# Patient Record
Sex: Female | Born: 1970 | Race: Black or African American | Hispanic: No | Marital: Single | Smoking: Never smoker
Health system: Southern US, Community
[De-identification: ages and names within clinical notes are randomized; demographics above are authoritative.]

## PROBLEM LIST (undated history)

## (undated) DIAGNOSIS — I10 Essential (primary) hypertension: Secondary | ICD-10-CM

## (undated) DIAGNOSIS — E119 Type 2 diabetes mellitus without complications: Secondary | ICD-10-CM

## (undated) HISTORY — PX: CHOLECYSTECTOMY: SHX55

## (undated) HISTORY — PX: TUBAL LIGATION: SHX77

---

## 1998-01-16 ENCOUNTER — Ambulatory Visit (HOSPITAL_COMMUNITY): Admission: RE | Admit: 1998-01-16 | Discharge: 1998-01-16 | Payer: Self-pay | Admitting: *Deleted

## 1999-06-10 ENCOUNTER — Other Ambulatory Visit: Admission: RE | Admit: 1999-06-10 | Discharge: 1999-06-10 | Payer: Self-pay | Admitting: Obstetrics & Gynecology

## 2001-04-12 ENCOUNTER — Encounter: Admission: RE | Admit: 2001-04-12 | Discharge: 2001-04-12 | Payer: Self-pay | Admitting: Family Medicine

## 2001-04-27 ENCOUNTER — Encounter: Admission: RE | Admit: 2001-04-27 | Discharge: 2001-04-27 | Payer: Self-pay | Admitting: Family Medicine

## 2001-08-30 ENCOUNTER — Encounter: Admission: RE | Admit: 2001-08-30 | Discharge: 2001-08-30 | Payer: Self-pay | Admitting: Family Medicine

## 2001-09-08 ENCOUNTER — Encounter: Admission: RE | Admit: 2001-09-08 | Discharge: 2001-09-08 | Payer: Self-pay | Admitting: Family Medicine

## 2002-04-10 ENCOUNTER — Other Ambulatory Visit: Admission: RE | Admit: 2002-04-10 | Discharge: 2002-04-10 | Payer: Self-pay | Admitting: Family Medicine

## 2002-04-10 ENCOUNTER — Encounter: Admission: RE | Admit: 2002-04-10 | Discharge: 2002-04-10 | Payer: Self-pay | Admitting: Family Medicine

## 2002-07-27 ENCOUNTER — Encounter: Admission: RE | Admit: 2002-07-27 | Discharge: 2002-07-27 | Payer: Self-pay | Admitting: Family Medicine

## 2002-08-21 ENCOUNTER — Encounter: Admission: RE | Admit: 2002-08-21 | Discharge: 2002-08-21 | Payer: Self-pay | Admitting: Family Medicine

## 2004-01-30 ENCOUNTER — Encounter: Admission: RE | Admit: 2004-01-30 | Discharge: 2004-01-30 | Payer: Self-pay | Admitting: Family Medicine

## 2004-11-06 ENCOUNTER — Other Ambulatory Visit: Admission: RE | Admit: 2004-11-06 | Discharge: 2004-11-06 | Payer: Self-pay | Admitting: Family Medicine

## 2004-11-06 ENCOUNTER — Ambulatory Visit: Payer: Self-pay | Admitting: Family Medicine

## 2005-09-23 ENCOUNTER — Ambulatory Visit: Payer: Self-pay | Admitting: Sports Medicine

## 2005-10-21 ENCOUNTER — Ambulatory Visit: Payer: Self-pay | Admitting: Sports Medicine

## 2005-10-28 ENCOUNTER — Encounter (INDEPENDENT_AMBULATORY_CARE_PROVIDER_SITE_OTHER): Payer: Self-pay | Admitting: *Deleted

## 2005-11-24 ENCOUNTER — Other Ambulatory Visit: Admission: RE | Admit: 2005-11-24 | Discharge: 2005-11-24 | Payer: Self-pay | Admitting: Family Medicine

## 2005-11-24 ENCOUNTER — Ambulatory Visit: Payer: Self-pay | Admitting: Family Medicine

## 2005-12-24 ENCOUNTER — Ambulatory Visit: Payer: Self-pay | Admitting: Family Medicine

## 2005-12-29 ENCOUNTER — Ambulatory Visit (HOSPITAL_COMMUNITY): Admission: RE | Admit: 2005-12-29 | Discharge: 2005-12-29 | Payer: Self-pay | Admitting: Family Medicine

## 2006-11-23 ENCOUNTER — Ambulatory Visit: Payer: Self-pay | Admitting: Family Medicine

## 2006-11-24 DIAGNOSIS — E669 Obesity, unspecified: Secondary | ICD-10-CM

## 2006-11-25 ENCOUNTER — Encounter (INDEPENDENT_AMBULATORY_CARE_PROVIDER_SITE_OTHER): Payer: Self-pay | Admitting: *Deleted

## 2006-12-28 ENCOUNTER — Encounter (INDEPENDENT_AMBULATORY_CARE_PROVIDER_SITE_OTHER): Payer: Self-pay | Admitting: Specialist

## 2006-12-28 ENCOUNTER — Ambulatory Visit: Payer: Self-pay | Admitting: Family Medicine

## 2006-12-28 ENCOUNTER — Other Ambulatory Visit: Admission: RE | Admit: 2006-12-28 | Discharge: 2006-12-28 | Payer: Self-pay | Admitting: Family Medicine

## 2006-12-28 ENCOUNTER — Encounter (INDEPENDENT_AMBULATORY_CARE_PROVIDER_SITE_OTHER): Payer: Self-pay | Admitting: Family Medicine

## 2006-12-28 DIAGNOSIS — I10 Essential (primary) hypertension: Secondary | ICD-10-CM

## 2006-12-28 LAB — CONVERTED CEMR LAB: GC Probe Amp, Genital: NEGATIVE

## 2006-12-29 ENCOUNTER — Encounter (INDEPENDENT_AMBULATORY_CARE_PROVIDER_SITE_OTHER): Payer: Self-pay | Admitting: Family Medicine

## 2007-01-17 ENCOUNTER — Encounter (INDEPENDENT_AMBULATORY_CARE_PROVIDER_SITE_OTHER): Payer: Self-pay | Admitting: Family Medicine

## 2007-02-07 ENCOUNTER — Ambulatory Visit: Payer: Self-pay | Admitting: Family Medicine

## 2007-02-07 ENCOUNTER — Telehealth: Payer: Self-pay | Admitting: *Deleted

## 2007-02-07 DIAGNOSIS — D259 Leiomyoma of uterus, unspecified: Secondary | ICD-10-CM

## 2007-02-08 ENCOUNTER — Encounter (INDEPENDENT_AMBULATORY_CARE_PROVIDER_SITE_OTHER): Payer: Self-pay | Admitting: Family Medicine

## 2007-02-10 ENCOUNTER — Encounter: Admission: RE | Admit: 2007-02-10 | Discharge: 2007-02-10 | Payer: Self-pay | Admitting: Family Medicine

## 2007-02-17 ENCOUNTER — Telehealth (INDEPENDENT_AMBULATORY_CARE_PROVIDER_SITE_OTHER): Payer: Self-pay | Admitting: Family Medicine

## 2007-03-15 ENCOUNTER — Ambulatory Visit: Payer: Self-pay | Admitting: Family Medicine

## 2007-06-08 ENCOUNTER — Emergency Department (HOSPITAL_COMMUNITY): Admission: EM | Admit: 2007-06-08 | Discharge: 2007-06-08 | Payer: Self-pay | Admitting: Emergency Medicine

## 2008-01-08 ENCOUNTER — Other Ambulatory Visit: Admission: RE | Admit: 2008-01-08 | Discharge: 2008-01-08 | Payer: Self-pay | Admitting: Family Medicine

## 2008-01-08 ENCOUNTER — Encounter: Payer: Self-pay | Admitting: Family Medicine

## 2008-01-08 ENCOUNTER — Ambulatory Visit: Payer: Self-pay | Admitting: Sports Medicine

## 2008-01-09 DIAGNOSIS — E739 Lactose intolerance, unspecified: Secondary | ICD-10-CM

## 2008-01-09 DIAGNOSIS — D509 Iron deficiency anemia, unspecified: Secondary | ICD-10-CM | POA: Insufficient documentation

## 2008-01-09 LAB — CONVERTED CEMR LAB
CO2: 24 meq/L (ref 19–32)
Cholesterol: 171 mg/dL (ref 0–200)
Creatinine, Ser: 0.81 mg/dL (ref 0.40–1.20)
HDL: 51 mg/dL (ref >39)
Hemoglobin: 10.4 g/dL — ABNORMAL LOW (ref 12.0–15.0)
MCHC: 32.7 g/dL (ref 30.0–36.0)
MCV: 76.1 fL — ABNORMAL LOW (ref 78.0–100.0)
Platelets: 367 10*3/uL (ref 150–400)
Potassium: 4.4 meq/L (ref 3.5–5.3)
RBC: 4.18 M/uL (ref 3.87–5.11)
RDW: 15.9 % — ABNORMAL HIGH (ref 11.5–15.5)
Total CHOL/HDL Ratio: 3.4
VLDL: 14 mg/dL (ref 0–40)
WBC: 7.5 10*3/uL (ref 4.0–10.5)

## 2008-01-11 ENCOUNTER — Encounter: Payer: Self-pay | Admitting: Family Medicine

## 2008-01-11 LAB — CONVERTED CEMR LAB: Pap Smear: NORMAL

## 2008-01-12 ENCOUNTER — Encounter: Admission: RE | Admit: 2008-01-12 | Discharge: 2008-01-12 | Payer: Self-pay | Admitting: Family Medicine

## 2008-01-12 ENCOUNTER — Telehealth: Payer: Self-pay | Admitting: Family Medicine

## 2008-01-16 ENCOUNTER — Encounter: Payer: Self-pay | Admitting: Family Medicine

## 2008-02-13 ENCOUNTER — Ambulatory Visit: Payer: Self-pay | Admitting: Family Medicine

## 2008-02-13 ENCOUNTER — Encounter: Payer: Self-pay | Admitting: Family Medicine

## 2008-02-22 ENCOUNTER — Encounter: Payer: Self-pay | Admitting: Family Medicine

## 2008-09-03 ENCOUNTER — Telehealth: Payer: Self-pay | Admitting: *Deleted

## 2008-09-04 ENCOUNTER — Ambulatory Visit: Payer: Self-pay | Admitting: Family Medicine

## 2008-09-17 ENCOUNTER — Ambulatory Visit: Payer: Self-pay | Admitting: Family Medicine

## 2008-10-03 ENCOUNTER — Ambulatory Visit: Payer: Self-pay | Admitting: Family Medicine

## 2009-01-24 ENCOUNTER — Encounter: Payer: Self-pay | Admitting: Family Medicine

## 2009-01-24 ENCOUNTER — Ambulatory Visit: Payer: Self-pay | Admitting: Family Medicine

## 2009-01-24 LAB — CONVERTED CEMR LAB
Blood in Urine, dipstick: NEGATIVE
GC Probe Amp, Genital: NEGATIVE
Glucose, Urine, Semiquant: NEGATIVE
Hgb A1c MFr Bld: 6.3 %
Nitrite: NEGATIVE
Protein, U semiquant: NEGATIVE
Specific Gravity, Urine: >=1.03
Urobilinogen, UA: 0.2

## 2009-02-12 ENCOUNTER — Encounter: Payer: Self-pay | Admitting: Family Medicine

## 2009-02-12 ENCOUNTER — Other Ambulatory Visit: Admission: RE | Admit: 2009-02-12 | Discharge: 2009-02-12 | Payer: Self-pay | Admitting: Family Medicine

## 2009-02-12 ENCOUNTER — Ambulatory Visit: Payer: Self-pay | Admitting: Family Medicine

## 2009-02-13 LAB — CONVERTED CEMR LAB
AST: 10 units/L (ref 0–37)
Alkaline Phosphatase: 51 units/L (ref 39–117)
BUN: 11 mg/dL (ref 6–23)
Chloride: 105 meq/L (ref 96–112)
Creatinine, Ser: 0.89 mg/dL (ref 0.40–1.20)
Glucose, Bld: 96 mg/dL (ref 70–99)
Sodium: 140 meq/L (ref 135–145)
Total Protein: 7.5 g/dL (ref 6.0–8.3)

## 2009-07-21 ENCOUNTER — Telehealth: Payer: Self-pay | Admitting: Family Medicine

## 2009-08-08 ENCOUNTER — Telehealth: Payer: Self-pay | Admitting: *Deleted

## 2009-10-16 ENCOUNTER — Encounter: Payer: Self-pay | Admitting: Family Medicine

## 2009-10-16 ENCOUNTER — Ambulatory Visit: Payer: Self-pay | Admitting: Family Medicine

## 2009-10-16 DIAGNOSIS — N760 Acute vaginitis: Secondary | ICD-10-CM | POA: Insufficient documentation

## 2009-10-16 LAB — CONVERTED CEMR LAB
Nitrite: NEGATIVE
Protein, U semiquant: NEGATIVE
WBC Urine, dipstick: NEGATIVE
Whiff Test: NEGATIVE
pH: 5.5

## 2010-03-03 ENCOUNTER — Ambulatory Visit: Payer: Self-pay | Admitting: Family Medicine

## 2010-03-03 ENCOUNTER — Encounter: Payer: Self-pay | Admitting: Family Medicine

## 2010-03-03 DIAGNOSIS — R5381 Other malaise: Secondary | ICD-10-CM

## 2010-03-03 DIAGNOSIS — R5383 Other fatigue: Secondary | ICD-10-CM

## 2010-03-03 LAB — CONVERTED CEMR LAB
AST: 10 units/L (ref 0–37)
Alkaline Phosphatase: 57 units/L (ref 39–117)
BUN: 16 mg/dL (ref 6–23)
CO2: 26 meq/L (ref 19–32)
Chlamydia, DNA Probe: NEGATIVE
Chloride: 97 meq/L (ref 96–112)
Creatinine, Ser: 0.94 mg/dL (ref 0.40–1.20)
Glucose, Bld: 143 mg/dL — ABNORMAL HIGH (ref 70–99)
TSH: 1.364 microintl units/mL (ref 0.350–4.500)
Total Protein: 8 g/dL (ref 6.0–8.3)

## 2010-03-11 ENCOUNTER — Ambulatory Visit: Payer: Self-pay | Admitting: Family Medicine

## 2010-09-11 ENCOUNTER — Encounter: Payer: Self-pay | Admitting: Family Medicine

## 2010-10-29 NOTE — Assessment & Plan Note (Signed)
Summary: CPE/KH   Vital Signs:  Patient profile:   40 year old female Height:      64 inches Weight:      244.9 pounds BMI:     42.19 Temp:     98.5 degrees F oral Pulse rate:   85 / minute BP sitting:   124 / 85  (left arm) Cuff size:   large  Vitals Entered By: Garen Grams LPN (March 03, 1609 3:31 PM) CC: cpe Is Patient Diabetic? No Pain Assessment Patient in pain? no        Primary Care Provider:  Clementeen Graham MD  CC:  cpe.  History of Present Illness: Ms Geidel presents to clinic today for her yearly exam.    GYN: Ms Brewbaker has had more than 3 yearly paps in a row that were normal. She has neevr had an abnormal pap smear. She is OK with every 3 year paps.  Ms jenay morici like GC/CL and HIV testing at today's visit.   General: Ms Frommer notes fatigue and wonders if her iron level may be low. She has had a history of iron defiency anemia in the past.  She is agreeable to checking iron as well as TSH today.  Weight: Ms Drewry notes that she is obese.  She would like to try to lose weight.  She is interested in a visit dedicated to weight loss.   Habits & Providers  Alcohol-Tobacco-Diet     Tobacco Status: never  Exercise-Depression-Behavior     Does Patient Exercise: no  Current Problems (verified): 1)  Physical Examination  (ICD-V70.0) 2)  Fatigue  (ICD-780.79) 3)  Vaginitis  (ICD-616.10) 4)  Sexually Transmitted Disease, Exposure To  (ICD-V01.6) 5)  Screening For Malignant Neoplasm of The Cervix  (ICD-V76.2) 6)  Hypertension, Benign  (ICD-401.1) 7)  Obesity, Nos  (ICD-278.00) 8)  Anemia, Iron Deficiency  (ICD-280.9) 9)  Leiomyoma, Uterus  (ICD-218.9) 10)  Impaired Glucose Tolerance  (ICD-271.3)  Current Medications (verified): 1)  Zestoretic 20-12.5 Mg Tabs (Lisinopril-Hydrochlorothiazide) .... Take 2 Tablets By Mouth Once A Day 2)  Iron Supplement 325 (65 Fe) Mg  Tabs (Ferrous Sulfate) .Marland Kitchen.. 1 Tab By Mouth Three Times A Day 3)  Aspirin Ec Low Dose 81 Mg  Tbec (Aspirin) .Marland Kitchen.. 1 Tab By Mouth Daily  Allergies (verified): No Known Drug Allergies  Past History:  Past Medical History: HTN Fibroids 3 normal paps in a row --> q3 year paps.  Past Surgical History: Uterine Biopsy 2009 WNL  Family History: Reviewed history from 11/24/2006 and no changes required. 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  Review of Systems  The patient denies anorexia, fever, weight loss, weight gain, vision loss, hoarseness, chest pain, syncope, dyspnea on exertion, peripheral edema, headaches, abdominal pain, hematochezia, severe indigestion/heartburn, genital sores, muscle weakness, suspicious skin lesions, difficulty walking, depression, and breast masses.    Physical Exam  General:  VS noted. Obese woman in NAD Eyes:  EOMI, PEERL, Conjunctiva, sclera and lids normal Mouth:  Oral mucosa and oropharynx without lesions or exudates.  Teeth in good repair. Lungs:  Normal respiratory effort, chest expands symmetrically. Lungs are clear to auscultation, no crackles or wheezes. Heart:  Normal rate and regular rhythm. S1 and S2 normal without gallop, murmur, click, rub or other extra sounds. Abdomen:  Bowel sounds positive,abdomen, obese  soft and non-tender without masses, organomegaly or hernias noted. Genitalia:  Normal introitus for age, no external lesions, no vaginal discharge, mucosa pink and moist, no vaginal or cervical lesions, no vaginal atrophy, no friaility or hemorrhage, normal uterus size and position, no adnexal masses or tenderness Extremities:  No clubbing, cyanosis, edema, or deformity noted with normal full range of motion of all joints.   Skin:  Intact without suspicious lesions or rashes Cervical Nodes:  No lymphadenopathy noted Axillary Nodes:  No palpable  lymphadenopathy   Impression & Recommendations:  Problem # 1:  PHYSICAL EXAMINATION (ICD-V70.0) Assessment Unchanged Well obese woman.  Pap deferred today as pt is on q3 year schedule. GC/CL, HIV obtained per patient request.  Orders: [facility] - Est  18-39 yrs ([id number])  Problem # 2:  FATIGUE (ICD-780.79) Assessment: New Mild not impairing work and life greatly. Will assess with TSH and Hb Orders: TSH-[facility] ([id number]) [facility] - Est  18-39 yrs ([id number])  Problem # 3:  OBESITY, NOS (ICD-278.00) Assessment: Unchanged Pt desires to lose weight. Will f/u with a dedicated visit.  Asked pt to bring a 3 day food diary. May referr to Dr. Ardeen Garland.   Orders: [facility] - Est  18-39 yrs ([id number])  Complete Medication List: 1)  Zestoretic 20-12.5 Mg Tabs (Lisinopril-hydrochlorothiazide) .... Take 2 tablets by mouth once a day 2)  Iron Supplement 325 (65 Fe) Mg Tabs (Ferrous sulfate) .Marland Kitchen.. 1 tab by mouth three times a day 3)  Aspirin Ec Low Dose 81 Mg Tbec (Aspirin) .Marland Kitchen.. 1 tab by mouth daily  Other Orders: Comp Met-[facility] [phone]) Hemoglobin-[facility] (27253) HIV-[facility] [id number]) GC/Chlamydia-[facility] ([id number]) GC/Chlamydia-[facility] ([id number])  Patient Instructions: 1)  Thank you for seeing me today. 2)  I will call you about your test results.  I will leave a message. 3)  You are due for a pap in 2 more years. 4)  You are due to a mamogram in 1 year.  5)  Please make an appointment with me to discuss weight loss.  Please reccord a 3 day food diary before the visit.  6)  Overall your health is good but we can tune it up!   Appended Document: hgb results  Laboratory Results   Blood Tests   Date/Time Received: March 03, 2010 4:27 PM  Date/Time Reported: March 03, 2010 4:47 PM     CBC   HGB:  11.0 g/dL   (Normal Range: 59.5-63.8 in Males, 12.0-15.0 in Females) Comments: ...........test performed by...........Marland KitchenTerese Door, CMA

## 2010-10-29 NOTE — Miscellaneous (Signed)
  Clinical Lists Changes  Problems: Removed problem of PHYSICAL EXAMINATION (ICD-V70.0) Removed problem of SEXUALLY TRANSMITTED DISEASE, EXPOSURE TO (ICD-V01.6) Removed problem of SCREENING FOR MALIGNANT NEOPLASM OF THE CERVIX (ICD-V76.2)

## 2010-10-29 NOTE — Assessment & Plan Note (Signed)
Summary: discuss weight loss/eo   Vital Signs:  Patient profile:   40 year old female Height:      64 inches Weight:      250 pounds BMI:     43.07 Temp:     98.3 degrees F oral Pulse rate:   72 / minute BP sitting:   147 / 87  (right arm) Cuff size:   large  Vitals Entered By: Becky Vazquez CMA (March 11, 2010 3:22 PM) CC: discuss weight loss Is Patient Diabetic? No Pain Assessment Patient in pain? no        Primary Care Provider:  Clementeen Graham MD  CC:  discuss weight loss.  History of Present Illness: Becky Vazquez presents to clinic today to discuss weight loss. She feels ready to lose weight and has tried and failed in the past.  She does not exercise much however she does have a sister who frequently walks for exercise.  3 days food diary:  Sunday: Egg and cheese biscuit from [employer] with a sweet tea No lunch Dinner have 2 hotdogs and 1 piece of cake and 1 regular soda   Monday: No breakfast Lunch: None but had a large sweet tea.  Dinner: [employer] with cheese and ranch dressing Orange Cr[employer] soda  Tuesday: scrambled eggs with cheese and 3 slices of bacon. 1 bottle of mountain dew Lunch: Chinese [employer] wings (8) with water Dinner: Whopper with cheese and a doctor pepper.     Habits & Providers  Alcohol-Tobacco-Diet     Tobacco Status: never  Current Problems (verified): 1)  Physical Examination  (ICD-V70.0) 2)  Fatigue  (ICD-780.79) 3)  Vaginitis  (ICD-616.10) 4)  Sexually Transmitted Disease, Exposure To  (ICD-V01.6) 5)  Screening For Malignant Neoplasm of The Cervix  (ICD-V76.2) 6)  Hypertension, Benign  (ICD-401.1) 7)  Obesity, Nos  (ICD-278.00) 8)  Anemia, Iron Deficiency  (ICD-280.9) 9)  Leiomyoma, Uterus  (ICD-218.9) 10)  Impaired Glucose Tolerance  (ICD-271.3)  Current Medications (verified): 1)  Zestoretic 20-12.5 Mg Tabs (Lisinopril-Hydrochlorothiazide) .... Take 2 Tablets By Mouth Once A Day 2)  Iron Supplement 325 (65  Fe) Mg  Tabs (Ferrous Sulfate) .Marland Kitchen.. 1 Tab By Mouth Three Times A Day 3)  Aspirin Ec Low Dose 81 Mg Tbec (Aspirin) .Marland Kitchen.. 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 No exercise poor diet.  Review of Systems  The patient denies chest pain, syncope, dyspnea on exertion, abdominal pain, severe indigestion/heartburn, difficulty walking, and unusual weight change.    Physical Exam  General:  VS noted. Obese female in NAD   Impression & Recommendations:  Problem # 1:  OBESITY, NOS (ICD-278.00) Assessment Unchanged  Spent >25 minutes with this patient discussing weight loss plan. Pt recognised that her diet has room for improvement. 1) Skipped Meals 2) No fiber or fruits of vegitables 3) High Fat food   Also noted that pt can increase exercise.  Plan: Try not to skip meals.  Try to eat breakfast daily (oatmeal).  Also try to have 1  serving of fruit or veggitable with every meal. Avoid non-diet drinks. Pefer to drink water.  Will try to start walking with sister and do 30 mins of walking msot days of the week with no more than 2 skipped days. Will make appt with Dr. Ardeen Garland nutrition. Will f/u with me in 1 month.   Orders:  [facility]- Est Level  3 (16109)  Complete Medication List: 1)  Zestoretic 20-12.5 Mg Tabs (Lisinopril-hydrochlorothiazide) .... Take 2 tablets by mouth once a day 2)  Iron Supplement 325 (65 Fe) Mg Tabs (Ferrous sulfate) .Marland Kitchen.. 1 tab by mouth three times a day 3)  Aspirin Ec Low Dose 81 Mg Tbec (Aspirin) .Marland Kitchen.. 1 tab by mouth daily  Patient Instructions: 1)  Thank you for seeing me today. 2)  Please schedule an appointment with Dr. Gerilyn Pilgrim  3)  Please try to eat breakfast every day. 4)  Try not to skip meals. 5)  Try to eat 1 piece of fruit or veggies with every meal. 6)  Try to drink Water, or diet or non sweet drinks with meals.  7)   Please start walking with your sister.  Try to get 30 minutes most days of the week not skipping more than 2 days in a row.  8)  And park at the back of the parking lot.  9)  Please schedule a follow-up appointment in 1 month.

## 2010-10-29 NOTE — Assessment & Plan Note (Signed)
Summary: discharge & oder with urination,tcb   Vital Signs:  Patient profile:   40 year old female Weight:      243 pounds Temp:     98 degrees F oral Pulse rate:   82 / minute Pulse rhythm:   regular BP sitting:   135 / 89  (left arm) Cuff size:   large  Vitals Entered By: Loralee Pacas CMA (October 16, 2009 9:58 AM) Comments pt states that her urine smells as if she were on PCN, she noticed the discharge after her period about 1 week.  the discharge has been clear and has some itching.  states that she would like to have GC/Chly done, asked if she was having protected sex she said yes and with the same partner.   Primary Care Provider:  Norton Blizzard MD   History of Present Illness: 40 yo female who often gets yeast infections after menses used 1 day rx of yeast med with some relief last week.  Still with some clear discharge, mild itching no pain.  Urine has strong odor (like penicillin) for about 1 week.  Worse in am.  NO burning, no back pain.  No fevers/chills.     Would like to be checked for STIs.  Not concerned particularly about partner. Has had BTL.    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  She agrees to return on a Friday for a flu shot (wants to wait in case she feels ill afterwards so she will feel bad on a weekend, not weekday).  Habits & Providers  Alcohol-Tobacco-Diet     Tobacco Status: never  Current Medications (verified): 1)  Zestoretic 20-12.5 Mg Tabs (Lisinopril-Hydrochlorothiazide) .... Take 2 Tablets By Mouth Once A Day 2)  Iron Supplement 325 (65 Fe) Mg  Tabs (Ferrous Sulfate) .Marland Kitchen.. 1 Tab By Mouth Three Times A Day 3)  Aspirin Ec Low Dose 81 Mg Tbec (Aspirin) .Marland Kitchen.. 1 Tab By Mouth Daily  Allergies (verified): No Known Drug Allergies  Past History:  Past Medical History: Reviewed history from 01/08/2008 and no changes required. 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  Review of Systems       see HPI  Physical Exam  General:  Obese.  No distress.  vitals noted. Genitalia:  Nl external female.  No lesions.  +white, thick d/c noted in vaginal vault.  Nl rugae.  Cervix without leasions or discharge.  Nl uterus without CMT. No adnexal TTP or fullness appreciated, but exam limited by body habitus.   Impression & Recommendations:  Problem # 1:  VAGINITIS (ICD-616.10)  Wet prep neg, but symptoms and exam consistent with yeast.  Will rx with fluconazole and f/u if needed.  Orders: [facility]- Est Level  3 ([id number])  Problem # 2:  SEXUALLY TRANSMITTED DISEASE, EXPOSURE TO (ICD-V01.6) Pt requests testing.  Reviewed use of condoms for protection from STIs Orders: HIV-[facility] ([id number]) RPR-[facility] [id number]) [facility]- Est Level  3 ([id number])  Complete Medication List: 1)  Zestoretic 20-12.5 Mg Tabs (Lisinopril-hydrochlorothiazide) .... Take 2 tablets by mouth once a day 2)  Iron Supplement 325 (65 Fe) Mg Tabs (Ferrous sulfate) .Marland Kitchen.. 1 tab by mouth three times a day 3)  Aspirin Ec Low Dose 81 Mg Tbec (Aspirin) .Marland Kitchen.. 1 tab by mouth daily 4)  Fluconazole 150 Mg Tabs (Fluconazole) .... Take 1  by mouth now.  repeat in 72 hours if still having symptoms.  Other Orders: Wet Prep- [facility] [phone]) Urinalysis-[facility] (00000) GC/Chlamydia-[facility] ([id number]) Influenza Vaccine NON MCR (95621)  Patient Instructions: 1)  Please schedule a nurse visit for your flu shot.  2)  We will call you if there are any problems with your lab work. Prescriptions: FLUCONAZOLE 150 MG TABS (FLUCONAZOLE) Take 1 by mouth now.  Repeat in 72 hours if still having symptoms.  #2 x 0   Entered and Authorized by:   Cleaster Shiffer [place] MD   Signed by:   Angelynn Lemus [place] MD on 10/16/2009   Method used:   Electronically to        [facility] (retail)       [address]       [place], [place]   [id number]       Ph: [phone]       Fax: [phone]   RxID:   [id number]   Laboratory Results   Urine Tests  Date/Time Received: October 16, 2009 10:08 AM  Date/Time Reported: October 16, 2009 11:03 AM   Routine Urinalysis   Color: yellow Appearance: Clear Glucose: negative   (Normal Range: Negative) Bilirubin: negative   (Normal Range: Negative) Ketone: negative   (Normal Range: Negative) Spec. Gravity: 1.025   (Normal Range: 1.003-1.035) Blood: negative   (Normal Range: Negative) pH: 5.5   (Normal Range: 5.0-8.0) Protein: negative   (Normal Range: Negative) Urobilinogen: 1.0   (Normal Range: 0-1) Nitrite: negative   (Normal Range: Negative) Leukocyte Esterace: negative   (Normal Range: Negative)    Comments: ...........test performed by...........Marland KitchenTerese Door, CMA  Date/Time Received: October 16, 2009 10:08 AM  Date/Time Reported: October 16, 2009 11:00 AM    Wet [place] Source: vaginal WBC/hpf: 0-2 Bacteria/hpf: 3+  Rods Clue cells/hpf: none  Negative whiff Yeast/hpf: none Trichomonas/hpf: none Comments: cocci bacteria also present ...........test performed by...........Marland KitchenTerese Door, CMA      Immunizations Administered:  Influenza Vaccine # 1:    Vaccine Type: Fluvax Non-MCR    Site: left deltoid    Mfr: GlaxoSmithKline    Dose: 0.5 ml    Route: IM    Given by: Loralee Pacas CMA    Exp. Date: 03/26/2010    Lot #: [id number]    VIS given: 05/06/2009  Flu Vaccine Consent Questions:    Do you have a history of severe allergic reactions to this vaccine? no    Any prior history of allergic reactions to egg and/or gelatin? no    Do you have a sensitivity to the preservative Thimersol? no    Do you have a past history of Guillan-Barre Syndrome? no    Do you currently have an acute febrile illness? no    Have you ever had a severe reaction to latex? no    Vaccine information given and explained to patient? yes    Are you currently pregnant?  no

## 2011-01-12 ENCOUNTER — Other Ambulatory Visit: Payer: Self-pay | Admitting: Family Medicine

## 2011-02-02 ENCOUNTER — Emergency Department (HOSPITAL_COMMUNITY)
Admission: EM | Admit: 2011-02-02 | Discharge: 2011-02-02 | Disposition: A | Discharge status: Home / Self Care | Payer: Self-pay | Attending: Emergency Medicine | Admitting: Emergency Medicine

## 2011-02-02 DIAGNOSIS — I10 Essential (primary) hypertension: Secondary | ICD-10-CM | POA: Insufficient documentation

## 2011-02-02 DIAGNOSIS — M549 Dorsalgia, unspecified: Secondary | ICD-10-CM | POA: Insufficient documentation

## 2011-02-10 ENCOUNTER — Other Ambulatory Visit: Payer: Self-pay | Admitting: Family Medicine

## 2011-02-10 NOTE — Telephone Encounter (Signed)
Refill request

## 2011-02-11 ENCOUNTER — Telehealth: Payer: Self-pay | Admitting: Family Medicine

## 2011-02-11 NOTE — Telephone Encounter (Signed)
eRx was sent by Denyse Amass on 5/16.   Please let pt know Thanks LC

## 2011-02-11 NOTE — Telephone Encounter (Signed)
States she called [facility] last month and again on Monday (5/14) to have her Lisinopril/HCTZ filled and they told her that they have not had a response yet.  Please advise.  (I did see in [facility] that a request was sent in in march, but sent to [facility])

## 2011-06-28 ENCOUNTER — Other Ambulatory Visit (HOSPITAL_COMMUNITY): Payer: Self-pay | Admitting: Internal Medicine

## 2011-06-28 DIAGNOSIS — Z1231 Encounter for screening mammogram for malignant neoplasm of breast: Secondary | ICD-10-CM

## 2011-07-02 ENCOUNTER — Ambulatory Visit (HOSPITAL_COMMUNITY)
Admission: RE | Admit: 2011-07-02 | Discharge: 2011-07-02 | Disposition: A | Discharge status: Home / Self Care | Payer: Self-pay | Source: Ambulatory Visit | Attending: Internal Medicine | Admitting: Internal Medicine

## 2011-07-02 DIAGNOSIS — Z1231 Encounter for screening mammogram for malignant neoplasm of breast: Secondary | ICD-10-CM

## 2011-10-21 ENCOUNTER — Other Ambulatory Visit: Payer: Self-pay | Admitting: Family Medicine

## 2011-10-22 NOTE — Telephone Encounter (Signed)
Refill request

## 2011-11-19 ENCOUNTER — Other Ambulatory Visit: Payer: Self-pay | Admitting: Obstetrics and Gynecology

## 2012-06-13 ENCOUNTER — Inpatient Hospital Stay (HOSPITAL_COMMUNITY)
Admission: EM | Admit: 2012-06-13 | Discharge: 2012-06-17 | DRG: 392 | Disposition: A | Discharge status: Home / Self Care | Payer: MEDICAID | Attending: Family Medicine | Admitting: Family Medicine

## 2012-06-13 ENCOUNTER — Encounter (HOSPITAL_COMMUNITY): Payer: Self-pay | Admitting: *Deleted

## 2012-06-13 ENCOUNTER — Emergency Department (HOSPITAL_COMMUNITY): Payer: Self-pay

## 2012-06-13 DIAGNOSIS — E860 Dehydration: Secondary | ICD-10-CM | POA: Diagnosis present

## 2012-06-13 DIAGNOSIS — I10 Essential (primary) hypertension: Secondary | ICD-10-CM | POA: Diagnosis present

## 2012-06-13 DIAGNOSIS — K529 Noninfective gastroenteritis and colitis, unspecified: Principal | ICD-10-CM | POA: Diagnosis present

## 2012-06-13 DIAGNOSIS — E876 Hypokalemia: Secondary | ICD-10-CM | POA: Diagnosis present

## 2012-06-13 DIAGNOSIS — R112 Nausea with vomiting, unspecified: Secondary | ICD-10-CM

## 2012-06-13 DIAGNOSIS — D649 Anemia, unspecified: Secondary | ICD-10-CM | POA: Diagnosis present

## 2012-06-13 DIAGNOSIS — R109 Unspecified abdominal pain: Secondary | ICD-10-CM | POA: Diagnosis present

## 2012-06-13 DIAGNOSIS — K5900 Constipation, unspecified: Secondary | ICD-10-CM

## 2012-06-13 HISTORY — DX: Essential (primary) hypertension: I10

## 2012-06-13 LAB — URINALYSIS, ROUTINE W REFLEX MICROSCOPIC
Glucose, UA: NEGATIVE mg/dL
Ketones, ur: 15 mg/dL — AB
Protein, ur: 100 mg/dL — AB

## 2012-06-13 LAB — CBC WITH DIFFERENTIAL/PLATELET
Basophils Absolute: 0 10*3/uL (ref 0.0–0.1)
Basophils Relative: 0 % (ref 0–1)
Eosinophils Absolute: 0 10*3/uL (ref 0.0–0.7)
Eosinophils Relative: 0 % (ref 0–5)
Lymphs Abs: 1.6 10*3/uL (ref 0.7–4.0)
MCHC: 34.7 g/dL (ref 30.0–36.0)
MCV: 79.2 fL (ref 78.0–100.0)
Monocytes Relative: 2 % — ABNORMAL LOW (ref 3–12)
Platelets: 445 10*3/uL — ABNORMAL HIGH (ref 150–400)
RDW: 14 % (ref 11.5–15.5)
WBC: 14.2 10*3/uL — ABNORMAL HIGH (ref 4.0–10.5)

## 2012-06-13 LAB — HEPATIC FUNCTION PANEL
ALT: 6 U/L (ref 0–35)
AST: 11 U/L (ref 0–37)
Alkaline Phosphatase: 52 U/L (ref 39–117)
Bilirubin, Direct: 0.1 mg/dL (ref 0.0–0.3)
Total Bilirubin: 0.3 mg/dL (ref 0.3–1.2)

## 2012-06-13 LAB — BASIC METABOLIC PANEL
CO2: 26 mEq/L (ref 19–32)
Calcium: 10 mg/dL (ref 8.4–10.5)
Creatinine, Ser: 0.95 mg/dL (ref 0.50–1.10)

## 2012-06-13 LAB — URINE MICROSCOPIC-ADD ON

## 2012-06-13 MED ORDER — PANTOPRAZOLE SODIUM 40 MG IV SOLR
40.0000 mg | Freq: Once | INTRAVENOUS | Status: AC
  Administered 2012-06-13: 40 mg via INTRAVENOUS
  Filled 2012-06-13: qty 40

## 2012-06-13 MED ORDER — ONDANSETRON HCL 4 MG/2ML IJ SOLN
4.0000 mg | Freq: Once | INTRAMUSCULAR | Status: AC
  Administered 2012-06-13: 4 mg via INTRAVENOUS
  Filled 2012-06-13: qty 2

## 2012-06-13 MED ORDER — ONDANSETRON HCL 4 MG/2ML IJ SOLN
4.0000 mg | Freq: Once | INTRAMUSCULAR | Status: AC
  Administered 2012-06-14: 4 mg via INTRAVENOUS
  Filled 2012-06-13: qty 2

## 2012-06-13 MED ORDER — FENTANYL CITRATE 0.05 MG/ML IJ SOLN
50.0000 ug | Freq: Once | INTRAMUSCULAR | Status: AC
  Administered 2012-06-13: 50 ug via INTRAVENOUS
  Filled 2012-06-13: qty 2

## 2012-06-13 MED ORDER — SODIUM CHLORIDE 0.9 % IV BOLUS (SEPSIS)
1000.0000 mL | Freq: Once | INTRAVENOUS | Status: AC
  Administered 2012-06-13: 1000 mL via INTRAVENOUS

## 2012-06-13 MED ORDER — HYDROMORPHONE HCL PF 1 MG/ML IJ SOLN
1.0000 mg | Freq: Once | INTRAMUSCULAR | Status: AC
  Administered 2012-06-13: 1 mg via INTRAVENOUS
  Filled 2012-06-13: qty 1

## 2012-06-13 MED ORDER — IOHEXOL 300 MG/ML  SOLN
100.0000 mL | Freq: Once | INTRAMUSCULAR | Status: AC | PRN
  Administered 2012-06-13: 100 mL via INTRAVENOUS

## 2012-06-13 MED ORDER — HYDROMORPHONE HCL PF 1 MG/ML IJ SOLN
1.0000 mg | Freq: Once | INTRAMUSCULAR | Status: AC
  Administered 2012-06-14: 1 mg via INTRAVENOUS
  Filled 2012-06-13: qty 1

## 2012-06-13 NOTE — ED Provider Notes (Addendum)
History     CSN: [id number]  Arrival date & time 06/13/12  1813   First MD Initiated Contact with Patient 06/13/12 1947      Chief Complaint  Patient presents with  . Abdominal Pain    (Consider location/radiation/quality/duration/timing/severity/associated sxs/prior treatment) Patient is a 41 y.o. female presenting with abdominal pain. The history is provided by the patient.  Abdominal Pain The primary symptoms of the illness include abdominal pain. The primary symptoms of the illness do not include fever, shortness of breath, diarrhea or dysuria.  Symptoms associated with the illness do not include chills, constipation or back pain.  pt c/o mid to upper abdominal pain onset this morning. Cramping, comes and goes. No specific exacerbating or alleviating factors. Not related to eating. No radiation, no back or flank pain. Denies hx same pain. No hx pud, gallstones or pancreatitis. Only prior abd surgery is tubal ligation. No vaginal discharge or bleeding. lnmp approximately 3 weeks ago. No dysuria or gu c/o. Intermittent nausea. No vomiting. Having normal bms.      Past Medical History  Diagnosis Date  . Hypertension     Past Surgical History  Procedure Date  . Tubal ligation     History reviewed. No pertinent family history.  History  Substance Use Topics  . Smoking status: Not on file  . Smokeless tobacco: Not on file  . Alcohol Use: No    OB History    Grav Para Term Preterm Abortions TAB SAB Ect Mult Living                  Review of Systems  Constitutional: Negative for fever and chills.  HENT: Negative for neck pain.   Eyes: Negative for redness.  Respiratory: Negative for cough and shortness of breath.   Cardiovascular: Negative for chest pain.  Gastrointestinal: Positive for abdominal pain. Negative for diarrhea and constipation.  Genitourinary: Negative for dysuria and flank pain.  Musculoskeletal: Negative for back pain.  Skin: Negative for rash.    Neurological: Negative for headaches.  Hematological: Does not bruise/bleed easily.  Psychiatric/Behavioral: Negative for confusion.    Allergies  Review of patient's allergies indicates no known allergies.  Home Medications   Current Outpatient Rx  Name Route Sig Dispense Refill  . FERROUS SULFATE 325 (65 FE) MG PO TABS Oral Take 325 mg by mouth daily with breakfast.     . IBUPROFEN 200 MG PO TABS Oral Take 400 mg by mouth every 6 (six) hours as needed.    Marland Kitchen LISINOPRIL-HYDROCHLOROTHIAZIDE 20-12.5 MG PO TABS  take 2 tablets by mouth once daily 64 tablet 3  . OMEPRAZOLE 20 MG PO CPDR Oral Take 20 mg by mouth daily.      BP 119/88  Pulse 110  Temp 98.3 F (36.8 C) (Oral)  Resp 22  SpO2 98%  LMP 05/30/2012  Physical Exam  Nursing note and vitals reviewed. Constitutional: She appears well-developed and well-nourished. No distress.  HENT:  Mouth/Throat: Oropharynx is clear and moist.  Eyes: Conjunctivae normal are normal. No scleral icterus.  Neck: Neck supple. No tracheal deviation present.  Cardiovascular: Normal rate, regular rhythm, normal heart sounds and intact distal pulses.   Pulmonary/Chest: Effort normal and breath sounds normal. No respiratory distress.  Abdominal: Soft. Normal appearance and bowel sounds are normal. She exhibits no distension and no mass. There is tenderness. There is no rebound and no guarding.       Moderate mid abd tenderness. No rebound or guarding. No hernia.  Genitourinary:       No cva tenderness  Musculoskeletal: She exhibits no edema.  Neurological: She is alert.  Skin: Skin is warm and dry. No rash noted.  Psychiatric: She has a normal mood and affect.    ED Course  Procedures (including critical care time)  Results for orders placed during the hospital encounter of 06/13/12  CBC WITH DIFFERENTIAL      Component Value Range   WBC 14.2 (*) 4.0 - 10.5 K/uL   RBC 4.99  3.87 - 5.11 MIL/uL   Hemoglobin 13.7  12.0 - 15.0 g/dL   HCT  16.1  09.6 - 04.5 %   MCV 79.2  78.0 - 100.0 fL   MCH 27.5  26.0 - 34.0 pg   MCHC 34.7  30.0 - 36.0 g/dL   RDW 40.9  81.1 - 91.4 %   Platelets 445 (*) 150 - 400 K/uL   Neutrophils Relative 86 (*) 43 - 77 %   Neutro Abs 12.2 (*) 1.7 - 7.7 K/uL   Lymphocytes Relative 11 (*) 12 - 46 %   Lymphs Abs 1.6  0.7 - 4.0 K/uL   Monocytes Relative 2 (*) 3 - 12 %   Monocytes Absolute 0.3  0.1 - 1.0 K/uL   Eosinophils Relative 0  0 - 5 %   Eosinophils Absolute 0.0  0.0 - 0.7 K/uL   Basophils Relative 0  0 - 1 %   Basophils Absolute 0.0  0.0 - 0.1 K/uL  BASIC METABOLIC PANEL      Component Value Range   Sodium 133 (*) 135 - 145 mEq/L   Potassium 3.5  3.5 - 5.1 mEq/L   Chloride 94 (*) 96 - 112 mEq/L   CO2 26  19 - 32 mEq/L   Glucose, Bld 161 (*) 70 - 99 mg/dL   BUN 11  6 - 23 mg/dL   Creatinine, Ser 7.82  0.50 - 1.10 mg/dL   Calcium 95.6  8.4 - 21.3 mg/dL   GFR calc non Af Amer 73 (*) >90 mL/min   GFR calc Af Amer 85 (*) >90 mL/min  URINALYSIS, ROUTINE W REFLEX MICROSCOPIC      Component Value Range   Color, Urine AMBER (*) YELLOW   APPearance TURBID (*) CLEAR   Specific Gravity, Urine 1.034 (*) 1.005 - 1.030   pH 5.5  5.0 - 8.0   Glucose, UA NEGATIVE  NEGATIVE mg/dL   Hgb urine dipstick NEGATIVE  NEGATIVE   Bilirubin Urine SMALL (*) NEGATIVE   Ketones, ur 15 (*) NEGATIVE mg/dL   Protein, ur 086 (*) NEGATIVE mg/dL   Urobilinogen, UA 1.0  0.0 - 1.0 mg/dL   Nitrite NEGATIVE  NEGATIVE   Leukocytes, UA SMALL (*) NEGATIVE  LIPASE, BLOOD      Component Value Range   Lipase 15  11 - 59 U/L  HEPATIC FUNCTION PANEL      Component Value Range   Total Protein 8.4 (*) 6.0 - 8.3 g/dL   Albumin 3.5  3.5 - 5.2 g/dL   AST 11  0 - 37 U/L   ALT 6  0 - 35 U/L   Alkaline Phosphatase 52  39 - 117 U/L   Total Bilirubin 0.3  0.3 - 1.2 mg/dL   Bilirubin, Direct 0.1  0.0 - 0.3 mg/dL   Indirect Bilirubin 0.2 (*) 0.3 - 0.9 mg/dL  PREGNANCY, URINE      Component Value Range   Preg Test, Ur NEGATIVE   NEGATIVE  URINE MICROSCOPIC-ADD ON      Component Value Range   Squamous Epithelial / LPF FEW (*) RARE   WBC, UA 3-6  <3 WBC/hpf   Bacteria, UA MANY (*) RARE   Urine-Other MUCOUS PRESENT     Ct Abdomen Pelvis W Contrast  06/13/2012  *RADIOLOGY REPORT*  Clinical Data: Abdominal pain.  CT ABDOMEN AND PELVIS WITH CONTRAST  Technique:  Multidetector CT imaging of the abdomen and pelvis was performed following the standard protocol during bolus administration of intravenous contrast.  Contrast: OMNIPAQUE IOHEXOL 300 MG/ML  SOLN  Comparison: None.  Findings: The lung bases are clear. There is cardiomegaly.  No pleural or pericardial effusion.  There are multiple loops of abnormal small bowel with wall thickening and hyperenhancement identified.  There is some perihepatic, mesenteric and free pelvic fluid.  No focal fluid collection is identified.  No pneumatosis, portal venous gas or free intraperitoneal air is seen.  The celiac axis and superior and inferior mesenteric arteries all opacified normally.  No notable vascular disease is identified.  The colon is largely decompressed but otherwise unremarkable.  The appendix appears normal.  The stomach is nearly completely decompressed but otherwise unremarkable.  The gallbladder, liver, spleen, adrenal glands, pancreas and left kidney appear normal.  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 No lymphadenopathy.  Fibroid uterus is noted.  Urinary bladder is unremarkable.  No focal bony abnormality.  IMPRESSION:  1.  Multiple abnormal loops of small bowel with scattered free abdominal fluid most consistent with infectious or inflammatory enteritis.  No finding to suggest ischemia is identified.  Note is made that the terminal ileum appears normal. 2.  Cardiomegaly. 3.  Fibroid uterus. 4.  Duplicated right renal collecting system is noted.   Original Report Authenticated By: Bernadene Bell.  D'ALESSIO, M.D.       MDM  Iv ns. Dilaudid 1 mg iv. zofran iv. Labs. protonnix iv.   Recheck persistent mid to upper abdominal tenderness and nausea. No lower abd/pelvic tenderness.  Ct results noted.  Recheck pt, persistent mid/upper abd pain and tenderness. Persistent nausea.No peritoneal signs. Iv ns bolus. Dilaudid 1 mg iv. zofran iv.   Given enteritis on ct, ?etiology, ?viral syndrome, w persistent pain and persistent n/v, will admit for iv, pain and nausea control.   [facility] called. Dr Isidoro Donning admit, med surg, [facility] Hardin Negus, MD 06/14/12 0001  Suzi Roots, MD 06/14/12 [phone]

## 2012-06-13 NOTE — ED Notes (Signed)
Per EMS pt in from urgent care c/o generalized abd pain since 0900 today. Was given 4mg  zofran by dr at urgent care with no relief.

## 2012-06-13 NOTE — ED Notes (Signed)
Verbal report given to Abbie RN 

## 2012-06-14 ENCOUNTER — Inpatient Hospital Stay (HOSPITAL_COMMUNITY): Payer: Self-pay

## 2012-06-14 ENCOUNTER — Encounter (HOSPITAL_COMMUNITY): Payer: Self-pay | Admitting: *Deleted

## 2012-06-14 DIAGNOSIS — R112 Nausea with vomiting, unspecified: Secondary | ICD-10-CM

## 2012-06-14 DIAGNOSIS — K5289 Other specified noninfective gastroenteritis and colitis: Secondary | ICD-10-CM

## 2012-06-14 DIAGNOSIS — R109 Unspecified abdominal pain: Secondary | ICD-10-CM | POA: Diagnosis present

## 2012-06-14 DIAGNOSIS — K5900 Constipation, unspecified: Secondary | ICD-10-CM

## 2012-06-14 DIAGNOSIS — E860 Dehydration: Secondary | ICD-10-CM | POA: Diagnosis present

## 2012-06-14 DIAGNOSIS — K529 Noninfective gastroenteritis and colitis, unspecified: Secondary | ICD-10-CM | POA: Diagnosis present

## 2012-06-14 DIAGNOSIS — I10 Essential (primary) hypertension: Secondary | ICD-10-CM

## 2012-06-14 DIAGNOSIS — R52 Pain, unspecified: Secondary | ICD-10-CM

## 2012-06-14 LAB — BASIC METABOLIC PANEL
BUN: 10 mg/dL (ref 6–23)
CO2: 26 mEq/L (ref 19–32)
Calcium: 8.7 mg/dL (ref 8.4–10.5)
Creatinine, Ser: 0.95 mg/dL (ref 0.50–1.10)
GFR calc non Af Amer: 73 mL/min — ABNORMAL LOW (ref >90)
Glucose, Bld: 140 mg/dL — ABNORMAL HIGH (ref 70–99)

## 2012-06-14 LAB — CBC
MCH: 26.8 pg (ref 26.0–34.0)
MCHC: 33.2 g/dL (ref 30.0–36.0)
MCV: 80.7 fL (ref 78.0–100.0)
Platelets: 376 10*3/uL (ref 150–400)
RDW: 14.4 % (ref 11.5–15.5)

## 2012-06-14 MED ORDER — ACETAMINOPHEN 650 MG RE SUPP: 650.0000 mg | Freq: Four times a day (QID) | RECTAL | Status: DC | PRN

## 2012-06-14 MED ORDER — ONDANSETRON HCL 4 MG/2ML IJ SOLN: 4.0000 mg | Freq: Three times a day (TID) | INTRAMUSCULAR | Status: DC | PRN

## 2012-06-14 MED ORDER — HYDROMORPHONE HCL PF 1 MG/ML IJ SOLN
1.0000 mg | INTRAMUSCULAR | Status: DC | PRN
  Administered 2012-06-14 – 2012-06-16 (×6): 1 mg via INTRAVENOUS
  Filled 2012-06-14 (×5): qty 1

## 2012-06-14 MED ORDER — HYDROMORPHONE HCL PF 1 MG/ML IJ SOLN
1.0000 mg | INTRAMUSCULAR | Status: DC | PRN
  Administered 2012-06-14: 2 mg via INTRAVENOUS
  Administered 2012-06-14: 1 mg via INTRAVENOUS
  Filled 2012-06-14: qty 2
  Filled 2012-06-14: qty 1

## 2012-06-14 MED ORDER — PROMETHAZINE HCL 25 MG/ML IJ SOLN
12.5000 mg | Freq: Four times a day (QID) | INTRAMUSCULAR | Status: DC | PRN
  Administered 2012-06-14: 12.5 mg via INTRAVENOUS
  Administered 2012-06-14: 22:00:00 via INTRAVENOUS
  Administered 2012-06-16: 12.5 mg via INTRAVENOUS
  Filled 2012-06-14 (×3): qty 1

## 2012-06-14 MED ORDER — ACETAMINOPHEN 325 MG PO TABS: 650.0000 mg | ORAL_TABLET | Freq: Four times a day (QID) | ORAL | Status: DC | PRN

## 2012-06-14 MED ORDER — METRONIDAZOLE IN NACL 5-0.79 MG/ML-% IV SOLN
500.0000 mg | Freq: Three times a day (TID) | INTRAVENOUS | Status: DC
  Administered 2012-06-14 – 2012-06-17 (×10): 500 mg via INTRAVENOUS
  Filled 2012-06-14 (×12): qty 100

## 2012-06-14 MED ORDER — PANTOPRAZOLE SODIUM 40 MG IV SOLR
40.0000 mg | Freq: Two times a day (BID) | INTRAVENOUS | Status: DC
  Administered 2012-06-14 – 2012-06-17 (×8): 40 mg via INTRAVENOUS
  Filled 2012-06-14 (×9): qty 40

## 2012-06-14 MED ORDER — BISACODYL 10 MG RE SUPP
10.0000 mg | Freq: Every day | RECTAL | Status: DC | PRN
  Administered 2012-06-14: 10 mg via RECTAL
  Filled 2012-06-14: qty 1

## 2012-06-14 MED ORDER — CIPROFLOXACIN IN D5W 400 MG/200ML IV SOLN
400.0000 mg | Freq: Two times a day (BID) | INTRAVENOUS | Status: DC
  Administered 2012-06-14 – 2012-06-17 (×6): 400 mg via INTRAVENOUS
  Filled 2012-06-14 (×8): qty 200

## 2012-06-14 MED ORDER — SODIUM CHLORIDE 0.9 % IV SOLN
INTRAVENOUS | Status: DC
  Administered 2012-06-15 – 2012-06-16 (×2): via INTRAVENOUS

## 2012-06-14 MED ORDER — ALUM & MAG HYDROXIDE-SIMETH 200-200-20 MG/5ML PO SUSP: 30.0000 mL | Freq: Four times a day (QID) | ORAL | Status: DC | PRN

## 2012-06-14 MED ORDER — HYDROMORPHONE HCL PF 1 MG/ML IJ SOLN: 1.0000 mg | INTRAMUSCULAR | Status: DC | PRN

## 2012-06-14 MED ORDER — ONDANSETRON HCL 4 MG PO TABS: 4.0000 mg | ORAL_TABLET | Freq: Four times a day (QID) | ORAL | Status: DC | PRN

## 2012-06-14 MED ORDER — ONDANSETRON HCL 4 MG/2ML IJ SOLN
4.0000 mg | Freq: Four times a day (QID) | INTRAMUSCULAR | Status: DC | PRN
  Administered 2012-06-14 – 2012-06-16 (×5): 4 mg via INTRAVENOUS
  Filled 2012-06-14 (×4): qty 2

## 2012-06-14 MED ORDER — SODIUM CHLORIDE 0.9 % IV SOLN: INTRAVENOUS | Status: DC

## 2012-06-14 MED ORDER — HYDROCODONE-ACETAMINOPHEN 5-325 MG PO TABS: 1.0000 | ORAL_TABLET | ORAL | Status: DC | PRN

## 2012-06-14 NOTE — Progress Notes (Signed)
[facility] PROGRESS NOTE  Becky Vazquez ZOX:[id number] DOB: Oct 03, 1970 DOA: 06/13/2012 PCP: Burtis Junes, MD  Assessment/Plan: 1. N/V--persists, secondary to presumed enteritis. Antiemetics, supportive care. Check AXR. Exam benign--no radiographic, lab or clinical evidence to suggest obstruction, gallbladder involvement, appendix. 2. Abdominal pain--generalized, especially epigastric, RUQ, RLQ. Supportive care. 3. Acute enteritis--no diarrhea. For now continue empiric Cipro, Flagyl. Terminal ileum appeared normal on CT. Doubt inflammatory etiology. No history of GI disease. 4. HTN--stable. 5. Equivocal urinalysis--Culture pending. Already on Cipro. 6. Constipation--suppository, if no response, try enema.  Does not want NGT now, but if vomiting continues may need it.   Code Status: Full code Family Communication: discussed with family at bedside Disposition Plan: home when improved  Brendia Sacks, MD  [facility] Team 6 Pager [phone]. If 8PM-8AM, please contact night-coverage at [email or web address], password [facility] 06/14/2012, 12:56 PM  LOS: 1 day   Brief narrative: 41 year old female with history of hypertension presented to ED with acute abdominal pain since today morning. Patient reported that she was in her usual state of health this morning, ate breakfast and then started having intractable nausea and vomiting with abdominal pain. She describes abdominal pain as periumbilical and upper abdominal/RUQ crampy, intermittent 10/10 in intensity. She had intractable vomiting with multiple episodes, denies any hematemesis. She denies any similar symptoms in the family, any new dish or restaurant. She denies any fever, chills, back pain or flank pain.  Consultants:  None  Procedures:  None  Antibiotics:  Cipro 9/18>>  Flagyl 9/18>>  HPI/Subjective: Afebrile, vitals stable. Nausea and vomiting continues. Constipated.  Objective: Filed Vitals:   06/13/12 1854  06/14/12 0216 06/14/12 0251 06/14/12 0552  BP: 119/88 108/63 133/88 137/88  Pulse: 110 78 92 85  Temp: 98.3 F (36.8 C) 98 F (36.7 C) 98.1 F (36.7 C) 98.3 F (36.8 C)  TempSrc: Oral Oral Oral Oral  Resp: 22 18 18 18   SpO2: 98% 96% 98% 100%    Intake/Output Summary (Last 24 hours) at 06/14/12 1256 Last data filed at 06/14/12 0300  Gross per 24 hour  Intake    200 ml  Output      0 ml  Net    200 ml   There were no vitals filed for this visit.  Exam:  General:  Appears calm and comfortable  Eyes: PERRL, normal lids, irises  ENT: grossly normal hearing, lips & tongue  Cardiovascular: RRR, no m/r/g.   Respiratory: CTA bilaterally, no w/r/r. Normal respiratory effort.  Abdomen: soft, nd, positive bowel sounds (normal), tender epigastrum, RUQ, RLQ, low abdomen  Psychiatric: grossly normal mood and affect, speech fluent and appropriate  Data Reviewed: Basic Metabolic Panel:  Lab 06/14/12 8295 06/13/12 1850  NA 137 133*  K 3.9 3.5  CL 100 94*  CO2 26 26  GLUCOSE 140* 161*  BUN 10 11  CREATININE 0.95 0.95  CALCIUM 8.7 10.0  MG -- --  PHOS -- --   Liver Function Tests:  Lab 06/13/12 1850  AST 11  ALT 6  ALKPHOS 52  BILITOT 0.3  PROT 8.4*  ALBUMIN 3.5    Lab 06/13/12 1850  LIPASE 15  AMYLASE --   CBC:  Lab 06/14/12 0450 06/13/12 1850  WBC 14.1* 14.2*  NEUTROABS -- 12.2*  HGB 12.1 13.7  HCT 36.4 39.5  MCV 80.7 79.2  PLT 376 445*   Studies: Ct Abdomen Pelvis W Contrast  06/13/2012  *RADIOLOGY REPORT*  Clinical Data: Abdominal pain.  CT ABDOMEN AND PELVIS WITH CONTRAST  Technique:  Multidetector CT imaging of the abdomen and pelvis was performed following the standard protocol during bolus administration of intravenous contrast.  Contrast: OMNIPAQUE IOHEXOL 300 MG/ML  SOLN  Comparison: None.  Findings: The lung bases are clear. There is cardiomegaly.  No pleural or pericardial effusion.  There are multiple loops of abnormal small bowel with  wall thickening and hyperenhancement identified.  There is some perihepatic, mesenteric and free pelvic fluid.  No focal fluid collection is identified.  No pneumatosis, portal venous gas or free intraperitoneal air is seen.  The celiac axis and superior and inferior mesenteric arteries all opacified normally.  No notable vascular disease is identified.  The colon is largely decompressed but otherwise unremarkable.  The appendix appears normal.  The stomach is nearly completely decompressed but otherwise unremarkable.  The gallbladder, liver, spleen, adrenal glands, pancreas and left kidney appear normal.  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 No lymphadenopathy.  Fibroid uterus is noted.  Urinary bladder is unremarkable.  No focal bony abnormality.  IMPRESSION:  1.  Multiple abnormal loops of small bowel with scattered free abdominal fluid most consistent with infectious or inflammatory enteritis.  No finding to suggest ischemia is identified.  Note is made that the terminal ileum appears normal. 2.  Cardiomegaly. 3.  Fibroid uterus. 4.  Duplicated right renal collecting system is noted.   Original Report Authenticated By: Bernadene Bell. D'ALESSIO, M.D.     Scheduled Meds:   . ciprofloxacin  400 mg Intravenous BID  . fentaNYL  50 mcg Intravenous Once  . HYDROmorphone  1 mg Intravenous Once  . HYDROmorphone  1 mg Intravenous Once  . metronidazole  500 mg Intravenous Q8H  . ondansetron (ZOFRAN) IV  4 mg Intravenous Once  . ondansetron (ZOFRAN) IV  4 mg Intravenous Once  . ondansetron (ZOFRAN) IV  4 mg Intravenous Once  . pantoprazole (PROTONIX) IV  40 mg Intravenous Once  . pantoprazole (PROTONIX) IV  40 mg Intravenous Q12H  . sodium chloride  1,000 mL Intravenous Once  . DISCONTD: sodium chloride   Intravenous STAT   Continuous Infusions:   . sodium chloride 700 mL (06/14/12 0252)    Principal Problem:  *Enteritis Active  Problems:  HYPERTENSION, BENIGN  Abdominal pain, acute  Dehydration     Brendia Sacks, MD  [facility] Team 6 Pager [phone]. If 8PM-8AM, please contact night-coverage at [email or web address], password [facility] 06/14/2012, 12:56 PM  LOS: 1 day   Time spent: 25 minutes

## 2012-06-14 NOTE — H&P (Signed)
History and Physical       Hospital Admission Note Date: 06/14/2012  Patient name: Becky Vazquez Medical record number: [id number] Date of birth: 07-11-1971 Age: 41 y.o. Gender: female PCP: Burtis Junes, MD    Chief Complaint:  Abdominal pain with intractable nausea and vomiting since morning  HPI: Patient is a 41 year old female with history of hypertension presented to ED with acute abdominal pain since today morning. Patient reported that she was in her usual state of health this morning, ate breakfast and then started having intractable nausea and vomiting with abdominal pain. She describes abdominal pain as periumbilical and upper abdominal/RUQ crampy, intermittent 10/10 in intensity. She had intractable vomiting with multiple episodes, denies any hematemesis. She denies any similar symptoms in the family, any new dish or restaurant. She denies any fever, chills, back pain or flank pain.   Review of Systems:  Constitutional: Denies fever, chills, diaphoresis. Endorses loss of appetite and unable to hold anything with intractable nausea and vomiting.  HEENT: Denies photophobia, eye pain, redness, hearing loss, ear pain, congestion, sore throat, rhinorrhea, sneezing, mouth sores, trouble swallowing, neck pain, neck stiffness and tinnitus.   Respiratory: Denies SOB, DOE, cough, chest tightness,  and wheezing.   Cardiovascular: Denies chest pain, palpitations and leg swelling.  Gastrointestinal: Please see history of present illness patient denied any diarrhea. Genitourinary: Denies dysuria, urgency, frequency, hematuria, flank pain and difficulty urinating.  Musculoskeletal: Denies myalgias, back pain, joint swelling, arthralgias and gait problem.  Skin: Denies pallor, rash and wound.  Neurological: Denies dizziness, seizures, syncope, weakness, light-headedness, numbness and headaches.  Hematological: Denies adenopathy. Easy  bruising, personal or family bleeding history  Psychiatric/Behavioral: Denies suicidal ideation, mood changes, confusion, nervousness, sleep disturbance and agitation  Past Medical History: Past Medical History  Diagnosis Date  . Hypertension    Past Surgical History  Procedure Date  . Tubal ligation     Medications: Prior to Admission medications   Medication Sig Start Date End Date Taking? Authorizing Provider  ferrous sulfate 325 (65 FE) MG tablet Take 325 mg by mouth daily with breakfast.    Yes Historical Provider, MD  ibuprofen (ADVIL,MOTRIN) 200 MG tablet Take 400 mg by mouth every 6 (six) hours as needed.   Yes Historical Provider, MD  lisinopril-hydrochlorothiazide (PRINZIDE,ZESTORETIC) 20-12.5 MG per tablet take 2 tablets by mouth once daily 10/21/11  Yes Rodolph Bong, MD  omeprazole (PRILOSEC) 20 MG capsule Take 20 mg by mouth daily.   Yes Historical Provider, MD    Allergies:  No Known Allergies  Social History:  does not have a smoking history on file. She does not have any smokeless tobacco history on file. She reports that she does not drink alcohol or use illicit drugs.  Family History: History reviewed. No pertinent family history.  Physical Exam: Blood pressure 119/88, pulse 110, temperature 98.3 F (36.8 C), temperature source Oral, resp. rate 22, last menstrual period 05/30/2012, SpO2 98.00%. General: Alert, awake, oriented x3, in mild to moderate distress secondary to abdominal pain. HEENT: normocephalic, atraumatic, anicteric sclera, pink conjunctiva, pupils equal and reactive to light and accomodation, oropharynx clear Neck: supple, no masses or lymphadenopathy, no goiter, no bruits  Heart: Regular rate and rhythm, without murmurs, rubs or gallops. Lungs: Clear to auscultation bilaterally, no wheezing, rales or rhonchi. Abdomen: Soft, diffuse tenderness, nondistended, positive bowel sounds, no masses, no guarding. Extremities: No clubbing, cyanosis or  edema with positive pedal pulses. Neuro: Grossly intact, no focal neurological deficits, strength 5/5 upper and lower  extremities bilaterally Psych: alert and oriented x 3, normal mood and affect Skin: no rashes or lesions, warm and dry   LABS on Admission:  Basic Metabolic Panel:  Lab 06/13/12 1610  NA 133*  K 3.5  CL 94*  CO2 26  GLUCOSE 161*  BUN 11  CREATININE 0.95  CALCIUM 10.0  MG --  PHOS --   Liver Function Tests:  Lab 06/13/12 1850  AST 11  ALT 6  ALKPHOS 52  BILITOT 0.3  PROT 8.4*  ALBUMIN 3.5    Lab 06/13/12 1850  LIPASE 15  AMYLASE --   CBC:  Lab 06/13/12 1850  WBC 14.2*  NEUTROABS 12.2*  HGB 13.7  HCT 39.5  MCV 79.2  PLT 445*    Radiological Exams on Admission: Ct Abdomen Pelvis W Contrast  06/13/2012  IMPRESSION:  1.  Multiple abnormal loops of small bowel with scattered free abdominal fluid most consistent with infectious or inflammatory enteritis.  No finding to suggest ischemia is identified.  Note is made that the terminal ileum appears normal. 2.  Cardiomegaly. 3.  Fibroid uterus. 4.  Duplicated right renal collecting system is noted.   Original Report Authenticated By: Bernadene Bell. Maricela Curet, M.D.     Assessment/Plan Present on Admission:  .Abdominal pain, acute secondary to Enteritis, leukocytosis - Will admit to MedSurg, place on IV fluid hydration, pain control, antiemetics, clear liquid diet  - I will start the patient on IV ciprofloxacin and Flagyl   .Dehydration due to intractable nausea and vomiting  - Continue IV fluids, clears   .HYPERTENSION, BENIGN: Currently borderline hypotensive - Hold lisinopril/HCTZ, continue IV fluid hydration  DVT prophylaxis: SCDs  CODE STATUS: Full CODE STATUS  Further plan will depend as patient's clinical course evolves and further radiologic and laboratory data become available.   Time Spent on Admission: 1 hour  Oceanna Arruda M.D. [facility] 06/14/2012, 12:31 AM Pager:  [phone]  If 7PM-7AM, please contact night-coverage [email or web address] Password [email or web address]

## 2012-06-15 LAB — COMPREHENSIVE METABOLIC PANEL
AST: 7 U/L (ref 0–37)
Albumin: 2.5 g/dL — ABNORMAL LOW (ref 3.5–5.2)
Alkaline Phosphatase: 40 U/L (ref 39–117)
BUN: 8 mg/dL (ref 6–23)
Potassium: 3.5 mEq/L (ref 3.5–5.1)
Sodium: 137 mEq/L (ref 135–145)
Total Protein: 6.1 g/dL (ref 6.0–8.3)

## 2012-06-15 LAB — CBC
HCT: 34.1 % — ABNORMAL LOW (ref 36.0–46.0)
MCHC: 33.4 g/dL (ref 30.0–36.0)
Platelets: 353 10*3/uL (ref 150–400)
RDW: 14.6 % (ref 11.5–15.5)
WBC: 11.5 10*3/uL — ABNORMAL HIGH (ref 4.0–10.5)

## 2012-06-15 MED ORDER — INFLUENZA VIRUS VACC SPLIT PF IM SUSP
0.5000 mL | INTRAMUSCULAR | Status: AC
  Administered 2012-06-16: 0.5 mL via INTRAMUSCULAR
  Filled 2012-06-15: qty 0.5

## 2012-06-15 MED ORDER — HYDROMORPHONE HCL PF 1 MG/ML IJ SOLN
INTRAMUSCULAR | Status: AC
  Filled 2012-06-15: qty 1

## 2012-06-15 MED ORDER — ONDANSETRON HCL 4 MG/2ML IJ SOLN
INTRAMUSCULAR | Status: AC
  Filled 2012-06-15: qty 2

## 2012-06-15 NOTE — Progress Notes (Signed)
[facility] PROGRESS NOTE  Becky Vazquez OZH:[id number] DOB: 1971/06/08 DOA: 06/13/2012 PCP: Burtis Junes, MD  Assessment/Plan: 1. N/V--better, no n/v since last night. Presumed enteritis. Continue antiemetics, supportive care. AXR yesterday was reassuring, no evidence of ileus or obstruction. Exam remains benign--no radiographic, lab or clinical evidence to suggest obstruction, gallbladder involvement, appendix. 2. Abdominal pain--improved, generalized. Supportive care. 3. Acute enteritis--no diarrhea. For now continue empiric Cipro, Flagyl. Terminal ileum appeared normal on CT. Doubt inflammatory etiology. No history of GI disease. 4. HTN--stable. 5. Equivocal urinalysis--Culture pending. Already on Cipro. 6. Constipation--BM 9/18  Improving, continue current care  Code Status: Full code Family Communication: discussed with family at bedside Disposition Plan: home, anticipate 72 hours  Brendia Sacks, MD  [facility] Team 6 Pager [phone]. If 8PM-8AM, please contact night-coverage at [email or web address], password [facility] 06/15/2012, 10:56 AM  LOS: 2 days   Brief narrative: 41 year old female with history of hypertension presented to ED with acute abdominal pain since today morning. Patient reported that she was in her usual state of health this morning, ate breakfast and then started having intractable nausea and vomiting with abdominal pain. She describes abdominal pain as periumbilical and upper abdominal/RUQ crampy, intermittent 10/10 in intensity. She had intractable vomiting with multiple episodes, denies any hematemesis. She denies any similar symptoms in the family, any new dish or restaurant. She denies any fever, chills, back pain or flank pain.  Consultants:  None  Procedures:  None  Antibiotics:  Cipro 9/18>>  Flagyl 9/18>>  HPI/Subjective: Afebrile, vitals stable. Feels better, no vomiting since last night. No appetite. No  pain.  Objective: Filed Vitals:   06/14/12 0552 06/14/12 1425 06/14/12 2130 06/15/12 0542  BP: 137/88 112/74 136/88 146/97  Pulse: 85 64 84 83  Temp: 98.3 F (36.8 C) 98.1 F (36.7 C) 98.7 F (37.1 C) 98.3 F (36.8 C)  TempSrc: Oral Oral Oral Oral  Resp: 18 18 18 18   SpO2: 100% 99% 100% 100%    Intake/Output Summary (Last 24 hours) at 06/15/12 1056 Last data filed at 06/15/12 0400  Gross per 24 hour  Intake 3013.33 ml  Output      0 ml  Net 3013.33 ml   There were no vitals filed for this visit.  Exam:  General:  Appears calm and comfortable today lying on left side resting in bed  Cardiovascular: RRR, no m/r/g.   Respiratory: CTA bilaterally, no w/r/r. Normal respiratory effort.  Abdomen: soft, nd, positive bowel sounds (normal),non-tender  Psychiatric: grossly normal mood and affect, speech fluent and appropriate  Data Reviewed: Basic Metabolic Panel:  Lab 06/15/12 1324 06/14/12 0450 06/13/12 1850  NA 137 137 133*  K 3.5 3.9 3.5  CL 102 100 94*  CO2 25 26 26   GLUCOSE 114* 140* 161*  BUN 8 10 11   CREATININE 0.97 0.95 0.95  CALCIUM 7.9* 8.7 10.0  MG -- -- --  PHOS -- -- --   Liver Function Tests:  Lab 06/15/12 0437 06/13/12 1850  AST 7 11  ALT 5 6  ALKPHOS 40 52  BILITOT 0.3 0.3  PROT 6.1 8.4*  ALBUMIN 2.5* 3.5    Lab 06/13/12 1850  LIPASE 15  AMYLASE --   CBC:  Lab 06/15/12 0437 06/14/12 0450 06/13/12 1850  WBC 11.5* 14.1* 14.2*  NEUTROABS -- -- 12.2*  HGB 11.4* 12.1 13.7  HCT 34.1* 36.4 39.5  MCV 81.2 80.7 79.2  PLT 353 376 445*   Studies: Ct Abdomen Pelvis W Contrast  06/13/2012  *RADIOLOGY REPORT*  Clinical Data: Abdominal pain.  CT ABDOMEN AND PELVIS WITH CONTRAST  Technique:  Multidetector CT imaging of the abdomen and pelvis was performed following the standard protocol during bolus administration of intravenous contrast.  Contrast: OMNIPAQUE IOHEXOL 300 MG/ML  SOLN  Comparison: None.  Findings: The lung bases are clear.  There is cardiomegaly.  No pleural or pericardial effusion.  There are multiple loops of abnormal small bowel with wall thickening and hyperenhancement identified.  There is some perihepatic, mesenteric and free pelvic fluid.  No focal fluid collection is identified.  No pneumatosis, portal venous gas or free intraperitoneal air is seen.  The celiac axis and superior and inferior mesenteric arteries all opacified normally.  No notable vascular disease is identified.  The colon is largely decompressed but otherwise unremarkable.  The appendix appears normal.  The stomach is nearly completely decompressed but otherwise unremarkable.  The gallbladder, liver, spleen, adrenal glands, pancreas and left kidney appear normal.  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 No lymphadenopathy.  Fibroid uterus is noted.  Urinary bladder is unremarkable.  No focal bony abnormality.  IMPRESSION:  1.  Multiple abnormal loops of small bowel with scattered free abdominal fluid most consistent with infectious or inflammatory enteritis.  No finding to suggest ischemia is identified.  Note is made that the terminal ileum appears normal. 2.  Cardiomegaly. 3.  Fibroid uterus. 4.  Duplicated right renal collecting system is noted.   Original Report Authenticated By: Bernadene Bell. D'ALESSIO, M.D.    Dg Abd 2 Views  06/14/2012  *RADIOLOGY REPORT*  Clinical Data: Nausea, vomiting, abdominal pain, follow-up enteritis  ABDOMEN - 2 VIEW  Comparison: None Correlation:  CT abdomen and pelvis 06/13/2012  Findings: Biconvex thoracolumbar scoliosis. Scattered pelvic phleboliths. Paucity of bowel gas. No definite bowel wall thickening, bowel dilatation, or free intraperitoneal air. Lung bases clear.  IMPRESSION: No bowel dilatation or evidence of obstruction identified. Bowel wall thickening identified on the CT of 06/13/2012 cannot be assessed on current exam due to the lack of bowel  gas.   Original Report Authenticated By: Lollie Marrow, M.D.     Scheduled Meds:    . ciprofloxacin  400 mg Intravenous BID  . HYDROmorphone      . metronidazole  500 mg Intravenous Q8H  . ondansetron      . pantoprazole (PROTONIX) IV  40 mg Intravenous Q12H   Continuous Infusions:    . sodium chloride 700 mL (06/14/12 0252)    Principal Problem:  *Enteritis Active Problems:  HYPERTENSION, BENIGN  Abdominal pain, acute  Dehydration  Nausea & vomiting  Constipation     Brendia Sacks, MD  [facility] Team 6 Pager [phone]. If 8PM-8AM, please contact night-coverage at [email or web address], password [facility] 06/15/2012, 10:56 AM  LOS: 2 days   Time spent: 20 minutes

## 2012-06-16 LAB — BASIC METABOLIC PANEL
CO2: 21 mEq/L (ref 19–32)
Calcium: 7.8 mg/dL — ABNORMAL LOW (ref 8.4–10.5)
GFR calc non Af Amer: 77 mL/min — ABNORMAL LOW (ref >90)
Potassium: 3.3 mEq/L — ABNORMAL LOW (ref 3.5–5.1)
Sodium: 136 mEq/L (ref 135–145)

## 2012-06-16 LAB — URINE CULTURE
Colony Count: NO GROWTH
Culture: NO GROWTH

## 2012-06-16 LAB — CBC
MCH: 27.5 pg (ref 26.0–34.0)
MCHC: 34.4 g/dL (ref 30.0–36.0)
Platelets: 307 10*3/uL (ref 150–400)
RBC: 4.18 MIL/uL (ref 3.87–5.11)

## 2012-06-16 NOTE — Progress Notes (Signed)
[facility] PROGRESS NOTE  Becky Vazquez WUJ:[id number] DOB: 09-20-1971 DOA: 06/13/2012 PCP: Burtis Junes, MD  Assessment/Plan: 1. N/V--somewhat improved, with intermittent n/v. Presumed enteritis. Continue antiemetics, supportive care. Exam remains benign--no radiographic, lab or clinical evidence to suggest obstruction, gallbladder involvement, appendix. Advance diet. 2. Abdominal pain--appears resolved. Supportive care. 3. Acute enteritis--no diarrhea. For now continue empiric Cipro, Flagyl. Terminal ileum appeared normal on CT. Doubt inflammatory etiology. No history of GI disease. 4. HTN--stable.  Although some vomiting last night, appears better each day--ate some cheesecake her daughter brought. Advance diet, possible discharge 9/21,  Code Status: Full code Family Communication: discussed with family at bedside Disposition Plan: home, anticipate 18 hours  Brendia Sacks, MD  [facility] Team 6 Pager [phone]. If 8PM-8AM, please contact night-coverage at [email or web address], password [facility] 06/16/2012, 4:25 PM  LOS: 3 days   Brief narrative: 41 year old female with history of hypertension presented to ED with acute abdominal pain since today morning. Patient reported that she was in her usual state of health this morning, ate breakfast and then started having intractable nausea and vomiting with abdominal pain. She describes abdominal pain as periumbilical and upper abdominal/RUQ crampy, intermittent 10/10 in intensity. She had intractable vomiting with multiple episodes, denies any hematemesis. She denies any similar symptoms in the family, any new dish or restaurant. She denies any fever, chills, back pain or flank pain.  Consultants:  None  Procedures:  None  Antibiotics:  Cipro 9/18>>  Flagyl 9/18>>  HPI/Subjective: Afebrile, vitals stable. Some vomiting last night, but tolerating diet ok. Ate a little cheesecake daughter brought. No  pain.  Objective: Filed Vitals:   06/15/12 1100 06/15/12 1323 06/15/12 2221 06/16/12 0630  BP:  139/84 133/76 128/80  Pulse:  81 86 80  Temp:  98.6 F (37 C) 98.7 F (37.1 C) 98.6 F (37 C)  TempSrc:  Oral Oral Oral  Resp:  20 18 20   Height: 5\' 5"  (1.651 m)     Weight: 104.327 kg (230 lb)     SpO2:  100% 98% 98%    Intake/Output Summary (Last 24 hours) at 06/16/12 1625 Last data filed at 06/16/12 0500  Gross per 24 hour  Intake   2890 ml  Output      0 ml  Net   2890 ml   Filed Weights   06/15/12 1100  Weight: 104.327 kg (230 lb)    Exam:  General:  Appears calm and comfortable today   Cardiovascular: RRR, no m/r/g.   Respiratory: CTA bilaterally, no w/r/r. Normal respiratory effort.  Abdomen: soft, nd, positive bowel sounds (normal), non-tender  Psychiatric: grossly normal mood and affect, speech fluent and appropriate  Data Reviewed: Basic Metabolic Panel:  Lab 06/16/12 8657 06/15/12 0437 06/14/12 0450 06/13/12 1850  NA 136 137 137 133*  K 3.3* 3.5 3.9 3.5  CL 102 102 100 94*  CO2 21 25 26 26   GLUCOSE 131* 114* 140* 161*  BUN 6 8 10 11   CREATININE 0.91 0.97 0.95 0.95  CALCIUM 7.8* 7.9* 8.7 10.0  MG -- -- -- --  PHOS -- -- -- --   Liver Function Tests:  Lab 06/15/12 0437 06/13/12 1850  AST 7 11  ALT 5 6  ALKPHOS 40 52  BILITOT 0.3 0.3  PROT 6.1 8.4*  ALBUMIN 2.5* 3.5    Lab 06/13/12 1850  LIPASE 15  AMYLASE --   CBC:  Lab 06/16/12 0437 06/15/12 0437 06/14/12 0450 06/13/12 1850  WBC 10.7* 11.5* 14.1* 14.2*  NEUTROABS -- -- --  12.2*  HGB 11.5* 11.4* 12.1 13.7  HCT 33.4* 34.1* 36.4 39.5  MCV 79.9 81.2 80.7 79.2  PLT 307 353 376 445*   Studies: No results found.  Scheduled Meds:    . ciprofloxacin  400 mg Intravenous BID  . influenza  inactive virus vaccine  0.5 mL Intramuscular Tomorrow-1000  . metronidazole  500 mg Intravenous Q8H  . pantoprazole (PROTONIX) IV  40 mg Intravenous Q12H   Continuous Infusions:    . DISCONTD:  sodium chloride 50 mL/hr at 06/16/12 1232    Principal Problem:  *Enteritis Active Problems:  HYPERTENSION, BENIGN  Abdominal pain, acute  Dehydration  Nausea & vomiting  Constipation     Brendia Sacks, MD  [facility] Team 6 Pager [phone]. If 8PM-8AM, please contact night-coverage at [email or web address], password [facility] 06/16/2012, 4:25 PM  LOS: 3 days   Time spent: 15 minutes

## 2012-06-17 LAB — CBC
Platelets: 265 10*3/uL (ref 150–400)
RBC: 3.65 MIL/uL — ABNORMAL LOW (ref 3.87–5.11)
WBC: 7.7 10*3/uL (ref 4.0–10.5)

## 2012-06-17 LAB — BASIC METABOLIC PANEL
CO2: 25 mEq/L (ref 19–32)
Chloride: 102 mEq/L (ref 96–112)
Sodium: 136 mEq/L (ref 135–145)

## 2012-06-17 MED ORDER — METRONIDAZOLE 500 MG PO TABS: 500.0000 mg | ORAL_TABLET | Freq: Three times a day (TID) | ORAL | Status: DC

## 2012-06-17 MED ORDER — POTASSIUM CHLORIDE CRYS ER 20 MEQ PO TBCR
40.0000 meq | EXTENDED_RELEASE_TABLET | Freq: Once | ORAL | Status: AC
  Administered 2012-06-17: 40 meq via ORAL
  Filled 2012-06-17: qty 2

## 2012-06-17 MED ORDER — CIPROFLOXACIN HCL 500 MG PO TABS: 500.0000 mg | ORAL_TABLET | Freq: Two times a day (BID) | ORAL | Status: DC

## 2012-06-17 MED ORDER — POTASSIUM CHLORIDE CRYS ER 20 MEQ PO TBCR
40.0000 meq | EXTENDED_RELEASE_TABLET | Freq: Two times a day (BID) | ORAL | Status: DC
  Administered 2012-06-17: 40 meq via ORAL
  Filled 2012-06-17 (×2): qty 2

## 2012-06-17 NOTE — Discharge Summary (Signed)
Physician Discharge Summary  Becky Vazquez ZOX:[id number] DOB: 12/31/1970 DOA: 06/13/2012  PCP: Burtis Junes, MD  Admit date: 06/13/2012 Discharge date: 06/17/2012  Recommendations for Outpatient Follow-up:  1. Follow-up resolution of enteritis 2. Follow-up anemia as clinically indicated   Follow-up Information    Follow up with Burtis Junes, MD. In 2 weeks.   Contact information:   [address] [facility] [id number] [phone]         Discharge Diagnoses:  1. N/V    2. Acute enteritis 3. Normocytic anemia 4. HTN  Discharge Condition: improved Disposition: home  Diet recommendation: regular  Filed Weights   06/15/12 1100  Weight: 104.327 kg (230 lb)    History of present illness:  41 year old female with history of hypertension presented to ED with acute abdominal pain since today morning. Patient reported that she was in her usual state of health this morning, ate breakfast and then started having intractable nausea and vomiting with abdominal pain. She describes abdominal pain as periumbilical and upper abdominal/RUQ crampy, intermittent 10/10 in intensity. She had intractable vomiting with multiple episodes, denies any hematemesis. She denies any similar symptoms in the family, any new dish or restaurant. She denies any fever, chills, back pain or flank pain.  Hospital Course:  Becky Vazquez was admitted for intractable nausea, vomiting and abdominal pain, presumed infectious enteritis. CT imaging as below, and follow-up x-rays were unremarkable. She gradually improved with antiemetics and empiric Cipro and Flagyl. Nausea, vomiting, abdominal pain now resolved and tolerating solid diet. Never had diarrhea.  1. N/V--resolved. Presumed enteritis. Tolerating diet. Exam remains benign--no radiographic, lab or clinical evidence to suggest obstruction, gallbladder involvement, appendix.    2. Abdominal pain--resolved.   3. Acute enteritis--no  diarrhea. Finish empiric Cipro, Flagyl. Terminal ileum appeared normal on CT. Doubt inflammatory etiology. No history of GI disease.  4. Hypokalemia--repleted.  5. Normocytic anemia--follow-up as outpatient, asymptomatic, no bleeding. Suspect dilutional component. Resume outpatient iron on discharge.  6. HTN--stable.  Consultants:  None  Procedures:  None  Antibiotics:  Cipro 9/18>>9/24   Flagyl 9/18>>9/24  Discharge Instructions  Discharge Orders    Future Orders Please Complete By Expires   Diet - low sodium heart healthy      Discharge instructions      Comments:   Be sure to finish antibiotics. Call physician or seek immediate medical attention for recurrent vomiting, abdominal pain; new fever, diarrhea or worsening of health.   Activity as tolerated - No restrictions          Medication List     As of 06/17/2012 12:06 PM    TAKE these medications         ciprofloxacin 500 MG tablet   Commonly known as: CIPRO   Take 1 tablet (500 mg total) by mouth 2 (two) times daily. Start 9/21 in evening.      ferrous sulfate 325 (65 FE) MG tablet   Take 325 mg by mouth daily with breakfast.      ibuprofen 200 MG tablet   Commonly known as: ADVIL,MOTRIN   Take 400 mg by mouth every 6 (six) hours as needed.      lisinopril-hydrochlorothiazide 20-12.5 MG per tablet   Commonly known as: PRINZIDE,ZESTORETIC   take 2 tablets by mouth once daily      metroNIDAZOLE 500 MG tablet   Commonly known as: FLAGYL   Take 1 tablet (500 mg total) by mouth 3 (three) times daily. Start 9/21 in the evening.  omeprazole 20 MG capsule   Commonly known as: PRILOSEC   Take 20 mg by mouth daily.        The results of significant diagnostics from this hospitalization (including imaging, microbiology, ancillary and laboratory) are listed below for reference.    Significant Diagnostic Studies: Ct Abdomen Pelvis W Contrast  06/13/2012  *RADIOLOGY REPORT*  Clinical Data: Abdominal pain.   CT ABDOMEN AND PELVIS WITH CONTRAST  Technique:  Multidetector CT imaging of the abdomen and pelvis was performed following the standard protocol during bolus administration of intravenous contrast.  Contrast: OMNIPAQUE IOHEXOL 300 MG/ML  SOLN  Comparison: None.  Findings: The lung bases are clear. There is cardiomegaly.  No pleural or pericardial effusion.  There are multiple loops of abnormal small bowel with wall thickening and hyperenhancement identified.  There is some perihepatic, mesenteric and free pelvic fluid.  No focal fluid collection is identified.  No pneumatosis, portal venous gas or free intraperitoneal air is seen.  The celiac axis and superior and inferior mesenteric arteries all opacified normally.  No notable vascular disease is identified.  The colon is largely decompressed but otherwise unremarkable.  The appendix appears normal.  The stomach is nearly completely decompressed but otherwise unremarkable.  The gallbladder, liver, spleen, adrenal glands, pancreas and left kidney appear normal.  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 No lymphadenopathy.  Fibroid uterus is noted.  Urinary bladder is unremarkable.  No focal bony abnormality.  IMPRESSION:  1.  Multiple abnormal loops of small bowel with scattered free abdominal fluid most consistent with infectious or inflammatory enteritis.  No finding to suggest ischemia is identified.  Note is made that the terminal ileum appears normal. 2.  Cardiomegaly. 3.  Fibroid uterus. 4.  Duplicated right renal collecting system is noted.   Original Report Authenticated By: Bernadene Bell. D'ALESSIO, M.D.    Dg Abd 2 Views  06/14/2012  *RADIOLOGY REPORT*  Clinical Data: Nausea, vomiting, abdominal pain, follow-up enteritis  ABDOMEN - 2 VIEW  Comparison: None Correlation:  CT abdomen and pelvis 06/13/2012  Findings: Biconvex thoracolumbar scoliosis. Scattered pelvic phleboliths.  Paucity of bowel gas. No definite bowel wall thickening, bowel dilatation, or free intraperitoneal air. Lung bases clear.  IMPRESSION: No bowel dilatation or evidence of obstruction identified. Bowel wall thickening identified on the CT of 06/13/2012 cannot be assessed on current exam due to the lack of bowel gas.   Original Report Authenticated By: Lollie Marrow, M.D.     Microbiology: Recent Results (from the past 240 hour(s))  URINE CULTURE     Status: Normal   Collection Time   06/15/12  6:04 AM      Component Value Range Status Comment   Specimen Description URINE, RANDOM   Final    Special Requests NONE   Final    Culture  Setup Time 06/15/2012 08:26   Final    Colony Count NO GROWTH   Final    Culture NO GROWTH   Final    Report Status 06/16/2012 FINAL   Final      Labs: Basic Metabolic Panel:  Lab 06/17/12 1610 06/16/12 0437 06/15/12 0437 06/14/12 0450 06/13/12 1850  NA 136 136 137 137 133*  K 3.0* 3.3* 3.5 3.9 3.5  CL 102 102 102 100 94*  CO2 25 21 25 26 26   GLUCOSE 106* 131* 114* 140* 161*  BUN 5* 6 8 10 11   CREATININE 0.84 0.91 0.97  0.95 0.95  CALCIUM 7.6* 7.8* 7.9* 8.7 10.0  MG -- -- -- -- --  PHOS -- -- -- -- --   Liver Function Tests:  Lab 06/15/12 0437 06/13/12 1850  AST 7 11  ALT 5 6  ALKPHOS 40 52  BILITOT 0.3 0.3  PROT 6.1 8.4*  ALBUMIN 2.5* 3.5    Lab 06/13/12 1850  LIPASE 15  AMYLASE --   CBC:  Lab 06/17/12 0616 06/16/12 0437 06/15/12 0437 06/14/12 0450 06/13/12 1850  WBC 7.7 10.7* 11.5* 14.1* 14.2*  NEUTROABS -- -- -- -- 12.2*  HGB 9.9* 11.5* 11.4* 12.1 13.7  HCT 29.2* 33.4* 34.1* 36.4 39.5  MCV 80.0 79.9 81.2 80.7 79.2  PLT 265 307 353 376 445*   Principal Problem:  *Enteritis Active Problems:  HYPERTENSION, BENIGN  Abdominal pain, acute  Dehydration  Nausea & vomiting  Constipation   Time coordinating discharge: 25 minutes  Signed:  Brendia Sacks, MD [facility] 06/17/2012, 12:06 PM

## 2012-06-17 NOTE — Progress Notes (Signed)
DC instructions gone over with patient, questions answered, verbalized understanding.  Contacted [facility] to determine price of medications as patient does not have insurance, Cipro $4 med, Flagyl approximately $10-15 which patient says she can pay.  Stressed to patient that she must take all her medications which she voiced understanding.  Accessed patient's MyChart Account prior to discharge.

## 2012-06-17 NOTE — Progress Notes (Signed)
[facility] PROGRESS NOTE  Becky Vazquez NFA:[id number] DOB: 03-09-1971 DOA: 06/13/2012 PCP: Burtis Junes, MD  Assessment/Plan: 1. N/V--resolved. Presumed enteritis. Tolerating diet. Exam remains benign--no radiographic, lab or clinical evidence to suggest obstruction, gallbladder involvement, appendix.  2. Abdominal pain--resolved.  3. Acute enteritis--no diarrhea. Finish empiric Cipro, Flagyl. Terminal ileum appeared normal on CT. Doubt inflammatory etiology. No history of GI disease. 4. Hypokalemia--repleted. 5. Normocytic anemia--follow-up as outpatient, asymptomatic, no bleeding. Suspect dilutional. Resume outpatient iron on discharge. 6. HTN--stable.  Code Status: Full code Family Communication:  Disposition Plan: home today  Brendia Sacks, MD  [facility] Team 6 Pager [phone]. If 8PM-8AM, please contact night-coverage at [email or web address], password [facility] 06/17/2012, 11:55 AM  LOS: 4 days   Brief narrative: 41 year old female with history of hypertension presented to ED with acute abdominal pain since today morning. Patient reported that she was in her usual state of health this morning, ate breakfast and then started having intractable nausea and vomiting with abdominal pain. She describes abdominal pain as periumbilical and upper abdominal/RUQ crampy, intermittent 10/10 in intensity. She had intractable vomiting with multiple episodes, denies any hematemesis. She denies any similar symptoms in the family, any new dish or restaurant. She denies any fever, chills, back pain or flank pain.  Consultants:  None  Procedures:  None  Antibiotics:  Cipro 9/18>>9/24  Flagyl 9/18>>9/24  HPI/Subjective: Feels better, no vomiting, no pain. Ate omelet, yogurt this morning, [place] last night. Small BM. Wants to go home.  Objective: Filed Vitals:   06/16/12 0630 06/16/12 1415 06/16/12 2153 06/17/12 0700  BP: 128/80 137/88 139/79 149/82  Pulse: 80 85 80 74    Temp: 98.6 F (37 C) 98.6 F (37 C) 99.2 F (37.3 C) 98.4 F (36.9 C)  TempSrc: Oral Oral Oral Oral  Resp: 20 20 20 20   Height:      Weight:      SpO2: 98% 98% 100% 97%    Intake/Output Summary (Last 24 hours) at 06/17/12 1155 Last data filed at 06/16/12 2300  Gross per 24 hour  Intake    300 ml  Output      0 ml  Net    300 ml   Filed Weights   06/15/12 1100  Weight: 104.327 kg (230 lb)    Exam:  General:  Appears calm and comfortable   Cardiovascular: RRR, no m/r/g.   Respiratory: CTA bilaterally, no w/r/r. Normal respiratory effort.  Abdomen: soft, nd, positive bowel sounds (normal), non-tender  Psychiatric: grossly normal mood and affect, speech fluent and appropriate  Exam current 9/21  Data Reviewed: Basic Metabolic Panel:  Lab 06/17/12 2841 06/16/12 0437 06/15/12 0437 06/14/12 0450 06/13/12 1850  NA 136 136 137 137 133*  K 3.0* 3.3* 3.5 3.9 3.5  CL 102 102 102 100 94*  CO2 25 21 25 26 26   GLUCOSE 106* 131* 114* 140* 161*  BUN 5* 6 8 10 11   CREATININE 0.84 0.91 0.97 0.95 0.95  CALCIUM 7.6* 7.8* 7.9* 8.7 10.0  MG -- -- -- -- --  PHOS -- -- -- -- --   Liver Function Tests:  Lab 06/15/12 0437 06/13/12 1850  AST 7 11  ALT 5 6  ALKPHOS 40 52  BILITOT 0.3 0.3  PROT 6.1 8.4*  ALBUMIN 2.5* 3.5    Lab 06/13/12 1850  LIPASE 15  AMYLASE --   CBC:  Lab 06/17/12 0616 06/16/12 0437 06/15/12 0437 06/14/12 0450 06/13/12 1850  WBC 7.7 10.7* 11.5* 14.1* 14.2*  NEUTROABS -- -- -- --  12.2*  HGB 9.9* 11.5* 11.4* 12.1 13.7  HCT 29.2* 33.4* 34.1* 36.4 39.5  MCV 80.0 79.9 81.2 80.7 79.2  PLT 265 307 353 376 445*   Studies: No results found.  Scheduled Meds:    . ciprofloxacin  400 mg Intravenous BID  . influenza  inactive virus vaccine  0.5 mL Intramuscular Tomorrow-1000  . metronidazole  500 mg Intravenous Q8H  . pantoprazole (PROTONIX) IV  40 mg Intravenous Q12H  . potassium chloride  40 mEq Oral BID   Continuous Infusions:    . DISCONTD:  sodium chloride 50 mL/hr at 06/16/12 1232    Principal Problem:  *Enteritis Active Problems:  HYPERTENSION, BENIGN  Abdominal pain, acute  Dehydration  Nausea & vomiting  Constipation     Brendia Sacks, MD  [facility] Team 6 Pager [phone]. If 8PM-8AM, please contact night-coverage at [email or web address], password [facility] 06/17/2012, 11:55 AM  LOS: 4 days

## 2012-09-08 ENCOUNTER — Emergency Department (HOSPITAL_COMMUNITY): Payer: Self-pay

## 2012-09-08 ENCOUNTER — Emergency Department (HOSPITAL_COMMUNITY)
Admission: EM | Admit: 2012-09-08 | Discharge: 2012-09-08 | Disposition: A | Discharge status: Home / Self Care | Payer: Self-pay | Attending: Emergency Medicine | Admitting: Emergency Medicine

## 2012-09-08 ENCOUNTER — Encounter (HOSPITAL_COMMUNITY): Payer: Self-pay | Admitting: *Deleted

## 2012-09-08 DIAGNOSIS — Z79899 Other long term (current) drug therapy: Secondary | ICD-10-CM | POA: Insufficient documentation

## 2012-09-08 DIAGNOSIS — K529 Noninfective gastroenteritis and colitis, unspecified: Secondary | ICD-10-CM

## 2012-09-08 DIAGNOSIS — R1084 Generalized abdominal pain: Secondary | ICD-10-CM | POA: Insufficient documentation

## 2012-09-08 DIAGNOSIS — I10 Essential (primary) hypertension: Secondary | ICD-10-CM | POA: Insufficient documentation

## 2012-09-08 DIAGNOSIS — Z3202 Encounter for pregnancy test, result negative: Secondary | ICD-10-CM | POA: Insufficient documentation

## 2012-09-08 DIAGNOSIS — K5289 Other specified noninfective gastroenteritis and colitis: Secondary | ICD-10-CM | POA: Insufficient documentation

## 2012-09-08 LAB — URINALYSIS, ROUTINE W REFLEX MICROSCOPIC
Hgb urine dipstick: NEGATIVE
Nitrite: NEGATIVE
Protein, ur: 30 mg/dL — AB
Specific Gravity, Urine: 1.037 — ABNORMAL HIGH (ref 1.005–1.030)
Urobilinogen, UA: 1 mg/dL (ref 0.0–1.0)

## 2012-09-08 LAB — CBC WITH DIFFERENTIAL/PLATELET
Basophils Absolute: 0 10*3/uL (ref 0.0–0.1)
Eosinophils Absolute: 0.1 10*3/uL (ref 0.0–0.7)
Eosinophils Relative: 1 % (ref 0–5)
HCT: 38.4 % (ref 36.0–46.0)
MCH: 27.1 pg (ref 26.0–34.0)
MCV: 79.5 fL (ref 78.0–100.0)
Monocytes Absolute: 0.7 10*3/uL (ref 0.1–1.0)
Platelets: 452 10*3/uL — ABNORMAL HIGH (ref 150–400)
RDW: 13.7 % (ref 11.5–15.5)

## 2012-09-08 LAB — POCT PREGNANCY, URINE: Preg Test, Ur: NEGATIVE

## 2012-09-08 LAB — COMPREHENSIVE METABOLIC PANEL
ALT: 5 U/L (ref 0–35)
AST: 8 U/L (ref 0–37)
CO2: 27 mEq/L (ref 19–32)
Calcium: 9.6 mg/dL (ref 8.4–10.5)
Creatinine, Ser: 1.02 mg/dL (ref 0.50–1.10)
GFR calc Af Amer: 78 mL/min — ABNORMAL LOW (ref >90)
GFR calc non Af Amer: 67 mL/min — ABNORMAL LOW (ref >90)
Glucose, Bld: 136 mg/dL — ABNORMAL HIGH (ref 70–99)
Sodium: 130 mEq/L — ABNORMAL LOW (ref 135–145)
Total Protein: 8.1 g/dL (ref 6.0–8.3)

## 2012-09-08 LAB — URINE MICROSCOPIC-ADD ON

## 2012-09-08 MED ORDER — SODIUM CHLORIDE 0.9 % IV SOLN
Freq: Once | INTRAVENOUS | Status: AC
  Administered 2012-09-08: 05:00:00 via INTRAVENOUS

## 2012-09-08 MED ORDER — POTASSIUM CHLORIDE CRYS ER 20 MEQ PO TBCR
40.0000 meq | EXTENDED_RELEASE_TABLET | Freq: Once | ORAL | Status: AC
Start: 2012-09-08 — End: 2012-09-08
  Administered 2012-09-08: 40 meq via ORAL
  Filled 2012-09-08: qty 2

## 2012-09-08 MED ORDER — HYDROCODONE-ACETAMINOPHEN 5-500 MG PO TABS: 1.0000 | ORAL_TABLET | Freq: Four times a day (QID) | ORAL | Status: DC | PRN

## 2012-09-08 MED ORDER — METRONIDAZOLE 500 MG PO TABS
500.0000 mg | ORAL_TABLET | Freq: Once | ORAL | Status: AC
  Administered 2012-09-08: 500 mg via ORAL
  Filled 2012-09-08: qty 1

## 2012-09-08 MED ORDER — PROMETHAZINE HCL 25 MG PO TABS: 25.0000 mg | ORAL_TABLET | Freq: Four times a day (QID) | ORAL | Status: DC | PRN

## 2012-09-08 MED ORDER — SODIUM CHLORIDE 0.9 % IV BOLUS (SEPSIS)
1000.0000 mL | Freq: Once | INTRAVENOUS | Status: AC
  Administered 2012-09-08: 1000 mL via INTRAVENOUS

## 2012-09-08 MED ORDER — HYDROCODONE-ACETAMINOPHEN 5-325 MG PO TABS
1.0000 | ORAL_TABLET | Freq: Once | ORAL | Status: AC
  Administered 2012-09-08: 1 via ORAL
  Filled 2012-09-08: qty 1

## 2012-09-08 MED ORDER — CIPROFLOXACIN HCL 500 MG PO TABS: 500.0000 mg | ORAL_TABLET | Freq: Two times a day (BID) | ORAL | Status: DC

## 2012-09-08 MED ORDER — ONDANSETRON HCL 4 MG/2ML IJ SOLN
4.0000 mg | Freq: Once | INTRAMUSCULAR | Status: AC
  Administered 2012-09-08: 4 mg via INTRAVENOUS
  Filled 2012-09-08: qty 2

## 2012-09-08 MED ORDER — METRONIDAZOLE 500 MG PO TABS: 500.0000 mg | ORAL_TABLET | Freq: Three times a day (TID) | ORAL | Status: DC

## 2012-09-08 MED ORDER — CIPROFLOXACIN HCL 500 MG PO TABS
500.0000 mg | ORAL_TABLET | Freq: Once | ORAL | Status: AC
  Administered 2012-09-08: 500 mg via ORAL
  Filled 2012-09-08: qty 1

## 2012-09-08 MED ORDER — MORPHINE SULFATE 4 MG/ML IJ SOLN
4.0000 mg | Freq: Once | INTRAMUSCULAR | Status: AC
  Administered 2012-09-08: 4 mg via INTRAVENOUS
  Filled 2012-09-08: qty 1

## 2012-09-08 NOTE — ED Notes (Signed)
Pt c/o vomiting; abd pain

## 2012-09-08 NOTE — ED Provider Notes (Signed)
History     CSN: [id number]  Arrival date & time 09/08/12  0151   First MD Initiated Contact with Patient 09/08/12 0220      No chief complaint on file.   (Consider location/radiation/quality/duration/timing/severity/associated sxs/prior treatment) HPI Becky Vazquez is a 41 y.o. female who presents with complaint of abdominal pain, nausea, vomiting onset this afternoon. Pt states symptoms began with pain. States later in the evening began vomiting, states threw up 4 times. denies any diarrhea. Denies fever. Denies urinary symptoms. Stats about half a year ago, was admitted for enteritis, states this feels the same. Pt took ibuprofen but vomited it right after taking. Pt states pain is diffuse, all over abdomen, constant, sharp. Nothing making it better, palpation and movement makes it worse.   Past Medical History  Diagnosis Date  . Hypertension     Past Surgical History  Procedure Date  . Tubal ligation     No family history on file.  History  Substance Use Topics  . Smoking status: Never Smoker   . Smokeless tobacco: Never Used  . Alcohol Use: No    OB History    Grav Para Term Preterm Abortions TAB SAB Ect Mult Living                  Review of Systems  Constitutional: Negative for fever and chills.  Respiratory: Negative.   Cardiovascular: Negative.   Gastrointestinal: Positive for nausea, vomiting and abdominal pain. Negative for diarrhea and constipation.  Genitourinary: Negative for dysuria and flank pain.  All other systems reviewed and are negative.    Allergies  Review of patient's allergies indicates no known allergies.  Home Medications   Current Outpatient Rx  Name  Route  Sig  Dispense  Refill  . IBUPROFEN 200 MG PO TABS   Oral   Take 400 mg by mouth every 6 (six) hours as needed. For pain         . LISINOPRIL-HYDROCHLOROTHIAZIDE 20-12.5 MG PO TABS   Oral   Take 2 tablets by mouth daily.         Azzie Roup ACE-ETH ESTRAD-FE 1.5-30  MG-MCG PO TABS   Oral   Take 1 tablet by mouth daily.         Marland Kitchen OMEPRAZOLE 20 MG PO CPDR   Oral   Take 20 mg by mouth daily as needed. For acid reflux or heartburn           BP 122/78  Pulse 120  Temp 98.3 F (36.8 C) (Oral)  Resp 18  Ht 5\' 5"  (1.651 m)  Wt 230 lb (104.327 kg)  BMI 38.27 kg/m2  SpO2 99%  LMP 08/27/2012  Physical Exam  Nursing note and vitals reviewed. Constitutional: She is oriented to person, place, and time. She appears well-developed and well-nourished. No distress.  HENT:  Head: Normocephalic.  Eyes: Conjunctivae normal are normal.  Neck: Neck supple.  Cardiovascular: Regular rhythm and normal heart sounds.        tachycardic  Pulmonary/Chest: Effort normal and breath sounds normal. No respiratory distress. She has no wheezes. She has no rales.  Abdominal: Soft. Bowel sounds are normal. She exhibits no distension. There is tenderness. There is no rebound and no guarding.       Diffuse tenderness, worse in RUQ and LLQ  Musculoskeletal: Normal range of motion. She exhibits no edema.  Neurological: She is alert and oriented to person, place, and time.  Skin: Skin is warm and dry.  Psychiatric:  She has a normal mood and affect. Her behavior is normal.    ED Course  Procedures (including critical care time)  Pt with hx of enteritis, here with similar symptoms. VS show tachycardia, otherwise normal. Will start fluids, labs. Zofran and morphine ordered.   Results for orders placed during the hospital encounter of 09/08/12  CBC WITH DIFFERENTIAL      Component Value Range   WBC 11.1 (*) 4.0 - 10.5 K/uL   RBC 4.83  3.87 - 5.11 MIL/uL   Hemoglobin 13.1  12.0 - 15.0 g/dL   HCT 45.4  09.8 - 11.9 %   MCV 79.5  78.0 - 100.0 fL   MCH 27.1  26.0 - 34.0 pg   MCHC 34.1  30.0 - 36.0 g/dL   RDW 14.7  82.9 - 56.2 %   Platelets 452 (*) 150 - 400 K/uL   Neutrophils Relative 69  43 - 77 %   Neutro Abs 7.7  1.7 - 7.7 K/uL   Lymphocytes Relative 23  12 - 46 %    Lymphs Abs 2.6  0.7 - 4.0 K/uL   Monocytes Relative 7  3 - 12 %   Monocytes Absolute 0.7  0.1 - 1.0 K/uL   Eosinophils Relative 1  0 - 5 %   Eosinophils Absolute 0.1  0.0 - 0.7 K/uL   Basophils Relative 0  0 - 1 %   Basophils Absolute 0.0  0.0 - 0.1 K/uL  COMPREHENSIVE METABOLIC PANEL      Component Value Range   Sodium 130 (*) 135 - 145 mEq/L   Potassium 3.2 (*) 3.5 - 5.1 mEq/L   Chloride 92 (*) 96 - 112 mEq/L   CO2 27  19 - 32 mEq/L   Glucose, Bld 136 (*) 70 - 99 mg/dL   BUN 10  6 - 23 mg/dL   Creatinine, Ser 1.30  0.50 - 1.10 mg/dL   Calcium 9.6  8.4 - 86.5 mg/dL   Total Protein 8.1  6.0 - 8.3 g/dL   Albumin 3.3 (*) 3.5 - 5.2 g/dL   AST 8  0 - 37 U/L   ALT 5  0 - 35 U/L   Alkaline Phosphatase 55  39 - 117 U/L   Total Bilirubin 0.4  0.3 - 1.2 mg/dL   GFR calc non Af Amer 67 (*) >90 mL/min   GFR calc Af Amer 78 (*) >90 mL/min  LIPASE, BLOOD      Component Value Range   Lipase 16  11 - 59 U/L  URINALYSIS, ROUTINE W REFLEX MICROSCOPIC      Component Value Range   Color, Urine AMBER (*) YELLOW   APPearance CLOUDY (*) CLEAR   Specific Gravity, Urine 1.037 (*) 1.005 - 1.030   pH 5.0  5.0 - 8.0   Glucose, UA NEGATIVE  NEGATIVE mg/dL   Hgb urine dipstick NEGATIVE  NEGATIVE   Bilirubin Urine SMALL (*) NEGATIVE   Ketones, ur TRACE (*) NEGATIVE mg/dL   Protein, ur 30 (*) NEGATIVE mg/dL   Urobilinogen, UA 1.0  0.0 - 1.0 mg/dL   Nitrite NEGATIVE  NEGATIVE   Leukocytes, UA SMALL (*) NEGATIVE  POCT PREGNANCY, URINE      Component Value Range   Preg Test, Ur NEGATIVE  NEGATIVE  URINE MICROSCOPIC-ADD ON      Component Value Range   Squamous Epithelial / LPF MANY (*) RARE   WBC, UA 3-6  <3 WBC/hpf   RBC / HPF 0-2  <3 RBC/hpf  Bacteria, UA FEW (*) RARE   Urine-Other MUCOUS PRESENT     Dg Abd Acute W/chest  09/08/2012  *RADIOLOGY REPORT*  Clinical Data: Mid abdominal pain for 24 hours.  ACUTE ABDOMEN SERIES (ABDOMEN 2 VIEW & CHEST 1 VIEW)  Comparison: 06/14/2012  Findings:  Normal heart size and pulmonary vascularity.  No focal consolidation in the lungs.  No blunting of costophrenic angles.  Scattered gas and stool in the colon.  No small or large bowel dilatation.  No free intra-abdominal air.  No abnormal air fluid levels.  No radiopaque stones.  No significant change since previous study.  IMPRESSION: No evidence of active pulmonary disease.  Nonobstructive bowel gas pattern.   Original Report Authenticated By: Burman Nieves, M.D.     4:30 AM  Pt feeling better. She states pain and nausea resolved. Will recheck VS. PO trial. Potassium replaced.   5:18 AM Pt tolerating POs. She continues to have no nausea, no pain. Abdomen reassessed. Soft. Nontender. Pt stable for d/c home. Will cover for possible enteritis with cipro and flaglyl as well as pain and nausea medications at home.   1. Gastroenteritis       MDM  Pt with nausea, vomiting, abdominal pain. Hx of enteritis, feels the same. WBC slightly elevated at 11.1. Pt hydrated with two L of NS. Her HR improved from 120 to 70s. Pain and nausea improved with just one dose of morphine 4mg  and zofran 4mg  IV. Pt's abdomen is benign. No acute abdomen. Pt is tolerating PO fluids. Potassium replenished. First does of cipro and flagyl given PO for possible enteritis. Will d/c home with trial of outpatient therapy, she is agreeable to this plan. Will follow up with PCP. Pt is to return if worsening.   Filed Vitals:   09/08/12 0200 09/08/12 0415  BP: 122/78 132/76  Pulse: 120 77  Temp: 98.3 F (36.8 C)   TempSrc: Oral   Resp: 18   Height: 5\' 5"  (1.651 m)   Weight: 230 lb (104.327 kg)   SpO2: 99% 99%           Lottie Mussel, PA 09/08/12 [phone]

## 2012-09-08 NOTE — ED Provider Notes (Signed)
  Medical screening examination/treatment/procedure(s) were performed by non-physician practitioner and as supervising physician I was immediately available for consultation/collaboration.    Vida Roller, MD 09/08/12 2350

## 2012-09-08 NOTE — Discharge Instructions (Signed)
Continue to drink plenty of fluids at home. Advance diet tonight as tolerate. See print out on BRAT diet. Phenergan for nausea. Vicodin as prescribed as needed for pain. Start cipro and flagyl as prescribed, and take until all gone. Follow up with your doctor in 3 days for recheck. Return if pain or vomiting worsening, unable to keep medications down, if develop high fever, or any new concerning symptoms.     Clear Liquid Diet The clear liquid dietconsists of foods that are liquid or will become liquid at room temperature.You should be able to see through the liquid and beverages. Examples of foods allowed on a clear liquid diet include fruit juice, broth or bouillon, gelatin, or frozen ice pops. The purpose of this diet is to provide necessary fluid, electrolytes such as sodium and potassium, and energy to keep the body functioning during times when you are not able to consume a regular diet.A clear liquid diet should not be continued for long periods of time as it is not nutritionally adequate.  REASONS FOR USING A CLEAR LIQUID DIET  In sudden onset (acute) conditions for a patient before or after surgery.  As the first step in oral feeding.  For fluid and electrolyte replacement in diarrheal diseases.  As a diet before certain medical tests are performed. ADEQUACY The clear liquid diet is adequate only in ascorbic acid, according to the Recommended Dietary Allowances of the [employer]. CHOOSING FOODS Breads and Starches  Allowed:  None are allowed.  Avoid: All are avoided. Vegetables  Allowed:  Strained tomato or vegetable juice.  Avoid: Any others. Fruit  Allowed:  Strained fruit juices and fruit drinks. Include 1 serving of citrus or vitamin C-enriched fruit juice daily.  Avoid: Any others. Meat and Meat Substitutes  Allowed:  None are allowed.  Avoid: All are avoided. Milk  Allowed:  None are allowed.  Avoid: All are avoided. Soups and Combination  Foods  Allowed:  Clear bouillon, broth, or strained broth-based soups.  Avoid: Any others. Desserts and Sweets  Allowed:  Sugar, honey. High protein gelatin. Flavored gelatin, ices, or frozen ice pops that do not contain milk.  Avoid: Any others. Fats and Oils  Allowed:  None are allowed.  Avoid: All are avoided. Beverages  Allowed: Cereal beverages, coffee (regular or decaffeinated), tea, or soda at the discretion of your caregiver.  Avoid: Any others. Condiments  Allowed:  Iodized salt.  Avoid: Any others, including pepper. Supplements  Allowed:  Liquid nutrition beverages.  Avoid: Any others that contain lactose or fiber. SAMPLE MEAL PLAN Breakfast  4 oz (120 mL) strained orange juice.   to 1 cup (125 to 250 mL) gelatin (plain or fortified).  1 cup (250 mL) beverage (coffee or tea).  Sugar, if desired. Midmorning Snack   cup (125 mL) gelatin (plain or fortified). Lunch  1 cup (250 mL) broth or consomm.  4 oz (120 mL) strained grapefruit juice.   cup (125 mL) gelatin (plain or fortified).  1 cup (250 mL) beverage (coffee or tea).  Sugar, if desired. Midafternoon Snack   cup (125 mL) fruit ice.   cup (125 mL) strained fruit juice. Dinner  1 cup (250 mL) broth or consomm.   cup (125 mL) cranberry juice.   cup (125 mL) flavored gelatin (plain or fortified).  1 cup (250 mL) beverage (coffee or tea).  Sugar, if desired. Evening Snack  4 oz (120 mL) strained apple juice (vitamin C-fortified).   cup (125 mL) flavored gelatin (  plain or fortified). Document Released: 09/13/2005 Document Revised: 12/06/2011 Document Reviewed: 12/11/2010 [facility] Patient Information 2013 [place], [place].  B.R.A.T. Diet Your doctor has recommended the B.R.A.T. diet for you or your child until the condition improves. This is often used to help control diarrhea and vomiting symptoms. If you or your child can tolerate clear liquids, you may have:  Bananas.    Rice.   Applesauce.   Toast (and other simple starches such as crackers, potatoes, noodles).  Be sure to avoid dairy products, meats, and fatty foods until symptoms are better. Fruit juices such as apple, grape, and prune juice can make diarrhea worse. Avoid these. Continue this diet for 2 days or as instructed by your caregiver. Document Released: 09/13/2005 Document Revised: 09/02/2011 Document Reviewed: 03/02/2007 [facility] Patient Information 2012 [place], [place].

## 2012-09-08 NOTE — ED Notes (Addendum)
Patient states this feels like the pain she had in September when she had her stomach infection. Saw Dr Claretha Cooper in January for uterine fibroids, due to be rechecked in January 2014.

## 2012-09-09 LAB — URINE CULTURE
Colony Count: NO GROWTH
Culture: NO GROWTH

## 2012-09-25 ENCOUNTER — Other Ambulatory Visit (HOSPITAL_COMMUNITY): Payer: Self-pay | Admitting: Internal Medicine

## 2012-09-25 DIAGNOSIS — Z1231 Encounter for screening mammogram for malignant neoplasm of breast: Secondary | ICD-10-CM

## 2012-10-06 ENCOUNTER — Encounter (HOSPITAL_COMMUNITY): Payer: Self-pay | Admitting: Emergency Medicine

## 2012-10-06 ENCOUNTER — Emergency Department (HOSPITAL_COMMUNITY)
Admission: EM | Admit: 2012-10-06 | Discharge: 2012-10-07 | Disposition: A | Discharge status: Home / Self Care | Payer: Self-pay | Attending: Emergency Medicine | Admitting: Emergency Medicine

## 2012-10-06 DIAGNOSIS — Z3202 Encounter for pregnancy test, result negative: Secondary | ICD-10-CM | POA: Insufficient documentation

## 2012-10-06 DIAGNOSIS — I10 Essential (primary) hypertension: Secondary | ICD-10-CM | POA: Insufficient documentation

## 2012-10-06 DIAGNOSIS — Z79899 Other long term (current) drug therapy: Secondary | ICD-10-CM | POA: Insufficient documentation

## 2012-10-06 DIAGNOSIS — R112 Nausea with vomiting, unspecified: Secondary | ICD-10-CM | POA: Insufficient documentation

## 2012-10-06 LAB — CBC
HCT: 37.1 % (ref 36.0–46.0)
MCHC: 34 g/dL (ref 30.0–36.0)
Platelets: 370 10*3/uL (ref 150–400)
RDW: 13.4 % (ref 11.5–15.5)
WBC: 12.3 10*3/uL — ABNORMAL HIGH (ref 4.0–10.5)

## 2012-10-06 MED ORDER — SODIUM CHLORIDE 0.9 % IV BOLUS (SEPSIS)
1000.0000 mL | Freq: Once | INTRAVENOUS | Status: AC
  Administered 2012-10-06: 1000 mL via INTRAVENOUS

## 2012-10-06 MED ORDER — HYDROMORPHONE HCL PF 1 MG/ML IJ SOLN
1.0000 mg | INTRAMUSCULAR | Status: DC | PRN
  Administered 2012-10-07: 1 mg via INTRAVENOUS
  Filled 2012-10-06: qty 1

## 2012-10-06 MED ORDER — PROMETHAZINE HCL 25 MG/ML IJ SOLN
25.0000 mg | INTRAMUSCULAR | Status: DC | PRN
  Filled 2012-10-06: qty 1

## 2012-10-06 MED ORDER — ONDANSETRON HCL 4 MG/2ML IJ SOLN
4.0000 mg | Freq: Once | INTRAMUSCULAR | Status: AC
  Administered 2012-10-06: 4 mg via INTRAVENOUS
  Filled 2012-10-06: qty 2

## 2012-10-06 NOTE — ED Provider Notes (Signed)
History     CSN: [id number]  Arrival date & time 10/06/12  2209   First MD Initiated Contact with Patient 10/06/12 2311      Chief Complaint  Patient presents with  . Abdominal Pain    (Consider location/radiation/quality/duration/timing/severity/associated sxs/prior treatment) HPI Hx per PT, onset today epigastric pain with N/V, pain sharp, mopd in severity and not radiating, no diarrhea, last BM today was normal NB/ NB emesis. No F/C, no recent Abx or travel, no known bad food exposures. No h/o same, no sick contacts, no rash, no radiating pain Past Medical History  Diagnosis Date  . Hypertension     Past Surgical History  Procedure Date  . Tubal ligation     No family history on file.  History  Substance Use Topics  . Smoking status: Never Smoker   . Smokeless tobacco: Never Used  . Alcohol Use: No    OB History    Grav Para Term Preterm Abortions TAB SAB Ect Mult Living                  Review of Systems  Constitutional: Negative for fever and chills.  HENT: Negative for neck pain and neck stiffness.   Eyes: Negative for pain.  Respiratory: Negative for shortness of breath.   Cardiovascular: Negative for chest pain.  Gastrointestinal: Positive for abdominal pain. Negative for blood in stool.  Genitourinary: Negative for dysuria.  Musculoskeletal: Negative for back pain.  Skin: Negative for rash.  Neurological: Negative for headaches.  All other systems reviewed and are negative.    Allergies  Review of patient's allergies indicates no known allergies.  Home Medications   Current Outpatient Rx  Name  Route  Sig  Dispense  Refill  . HYDROCODONE-ACETAMINOPHEN 5-500 MG PO TABS   Oral   Take 1-2 tablets by mouth every 6 (six) hours as needed for pain.   20 tablet   0   . LISINOPRIL-HYDROCHLOROTHIAZIDE 20-12.5 MG PO TABS   Oral   Take 2 tablets by mouth every morning.          Azzie Roup ACE-ETH ESTRAD-FE 1.5-30 MG-MCG PO TABS   Oral   Take  1 tablet by mouth daily.         Marland Kitchen OMEPRAZOLE 20 MG PO CPDR   Oral   Take 20 mg by mouth daily as needed. For acid reflux or heartburn         . PROMETHAZINE HCL 25 MG PO TABS   Oral   Take 1 tablet (25 mg total) by mouth every 6 (six) hours as needed for nausea.   30 tablet   0     BP 114/75  Pulse 110  Temp 97 F (36.1 C) (Oral)  Resp 16  SpO2 99%  LMP 09/17/2012  Physical Exam  Constitutional: She is oriented to person, place, and time. She appears well-developed and well-nourished.  HENT:  Head: Normocephalic and atraumatic.       Mildly dry mm  Eyes: EOM are normal. Pupils are equal, round, and reactive to light. No scleral icterus.  Neck: Neck supple.  Cardiovascular: Regular rhythm and intact distal pulses.        tachycardic  Pulmonary/Chest: Effort normal and breath sounds normal. No respiratory distress.  Abdominal: Soft. Bowel sounds are normal. She exhibits no distension. There is no rebound and no guarding.       Epigastric TTP neg Murphys sign and no tenderness otherwise.   Musculoskeletal: Normal range of  motion. She exhibits no edema.  Neurological: She is alert and oriented to person, place, and time.  Skin: Skin is warm and dry.    ED Course  Procedures (including critical care time)  Results for orders placed during the hospital encounter of 10/06/12  CBC      Component Value Range   WBC 12.3 (*) 4.0 - 10.5 K/uL   RBC 4.70  3.87 - 5.11 MIL/uL   Hemoglobin 12.6  12.0 - 15.0 g/dL   HCT 11.9  14.7 - 82.9 %   MCV 78.9  78.0 - 100.0 fL   MCH 26.8  26.0 - 34.0 pg   MCHC 34.0  30.0 - 36.0 g/dL   RDW 56.2  13.0 - 86.5 %   Platelets 370  150 - 400 K/uL  LIPASE, BLOOD      Component Value Range   Lipase 15  11 - 59 U/L  COMPREHENSIVE METABOLIC PANEL      Component Value Range   Sodium 136  135 - 145 mEq/L   Potassium 3.3 (*) 3.5 - 5.1 mEq/L   Chloride 97  96 - 112 mEq/L   CO2 27  19 - 32 mEq/L   Glucose, Bld 145 (*) 70 - 99 mg/dL   BUN 13   6 - 23 mg/dL   Creatinine, Ser 7.84  0.50 - 1.10 mg/dL   Calcium 9.5  8.4 - 69.6 mg/dL   Total Protein 8.1  6.0 - 8.3 g/dL   Albumin 3.2 (*) 3.5 - 5.2 g/dL   AST 8  0 - 37 U/L   ALT 6  0 - 35 U/L   Alkaline Phosphatase 51  39 - 117 U/L   Total Bilirubin 0.4  0.3 - 1.2 mg/dL   GFR calc non Af Amer 78 (*) >90 mL/min   GFR calc Af Amer >90  >90 mL/min  PREGNANCY, URINE      Component Value Range   Preg Test, Ur NEGATIVE  NEGATIVE    IVFs, IV dilaudid, IV zofran  Serial evaluations of abdomen without peritonitis. Nausea improved and patient given oral potassium for mild hypokalemia. She tolerates liquids without further nausea or vomiting and is requesting to be discharged.  Plan discharge home with primary care followup. Prescription for Phenergan and Pepcid provided.  Patient agrees to strict return precautions. No indication for CT abdomen and this time.   MDM  Epigastric pain with N/V improved with IVFs and medications as above. Old records reviewed - had similar presentation last month with ED evaluation. VS and nursing notes reviewed, labs reviewed no leukocytosis. UPT neg. Stable for outpatient follow up.        Sunnie Nielsen, MD 10/07/12 [phone]

## 2012-10-06 NOTE — ED Notes (Signed)
Pt alert, arrives from home, c/o gen abd pain, onset today, resp even unlabored, skin pwd, denies changes in bowel or bladder

## 2012-10-07 LAB — COMPREHENSIVE METABOLIC PANEL
ALT: 6 U/L (ref 0–35)
Albumin: 3.2 g/dL — ABNORMAL LOW (ref 3.5–5.2)
Calcium: 9.5 mg/dL (ref 8.4–10.5)
GFR calc Af Amer: >90 mL/min (ref >90)
Glucose, Bld: 145 mg/dL — ABNORMAL HIGH (ref 70–99)
Potassium: 3.3 mEq/L — ABNORMAL LOW (ref 3.5–5.1)
Sodium: 136 mEq/L (ref 135–145)
Total Protein: 8.1 g/dL (ref 6.0–8.3)

## 2012-10-07 LAB — LIPASE, BLOOD: Lipase: 15 U/L (ref 11–59)

## 2012-10-07 MED ORDER — PANTOPRAZOLE SODIUM 40 MG IV SOLR: 40.0000 mg | Freq: Once | INTRAVENOUS | Status: DC

## 2012-10-07 MED ORDER — POTASSIUM CHLORIDE 20 MEQ/15ML (10%) PO LIQD
20.0000 meq | Freq: Once | ORAL | Status: AC
  Administered 2012-10-07: 20 meq via ORAL
  Filled 2012-10-07: qty 15

## 2012-10-07 MED ORDER — FAMOTIDINE 20 MG PO TABS: 20.0000 mg | ORAL_TABLET | Freq: Two times a day (BID) | ORAL | Status: DC

## 2012-10-07 MED ORDER — PROMETHAZINE HCL 25 MG PO TABS: 25.0000 mg | ORAL_TABLET | Freq: Four times a day (QID) | ORAL | Status: DC | PRN

## 2012-10-07 NOTE — ED Notes (Signed)
Discharge instructions reviewed. Rx given x2. All questions answered.  

## 2012-10-07 NOTE — ED Notes (Signed)
Patient given ginger ale. 

## 2012-10-10 ENCOUNTER — Ambulatory Visit (HOSPITAL_COMMUNITY)
Admission: RE | Admit: 2012-10-10 | Discharge: 2012-10-10 | Disposition: A | Discharge status: Home / Self Care | Payer: Self-pay | Source: Ambulatory Visit | Attending: Internal Medicine | Admitting: Internal Medicine

## 2012-10-10 DIAGNOSIS — Z1231 Encounter for screening mammogram for malignant neoplasm of breast: Secondary | ICD-10-CM

## 2012-11-05 ENCOUNTER — Encounter (HOSPITAL_COMMUNITY): Payer: Self-pay | Admitting: Emergency Medicine

## 2012-11-05 ENCOUNTER — Emergency Department (HOSPITAL_COMMUNITY): Payer: Self-pay

## 2012-11-05 ENCOUNTER — Emergency Department (HOSPITAL_COMMUNITY)
Admission: EM | Admit: 2012-11-05 | Discharge: 2012-11-06 | Disposition: A | Discharge status: Home / Self Care | Payer: Self-pay | Attending: Emergency Medicine | Admitting: Emergency Medicine

## 2012-11-05 DIAGNOSIS — K2970 Gastritis, unspecified, without bleeding: Secondary | ICD-10-CM | POA: Insufficient documentation

## 2012-11-05 DIAGNOSIS — Z79899 Other long term (current) drug therapy: Secondary | ICD-10-CM | POA: Insufficient documentation

## 2012-11-05 DIAGNOSIS — R1110 Vomiting, unspecified: Secondary | ICD-10-CM | POA: Insufficient documentation

## 2012-11-05 DIAGNOSIS — E669 Obesity, unspecified: Secondary | ICD-10-CM | POA: Insufficient documentation

## 2012-11-05 DIAGNOSIS — I10 Essential (primary) hypertension: Secondary | ICD-10-CM | POA: Insufficient documentation

## 2012-11-05 DIAGNOSIS — K2990 Gastroduodenitis, unspecified, without bleeding: Secondary | ICD-10-CM | POA: Insufficient documentation

## 2012-11-05 DIAGNOSIS — Z3202 Encounter for pregnancy test, result negative: Secondary | ICD-10-CM | POA: Insufficient documentation

## 2012-11-05 LAB — URINALYSIS, ROUTINE W REFLEX MICROSCOPIC
Nitrite: NEGATIVE
pH: 5.5 (ref 5.0–8.0)

## 2012-11-05 LAB — CBC WITH DIFFERENTIAL/PLATELET
Basophils Relative: 0 % (ref 0–1)
Hemoglobin: 13.6 g/dL (ref 12.0–15.0)
Lymphocytes Relative: 23 % (ref 12–46)
Lymphs Abs: 3.2 10*3/uL (ref 0.7–4.0)
Monocytes Relative: 5 % (ref 3–12)
Neutro Abs: 9.9 10*3/uL — ABNORMAL HIGH (ref 1.7–7.7)
Neutrophils Relative %: 71 % (ref 43–77)
RBC: 4.95 MIL/uL (ref 3.87–5.11)
WBC: 13.9 10*3/uL — ABNORMAL HIGH (ref 4.0–10.5)

## 2012-11-05 LAB — LIPASE, BLOOD: Lipase: 12 U/L (ref 11–59)

## 2012-11-05 LAB — COMPREHENSIVE METABOLIC PANEL
Albumin: 3.2 g/dL — ABNORMAL LOW (ref 3.5–5.2)
Alkaline Phosphatase: 52 U/L (ref 39–117)
BUN: 10 mg/dL (ref 6–23)
Chloride: 96 mEq/L (ref 96–112)
Glucose, Bld: 125 mg/dL — ABNORMAL HIGH (ref 70–99)
Potassium: 3.4 mEq/L — ABNORMAL LOW (ref 3.5–5.1)
Total Bilirubin: 0.5 mg/dL (ref 0.3–1.2)

## 2012-11-05 MED ORDER — ONDANSETRON HCL 4 MG/2ML IJ SOLN
4.0000 mg | Freq: Once | INTRAMUSCULAR | Status: AC
  Administered 2012-11-05: 4 mg via INTRAVENOUS
  Filled 2012-11-05: qty 2

## 2012-11-05 MED ORDER — PANTOPRAZOLE SODIUM 40 MG IV SOLR
40.0000 mg | Freq: Once | INTRAVENOUS | Status: AC
  Administered 2012-11-05: 40 mg via INTRAVENOUS
  Filled 2012-11-05: qty 40

## 2012-11-05 MED ORDER — MORPHINE SULFATE 4 MG/ML IJ SOLN
4.0000 mg | Freq: Once | INTRAMUSCULAR | Status: AC
  Administered 2012-11-05: 4 mg via INTRAVENOUS
  Filled 2012-11-05: qty 1

## 2012-11-05 MED ORDER — ONDANSETRON HCL 4 MG/2ML IJ SOLN: 4.0000 mg | Freq: Once | INTRAMUSCULAR | Status: DC

## 2012-11-05 MED ORDER — SODIUM CHLORIDE 0.9 % IV BOLUS (SEPSIS)
1000.0000 mL | Freq: Once | INTRAVENOUS | Status: AC
  Administered 2012-11-05: 1000 mL via INTRAVENOUS

## 2012-11-05 MED ORDER — GI COCKTAIL ~~LOC~~
30.0000 mL | Freq: Once | ORAL | Status: AC
  Administered 2012-11-05: 30 mL via ORAL
  Filled 2012-11-05: qty 30

## 2012-11-05 NOTE — ED Notes (Signed)
Pt states that her abdominal pain started around 4pm today. Pt states she vomited once and is nauseated now.

## 2012-11-05 NOTE — ED Notes (Signed)
US at bedside

## 2012-11-05 NOTE — ED Provider Notes (Signed)
History     CSN: [id number]  Arrival date & time 11/05/12  1854   First MD Initiated Contact with Patient 11/05/12 2012      Chief Complaint  Patient presents with  . Abdominal Pain  . Emesis    (Consider location/radiation/quality/duration/timing/severity/associated sxs/prior treatment) HPI History provided by pt.   Pt presents w/ severe, intermittent, sharp pain in epigastrium since 3pm today.  Occasionally radiates around to right mid back.  Associated w/ 3 episodes of vomiting.  No aggravating/alleviating factors.  Denies fever, cough, CP, SOB, diarrhea, urinary and vaginal sx.  Has h/o acute enteritis is 05/2012 but pain was different at that time.  Per prior chart, pt was evaluated for similar sx in 08/2012 and 09/2012 and diagnosed w/ gastritis.   Past Medical History  Diagnosis Date  . Hypertension     Past Surgical History  Procedure Laterality Date  . Tubal ligation      No family history on file.  History  Substance Use Topics  . Smoking status: Never Smoker   . Smokeless tobacco: Never Used  . Alcohol Use: No    OB History   Grav Para Term Preterm Abortions TAB SAB Ect Mult Living                  Review of Systems  All other systems reviewed and are negative.    Allergies  Review of patient's allergies indicates no known allergies.  Home Medications   Current Outpatient Rx  Name  Route  Sig  Dispense  Refill  . famotidine (PEPCID) 20 MG tablet   Oral   Take 1 tablet (20 mg total) by mouth 2 (two) times daily.   30 tablet   0   . HYDROcodone-acetaminophen (VICODIN) 5-500 MG per tablet   Oral   Take 1-2 tablets by mouth every 6 (six) hours as needed for pain.   20 tablet   0   . lisinopril-hydrochlorothiazide (PRINZIDE,ZESTORETIC) 20-12.5 MG per tablet   Oral   Take 2 tablets by mouth every morning.          . norethindrone-ethinyl estradiol-iron (LOESTRIN FE 1.5/30) 1.5-30 MG-MCG tablet   Oral   Take 1 tablet by mouth daily.          . promethazine (PHENERGAN) 25 MG tablet   Oral   Take 1 tablet (25 mg total) by mouth every 6 (six) hours as needed for nausea.   30 tablet   0     BP 140/99  Pulse 100  Temp(Src) 0 F (-17.8 C)  Resp 19  SpO2 98%  LMP 10/13/2012  Physical Exam  Nursing note and vitals reviewed. Constitutional: She is oriented to person, place, and time. She appears well-developed and well-nourished. No distress.  HENT:  Head: Normocephalic and atraumatic.  Mouth/Throat: Oropharynx is clear and moist.  Eyes:  Normal appearance  Neck: Normal range of motion.  Cardiovascular: Normal rate and regular rhythm.   Pulmonary/Chest: Effort normal and breath sounds normal. No respiratory distress. She exhibits no tenderness.  Abdominal: Soft. Bowel sounds are normal. She exhibits no distension and no mass. There is no rebound and no guarding.  Obese. Epigastric ttp  Genitourinary:  No CVA tenderness  Musculoskeletal: Normal range of motion.  Neurological: She is alert and oriented to person, place, and time.  Skin: Skin is warm and dry. No rash noted.  Psychiatric: She has a normal mood and affect. Her behavior is normal.    ED Course  Procedures (including critical care time)  Labs Reviewed  CBC WITH DIFFERENTIAL - Abnormal; Notable for the following:    WBC 13.9 (*)    Neutro Abs 9.9 (*)    All other components within normal limits  COMPREHENSIVE METABOLIC PANEL - Abnormal; Notable for the following:    Sodium 134 (*)    Potassium 3.4 (*)    Glucose, Bld 125 (*)    Total Protein 8.4 (*)    Albumin 3.2 (*)    GFR calc non Af Amer 78 (*)    All other components within normal limits  URINALYSIS, ROUTINE W REFLEX MICROSCOPIC - Abnormal; Notable for the following:    APPearance CLOUDY (*)    Bilirubin Urine SMALL (*)    Ketones, ur TRACE (*)    All other components within normal limits  LIPASE, BLOOD  POCT PREGNANCY, URINE   US Abdomen Complete  11/05/2012  *RADIOLOGY REPORT*   Clinical Data:  Epigastric abdominal pain and vomiting.  ABDOMINAL ULTRASOUND COMPLETE  Comparison:  CT of the abdomen and pelvis performed 06/13/2012  Findings:  Gallbladder:  The gallbladder is normal in appearance, without evidence for gallstones, gallbladder wall thickening or pericholecystic fluid.  No ultrasonographic Murphy's sign is elicited.  Common Bile Duct:  0.3 cm in diameter; within normal limits in caliber.  Liver:  Normal parenchymal echogenicity and echotexture; no focal lesions identified.  Limited Doppler evaluation demonstrates normal blood flow within the liver.  IVC:  Unremarkable in appearance.  Pancreas:  Although the pancreas is difficult to visualize in its entirety due to overlying bowel gas, no focal pancreatic abnormality is identified.  Spleen:  8.7 cm in length; within normal limits in size and echotexture.  Right kidney:  13.6 cm in length; normal in size, configuration and parenchymal echogenicity.  No evidence of mass or hydronephrosis.  Left kidney:  12.0 cm in length; normal in size, configuration and parenchymal echogenicity.  No evidence of mass or hydronephrosis.  Abdominal Aorta:  Normal in caliber; no aneurysm identified.  IMPRESSION: Unremarkable abdominal ultrasound.   Original Report Authenticated By: Tonia Ghent, M.D.      1. Gastritis       MDM  41yo obese but otherwise healthy F presents w/ epigastric pain and N/V.  Has been seen for similar sx twice in past 2 months and diagnosed w/ gastritis.  On exam, afebrile, non-toxic appearing, epigastric ttp w/ guarding or peritoneal signs.   Labs pending.  Will [place] to r/o cholelithiasis d/t recurrent nature of sx as well as patient's age and body habitus.  IVF, morphine and zofran ordered for sx.  8:27 PM   [place] negative and labs unremarkable.  Discussed w/ patient.  She reports feeling better and she is tolerating pos.  D/c'd home w/ vicodin, promethazine and prilosec to treat gastritis.  Referred to GI.  Return  precautions discussed. 12:30 AM         Otilio Miu, PA-C 11/06/12 0030

## 2012-11-06 ENCOUNTER — Encounter: Payer: Self-pay | Admitting: Internal Medicine

## 2012-11-06 MED ORDER — OXYCODONE-ACETAMINOPHEN 5-325 MG PO TABS
1.0000 | ORAL_TABLET | Freq: Once | ORAL | Status: AC
  Administered 2012-11-06: 1 via ORAL
  Filled 2012-11-06: qty 1

## 2012-11-06 MED ORDER — OMEPRAZOLE 20 MG PO CPDR: 20.0000 mg | DELAYED_RELEASE_CAPSULE | Freq: Every day | ORAL | Status: DC

## 2012-11-06 MED ORDER — HYDROCODONE-ACETAMINOPHEN 5-325 MG PO TABS: 1.0000 | ORAL_TABLET | ORAL | Status: DC | PRN

## 2012-11-06 MED ORDER — ONDANSETRON 4 MG PO TBDP
4.0000 mg | ORAL_TABLET | Freq: Once | ORAL | Status: AC
  Administered 2012-11-06: 4 mg via ORAL
  Filled 2012-11-06: qty 1

## 2012-11-06 MED ORDER — PROMETHAZINE HCL 25 MG PO TABS: 25.0000 mg | ORAL_TABLET | Freq: Four times a day (QID) | ORAL | Status: DC | PRN

## 2012-11-06 NOTE — ED Provider Notes (Signed)
Medical screening examination/treatment/procedure(s) were performed by non-physician practitioner and as supervising physician I was immediately available for consultation/collaboration.   Flint Melter, MD 11/06/12 [phone]

## 2012-11-21 ENCOUNTER — Encounter: Payer: Self-pay | Admitting: Internal Medicine

## 2012-11-23 ENCOUNTER — Ambulatory Visit (INDEPENDENT_AMBULATORY_CARE_PROVIDER_SITE_OTHER): Payer: Self-pay | Admitting: Internal Medicine

## 2012-11-23 ENCOUNTER — Encounter: Payer: Self-pay | Admitting: Internal Medicine

## 2012-11-23 VITALS — BP 110/72 | HR 84 | Ht 66.0 in | Wt 243.2 lb

## 2012-11-23 DIAGNOSIS — R1013 Epigastric pain: Secondary | ICD-10-CM

## 2012-11-23 DIAGNOSIS — K219 Gastro-esophageal reflux disease without esophagitis: Secondary | ICD-10-CM

## 2012-11-23 DIAGNOSIS — K3189 Other diseases of stomach and duodenum: Secondary | ICD-10-CM

## 2012-11-23 MED ORDER — PANTOPRAZOLE SODIUM 40 MG PO TBEC: 40.0000 mg | DELAYED_RELEASE_TABLET | Freq: Every day | ORAL | Status: DC

## 2012-11-23 NOTE — Patient Instructions (Addendum)
You have been scheduled for an endoscopy and colonoscopy with propofol. Please follow the written instructions given to you at your visit today. Please pick up your prep at the pharmacy within the next 1-3 days. If you use inhalers (even only as needed) or a CPAP machine, please bring them with you on the day of your procedure.  We have sent the following medications to your pharmacy for you to pick up at your convenience: Pantoprazole 40 mg daily

## 2012-11-23 NOTE — Progress Notes (Signed)
Patient ID: Becky Vazquez, female   DOB: 12-13-1970, 42 y.o.   MRN: [id number]  SUBJECTIVE: HPI Becky Vazquez is a 42 yo female with PMH of HTN who is seen in consultation at the request of Dr. Roseanne Reno for evaluation of epigastric abdominal pain. The patient was hospitalized in September 2013 with abdominal pain along with nausea and vomiting. She had an abnormal CT scan which showed inflammation of the small bowel consistent with an infectious versus inflammatory enteritis. She was treated with ciprofloxacin and metronidazole and slowly her symptoms resolved. She's had episodic epigastric abdominal pain since this hospitalization, most recently necessitating ER visit in February 2014. She is given a GI cocktail for epigastric abdominal pain along with nausea. She found this to be helpful. She was given a prescription for omeprazole 20 mg daily which she took for 2 weeks. This did improve her symptoms somewhat, but she's continued to have intermittent epigastric abdominal pain worsened by eating. She's also noted some nausea associated with this pain, with rare vomiting. When she does vomit her pain seems to improve. She reports intermittent heartburn and water brash. No dysphagia or odynophagia. No change in bowel habits. No rectal bleeding or melena.  Review of Systems  As per history of present illness, otherwise negative   Past Medical History  Diagnosis Date  . Hypertension     Current Outpatient Prescriptions  Medication Sig Dispense Refill  . lisinopril-hydrochlorothiazide (PRINZIDE,ZESTORETIC) 20-12.5 MG per tablet Take 2 tablets by mouth every morning.       . Multiple Vitamin (MULTIVITAMIN) tablet Take 1 tablet by mouth daily.      . norethindrone-ethinyl estradiol-iron (LOESTRIN FE 1.5/30) 1.5-30 MG-MCG tablet Take 1 tablet by mouth daily.       No current facility-administered medications for this visit.    No Known Allergies  Family History  Problem Relation Age of Onset  .  Stomach cancer Mother   . Prostate cancer Father   . Breast cancer Maternal Aunt   . Breast cancer Paternal Aunt     History  Substance Use Topics  . Smoking status: Never Smoker   . Smokeless tobacco: Never Used  . Alcohol Use: No    OBJECTIVE: BP 110/72  Pulse 84  Ht 5\' 6"  (1.676 m)  Wt 243 lb 3.2 oz (110.315 kg)  BMI 39.27 kg/m2  LMP 11/14/2012 Constitutional: Well-developed and well-nourished. No distress. HEENT: Normocephalic and atraumatic. Oropharynx is clear and moist. No oropharyngeal exudate. Conjunctivae are normal. No scleral icterus. Neck: Neck supple. Trachea midline. Cardiovascular: Normal rate, regular rhythm and intact distal pulses. No M/R/G Pulmonary/chest: Effort normal and breath sounds normal. No wheezing, rales or rhonchi. Abdominal: Soft, nontender, nondistended. Bowel sounds active throughout. There are no masses palpable. No hepatosplenomegaly. Extremities: no clubbing, cyanosis, or edema Lymphadenopathy: No cervical adenopathy noted. Neurological: Alert and oriented to person place and time. Skin: Skin is warm and dry. No rashes noted. Psychiatric: Normal mood and affect. Behavior is normal.  Labs and Imaging -- CBC    Component Value Date/Time   WBC 13.9* 11/05/2012 2051   RBC 4.95 11/05/2012 2051   HGB 13.6 11/05/2012 2051   HCT 39.2 11/05/2012 2051   PLT 380 11/05/2012 2051   MCV 79.2 11/05/2012 2051   MCH 27.5 11/05/2012 2051   MCHC 34.7 11/05/2012 2051   RDW 15.2 11/05/2012 2051   LYMPHSABS 3.2 11/05/2012 2051   MONOABS 0.7 11/05/2012 2051   EOSABS 0.1 11/05/2012 2051   BASOSABS 0.0 11/05/2012 2051  CMP     Component Value Date/Time   NA 134* 11/05/2012 2051   K 3.4* 11/05/2012 2051   CL 96 11/05/2012 2051   CO2 25 11/05/2012 2051   GLUCOSE 125* 11/05/2012 2051   BUN 10 11/05/2012 2051   CREATININE 0.90 11/05/2012 2051   CALCIUM 9.5 11/05/2012 2051   PROT 8.4* 11/05/2012 2051   ALBUMIN 3.2* 11/05/2012 2051   AST 7 11/05/2012 2051   ALT <5 11/05/2012 2051   ALKPHOS 52  11/05/2012 2051   BILITOT 0.5 11/05/2012 2051   GFRNONAA 78* 11/05/2012 2051   GFRAA >90 11/05/2012 2051   CT ABDOMEN AND PELVIS WITH CONTRAST -- Sept 2013   Technique:  Multidetector CT imaging of the abdomen and pelvis was performed following the standard protocol during bolus administration of intravenous contrast.   Contrast: OMNIPAQUE IOHEXOL 300 MG/ML  SOLN   Comparison: None.   Findings: The lung bases are clear. There is cardiomegaly.  No pleural or pericardial effusion.   There are multiple loops of abnormal small bowel with wall thickening and hyperenhancement identified.  There is some perihepatic, mesenteric and free pelvic fluid.  No focal fluid collection is identified.  No pneumatosis, portal venous gas or free intraperitoneal air is seen.  The celiac axis and superior and inferior mesenteric arteries all opacified normally.  No notable vascular disease is identified.  The colon is largely decompressed but otherwise unremarkable.  The appendix appears normal.  The stomach is nearly completely decompressed but otherwise unremarkable.   The gallbladder, liver, spleen, adrenal glands, pancreas and left kidney appear normal.  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 No lymphadenopathy.  Fibroid uterus is noted.  Urinary bladder is unremarkable.  No focal bony abnormality.   IMPRESSION:   1.  Multiple abnormal loops of small bowel with scattered free abdominal fluid most consistent with infectious or inflammatory enteritis.  No finding to suggest ischemia is identified.  Note is made that the terminal ileum appears normal. 2.  Cardiomegaly. 3.  Fibroid uterus. 4.  Duplicated right renal collecting system is noted.   ABDOMINAL ULTRASOUND COMPLETE -- Feb 2014   Comparison:  CT of the abdomen and pelvis performed 06/13/2012   Findings:   Gallbladder:  The gallbladder is normal in appearance,  without evidence for gallstones, gallbladder wall thickening or pericholecystic fluid.  No ultrasonographic Murphy's sign is elicited.   Common Bile Duct:  0.3 cm in diameter; within normal limits in caliber.   Liver:  Normal parenchymal echogenicity and echotexture; no focal lesions identified.  Limited Doppler evaluation demonstrates normal blood flow within the liver.   IVC:  Unremarkable in appearance.   Pancreas:  Although the pancreas is difficult to visualize in its entirety due to overlying bowel gas, no focal pancreatic abnormality is identified.   Spleen:  8.7 cm in length; within normal limits in size and echotexture.   Right kidney:  13.6 cm in length; normal in size, configuration and parenchymal echogenicity.  No evidence of mass or hydronephrosis.   Left kidney:  12.0 cm in length; normal in size, configuration and parenchymal echogenicity.  No evidence of mass or hydronephrosis.   Abdominal Aorta:  Normal in caliber; no aneurysm identified.   IMPRESSION: Unremarkable abdominal ultrasound.    ASSESSMENT AND PLAN: 42 yo female with PMH of HTN who is seen in consultation at the request of Dr. Roseanne Reno for evaluation of epigastric abdominal pain.  1.  Epigastric abd pain/nausea/dyspepsia -- given the patient's epigastric abdominal pain and dyspeptic symptoms, I recommended EGD to exclude peptic ulcer disease and H. pylori infection. She had some improvement with omeprazole 20 mg, and I'll add a more potent PPI on a daily basis with pantoprazole 40 mg. She is advised to take this 30 minutes to one hour before her first meal of the day. Ultrasound was reviewed and unremarkable, specifically no gallstones or gallbladder wall thickening was seen. The hospitalization in September with bowel wall thickening may have been secondary to infectious gastroenteritis as his symptoms did improve. We will visualize the proximal small bowel in the coming EGD to exclude any ongoing  inflammatory process.  Further recommendations after procedure.

## 2012-11-24 ENCOUNTER — Telehealth: Payer: Self-pay | Admitting: Internal Medicine

## 2012-11-24 NOTE — Telephone Encounter (Signed)
Spoke to pt. 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  She was thankful and said she will pick them up today.

## 2012-12-06 ENCOUNTER — Encounter: Payer: Self-pay | Admitting: Internal Medicine

## 2012-12-06 ENCOUNTER — Ambulatory Visit (AMBULATORY_SURGERY_CENTER): Payer: Self-pay | Admitting: Internal Medicine

## 2012-12-06 VITALS — BP 114/63 | HR 80 | Temp 98.4°F | Resp 22 | Ht 66.0 in | Wt 243.0 lb

## 2012-12-06 DIAGNOSIS — K2990 Gastroduodenitis, unspecified, without bleeding: Secondary | ICD-10-CM

## 2012-12-06 DIAGNOSIS — K2970 Gastritis, unspecified, without bleeding: Secondary | ICD-10-CM

## 2012-12-06 DIAGNOSIS — K219 Gastro-esophageal reflux disease without esophagitis: Secondary | ICD-10-CM

## 2012-12-06 DIAGNOSIS — R1013 Epigastric pain: Secondary | ICD-10-CM

## 2012-12-06 DIAGNOSIS — R112 Nausea with vomiting, unspecified: Secondary | ICD-10-CM

## 2012-12-06 MED ORDER — SODIUM CHLORIDE 0.9 % IV SOLN: 500.0000 mL | INTRAVENOUS | Status: DC

## 2012-12-06 MED ORDER — ESOMEPRAZOLE MAGNESIUM 40 MG PO PACK: 40.0000 mg | PACK | Freq: Every day | ORAL | Status: DC

## 2012-12-06 NOTE — Progress Notes (Signed)
Lidocaine-40mg IV prior to Propofol InductionPropofol given over incremental dosages 

## 2012-12-06 NOTE — Op Note (Signed)
[facility] [address] [place], [address]   ENDOSCOPY PROCEDURE REPORT  PATIENT: Becky, Vazquez  MR#: [id number] BIRTHDATE: Oct 01, 1970 , 41  yrs. old GENDER: Female ENDOSCOPIST: Beverley Fiedler, MD REFERRED BY:  Roseanne Reno, Sami PROCEDURE DATE:  12/06/2012 PROCEDURE:  EGD w/ biopsy for H.pylori ASA CLASS:     Class II INDICATIONS:  Epigastric pain.   Dyspepsia.   Nausea. MEDICATIONS: MAC sedation, administered by CRNA and propofol (Diprivan) 250mg  IV TOPICAL ANESTHETIC: Cetacaine Spray  DESCRIPTION OF PROCEDURE: After the risks benefits and alternatives of the procedure were thoroughly explained, informed consent was obtained.  The LB GIF-H180 [id number] endoscope was introduced through the mouth and advanced to the second portion of the duodenum. Without limitations.  The instrument was slowly withdrawn as the mucosa was fully examined.    ESOPHAGUS: The mucosa of the esophagus appeared normal.   A normal Z-line was observed 37 cm from the incisors.  STOMACH: There was mild striped antral gastropathy noted with a few scattered erosions.  Cold forcep biopsies were taken at the antrum, angularis, and gastric body.  DUODENUM: The duodenal mucosa showed no abnormalities in the bulb and second portion of the duodenum.  Retroflexed views revealed no abnormalities.     The scope was then withdrawn from the patient and the procedure completed.  COMPLICATIONS: There were no complications.  ENDOSCOPIC IMPRESSION: 1.   The mucosa of the esophagus appeared normal 2.   Normal Z-line was observed 37 cm from the incisors 3.   There was mild antral gastropathy noted [T2] 4.   The duodenal mucosa showed no abnormalities in the bulb and second portion of the duodenum  RECOMMENDATIONS: 1.  Await pathology results 2.  Follow-up of helicobacter pylori status, treat if indicated 3.  Continue taking your PPI (pantoprazole) once daily.  It is best to be taken 20-30 minutes  prior to breakfast meal.  eSigned:  Beverley Fiedler, MD 12/06/2012 10:16 AM CC:The Patient

## 2012-12-06 NOTE — Progress Notes (Signed)
Patient did not experience any of the following events: a burn prior to discharge; a fall within the facility; wrong site/side/patient/procedure/implant event; or a hospital transfer or hospital admission upon discharge from the facility. (G8907) Patient did not have preoperative order for IV antibiotic SSI prophylaxis. (G8918)  

## 2012-12-06 NOTE — Patient Instructions (Addendum)
Discharge instructions given with verbal understanding. Biopsies taken. Resume medications. YOU HAD AN ENDOSCOPIC PROCEDURE TODAY AT THE [facility]: Refer to the procedure report that was given to you for any specific questions about what was found during the examination.  If the procedure report does not answer your questions, please call your gastroenterologist to clarify.  If you requested that your care partner not be given the details of your procedure findings, then the procedure report has been included in a sealed envelope for you to review at your convenience later.  YOU SHOULD EXPECT: Some feelings of bloating in the abdomen. Passage of more gas than usual.  Walking can help get rid of the air that was put into your GI tract during the procedure and reduce the bloating. If you had a lower endoscopy (such as a colonoscopy or flexible sigmoidoscopy) you may notice spotting of blood in your stool or on the toilet paper. If you underwent a bowel prep for your procedure, then you may not have a normal bowel movement for a few days.  DIET: Your first meal following the procedure should be a light meal and then it is ok to progress to your normal diet.  A half-sandwich or bowl of soup is an example of a good first meal.  Heavy or fried foods are harder to digest and may make you feel nauseous or bloated.  Likewise meals heavy in dairy and vegetables can cause extra gas to form and this can also increase the bloating.  Drink plenty of fluids but you should avoid alcoholic beverages for 24 hours.  ACTIVITY: Your care partner should take you home directly after the procedure.  You should plan to take it easy, moving slowly for the rest of the day.  You can resume normal activity the day after the procedure however you should NOT DRIVE or use heavy machinery for 24 hours (because of the sedation medicines used during the test).    SYMPTOMS TO REPORT IMMEDIATELY: A gastroenterologist can be  reached at any hour.  During normal business hours, 8:30 AM to 5:00 PM Monday through Friday, call [phone].  After hours and on weekends, please call the GI answering service at [phone] who will take a message and have the physician on call contact you.   Following upper endoscopy (EGD)  Vomiting of blood or coffee ground material  New chest pain or pain under the shoulder blades  Painful or persistently difficult swallowing  New shortness of breath  Fever of 100F or higher  Black, tarry-looking stools  FOLLOW UP: If any biopsies were taken you will be contacted by phone or by letter within the next 1-3 weeks.  Call your gastroenterologist if you have not heard about the biopsies in 3 weeks.  Our staff will call the home number listed on your records the next business day following your procedure to check on you and address any questions or concerns that you may have at that time regarding the information given to you following your procedure. This is a courtesy call and so if there is no answer at the home number and we have not heard from you through the emergency physician on call, we will assume that you have returned to your regular daily activities without incident.  SIGNATURES/CONFIDENTIALITY: You and/or your care partner have signed paperwork which will be entered into your electronic medical record.  These signatures attest to the fact that that the information above on your After Visit  Summary has been reviewed and is understood.  Full responsibility of the confidentiality of this discharge information lies with you and/or your care-partner. 

## 2012-12-06 NOTE — Progress Notes (Signed)
Called to room to assist during endoscopic procedure.  Patient ID and intended procedure confirmed with present staff. Received instructions for my participation in the procedure from the performing physician.  

## 2012-12-07 ENCOUNTER — Telehealth: Payer: Self-pay | Admitting: *Deleted

## 2012-12-07 NOTE — Telephone Encounter (Signed)
  Follow up Call-  Call back number 12/06/2012  Post procedure Call Back phone  # [phone]  Permission to leave phone message Yes     Patient questions:  Do you have a fever, pain , or abdominal swelling? no Pain Score  0 *  Have you tolerated food without any problems? yes  Have you been able to return to your normal activities? yes  Do you have any questions about your discharge instructions: Diet   no Medications  no Follow up visit  no  Do you have questions or concerns about your Care? no  Actions: * If pain score is 4 or above: No action needed, pain <4.

## 2012-12-11 ENCOUNTER — Encounter: Payer: Self-pay | Admitting: Internal Medicine

## 2013-01-02 ENCOUNTER — Telehealth: Payer: Self-pay | Admitting: Internal Medicine

## 2013-01-02 NOTE — Telephone Encounter (Signed)
Spoke to pt. Told her since she does not have insurance to try Prilosec OTC or Omeprazole which can be purchased over the counter. She said she will try that and let me know how it works for her.

## 2013-03-21 ENCOUNTER — Encounter (HOSPITAL_COMMUNITY): Payer: Self-pay | Admitting: Emergency Medicine

## 2013-03-21 ENCOUNTER — Emergency Department (HOSPITAL_COMMUNITY)
Admission: EM | Admit: 2013-03-21 | Discharge: 2013-03-21 | Disposition: A | Discharge status: Home / Self Care | Payer: Self-pay | Attending: Emergency Medicine | Admitting: Emergency Medicine

## 2013-03-21 ENCOUNTER — Emergency Department (HOSPITAL_COMMUNITY): Payer: Self-pay

## 2013-03-21 DIAGNOSIS — I10 Essential (primary) hypertension: Secondary | ICD-10-CM | POA: Insufficient documentation

## 2013-03-21 DIAGNOSIS — Z3202 Encounter for pregnancy test, result negative: Secondary | ICD-10-CM | POA: Insufficient documentation

## 2013-03-21 DIAGNOSIS — R112 Nausea with vomiting, unspecified: Secondary | ICD-10-CM | POA: Insufficient documentation

## 2013-03-21 DIAGNOSIS — Z79899 Other long term (current) drug therapy: Secondary | ICD-10-CM | POA: Insufficient documentation

## 2013-03-21 DIAGNOSIS — R1110 Vomiting, unspecified: Secondary | ICD-10-CM

## 2013-03-21 LAB — URINALYSIS, ROUTINE W REFLEX MICROSCOPIC
Glucose, UA: NEGATIVE mg/dL
Hgb urine dipstick: NEGATIVE
Specific Gravity, Urine: 1.031 — ABNORMAL HIGH (ref 1.005–1.030)
Urobilinogen, UA: 4 mg/dL — ABNORMAL HIGH (ref 0.0–1.0)

## 2013-03-21 LAB — CBC WITH DIFFERENTIAL/PLATELET
Basophils Absolute: 0 10*3/uL (ref 0.0–0.1)
Basophils Relative: 0 % (ref 0–1)
Eosinophils Absolute: 0 10*3/uL (ref 0.0–0.7)
Eosinophils Relative: 0 % (ref 0–5)
Lymphocytes Relative: 13 % (ref 12–46)
MCH: 26.6 pg (ref 26.0–34.0)
MCHC: 33.8 g/dL (ref 30.0–36.0)
MCV: 78.5 fL (ref 78.0–100.0)
Platelets: 361 10*3/uL (ref 150–400)
RDW: 13.6 % (ref 11.5–15.5)
WBC: 12.6 10*3/uL — ABNORMAL HIGH (ref 4.0–10.5)

## 2013-03-21 LAB — POCT PREGNANCY, URINE: Preg Test, Ur: NEGATIVE

## 2013-03-21 LAB — COMPREHENSIVE METABOLIC PANEL
ALT: 25 U/L (ref 0–35)
AST: 46 U/L — ABNORMAL HIGH (ref 0–37)
Calcium: 9.9 mg/dL (ref 8.4–10.5)
Sodium: 137 mEq/L (ref 135–145)
Total Protein: 8.1 g/dL (ref 6.0–8.3)

## 2013-03-21 LAB — URINE MICROSCOPIC-ADD ON

## 2013-03-21 MED ORDER — ONDANSETRON HCL 4 MG/2ML IJ SOLN
4.0000 mg | Freq: Once | INTRAMUSCULAR | Status: AC
  Administered 2013-03-21: 4 mg via INTRAVENOUS
  Filled 2013-03-21: qty 2

## 2013-03-21 MED ORDER — TRAMADOL HCL 50 MG PO TABS: 50.0000 mg | ORAL_TABLET | Freq: Four times a day (QID) | ORAL | Status: DC | PRN

## 2013-03-21 MED ORDER — SODIUM CHLORIDE 0.9 % IV BOLUS (SEPSIS)
1000.0000 mL | Freq: Once | INTRAVENOUS | Status: AC
  Administered 2013-03-21: 1000 mL via INTRAVENOUS

## 2013-03-21 MED ORDER — PANTOPRAZOLE SODIUM 40 MG IV SOLR
40.0000 mg | Freq: Once | INTRAVENOUS | Status: AC
  Administered 2013-03-21: 40 mg via INTRAVENOUS
  Filled 2013-03-21: qty 40

## 2013-03-21 MED ORDER — ONDANSETRON 4 MG PO TBDP: ORAL_TABLET | ORAL | Status: DC

## 2013-03-21 NOTE — ED Provider Notes (Signed)
History    CSN: [id number] Arrival date & time 03/21/13  1119  First MD Initiated Contact with Patient 03/21/13 1132     Chief Complaint  Patient presents with  . Abdominal Pain  . Nausea  . Emesis   (Consider location/radiation/quality/duration/timing/severity/associated sxs/prior Treatment) Patient is a 42 y.o. female presenting with abdominal pain and vomiting. The history is provided by the patient (pt complains of vomiting). No language interpreter was used.  Abdominal Pain This is a new problem. The current episode started more than 2 days ago. The problem occurs constantly. The problem has not changed since onset.Associated symptoms include abdominal pain. Pertinent negatives include no chest pain and no headaches. Nothing aggravates the symptoms. Nothing relieves the symptoms.  Emesis Associated symptoms: abdominal pain   Associated symptoms: no diarrhea and no headaches    Past Medical History  Diagnosis Date  . Hypertension    Past Surgical History  Procedure Laterality Date  . Tubal ligation     Family History  Problem Relation Age of Onset  . Stomach cancer Mother   . Prostate cancer Father   . Breast cancer Maternal Aunt   . Breast cancer Paternal Aunt    History  Substance Use Topics  . Smoking status: Never Smoker   . Smokeless tobacco: Never Used  . Alcohol Use: No   OB History   Grav Para Term Preterm Abortions TAB SAB Ect Mult Living                 Review of Systems  Constitutional: Negative for appetite change and fatigue.  HENT: Negative for congestion, sinus pressure and ear discharge.   Eyes: Negative for discharge.  Respiratory: Negative for cough.   Cardiovascular: Negative for chest pain.  Gastrointestinal: Positive for vomiting and abdominal pain. Negative for diarrhea.  Genitourinary: Negative for frequency and hematuria.  Musculoskeletal: Negative for back pain.  Skin: Negative for rash.  Neurological: Negative for seizures and  headaches.  Psychiatric/Behavioral: Negative for hallucinations.    Allergies  Review of patient's allergies indicates no known allergies.  Home Medications   Current Outpatient Rx  Name  Route  Sig  Dispense  Refill  . lisinopril-hydrochlorothiazide (PRINZIDE,ZESTORETIC) 20-12.5 MG per tablet   Oral   Take 2 tablets by mouth every morning.          . Multiple Vitamin (MULTIVITAMIN) tablet   Oral   Take 1 tablet by mouth daily.         . norethindrone-ethinyl estradiol-iron (LOESTRIN FE 1.5/30) 1.5-30 MG-MCG tablet   Oral   Take 1 tablet by mouth daily.         . phentermine 37.5 MG capsule   Oral   Take 37.5 mg by mouth daily.         . ondansetron (ZOFRAN ODT) 4 MG disintegrating tablet      4mg  ODT q6 hours prn nausea/vomit   12 tablet   0    BP 124/68  Pulse 82  Temp(Src) 98 F (36.7 C) (Oral)  Resp 18  Ht 5\' 5"  (1.651 m)  Wt 229 lb (103.874 kg)  BMI 38.11 kg/m2  SpO2 97%  LMP 02/18/2013 Physical Exam  Constitutional: She is oriented to person, place, and time. She appears well-developed.  HENT:  Head: Normocephalic.  Eyes: Conjunctivae and EOM are normal. No scleral icterus.  Neck: Neck supple. No thyromegaly present.  Cardiovascular: Normal rate and regular rhythm.  Exam reveals no gallop and no friction rub.  No murmur heard. Pulmonary/Chest: No stridor. She has no wheezes. She has no rales. She exhibits no tenderness.  Abdominal: She exhibits no distension. There is no tenderness. There is no rebound.  Musculoskeletal: Normal range of motion. She exhibits no edema.  Lymphadenopathy:    She has no cervical adenopathy.  Neurological: She is oriented to person, place, and time. Coordination normal.  Skin: No rash noted. No erythema.  Psychiatric: She has a normal mood and affect. Her behavior is normal.    ED Course  Procedures (including critical care time) Labs Reviewed  CBC WITH DIFFERENTIAL - Abnormal; Notable for the following:     WBC 12.6 (*)    Hemoglobin 11.5 (*)    HCT 34.0 (*)    Neutrophils Relative % 81 (*)    Neutro Abs 10.2 (*)    All other components within normal limits  COMPREHENSIVE METABOLIC PANEL - Abnormal; Notable for the following:    Potassium 3.1 (*)    Glucose, Bld 122 (*)    AST 46 (*)    GFR calc non Af Amer 73 (*)    GFR calc Af Amer 84 (*)    All other components within normal limits  URINALYSIS, ROUTINE W REFLEX MICROSCOPIC - Abnormal; Notable for the following:    Color, Urine ORANGE (*)    APPearance CLOUDY (*)    Specific Gravity, Urine 1.031 (*)    Bilirubin Urine MODERATE (*)    Ketones, ur 15 (*)    Protein, ur 30 (*)    Urobilinogen, UA 4.0 (*)    Leukocytes, UA TRACE (*)    All other components within normal limits  URINE MICROSCOPIC-ADD ON - Abnormal; Notable for the following:    Squamous Epithelial / LPF FEW (*)    Bacteria, UA FEW (*)    All other components within normal limits  LIPASE, BLOOD  POCT PREGNANCY, URINE   Dg Abd Acute W/chest  03/21/2013   *RADIOLOGY REPORT*  Clinical Data: Right-sided abdominal pain.  Nausea vomiting.  ACUTE ABDOMEN SERIES (ABDOMEN 2 VIEW & CHEST 1 VIEW)  Comparison: 09/08/2012  Findings: The lungs are clear without focal consolidation, edema, effusion or pneumothorax.  Cardiopericardial silhouette is within normal limits for size.  Imaged bony structures of the thorax are intact.  Upright film shows no evidence for intraperitoneal free air. There is no evidence for gaseous bowel dilation to suggest obstruction. Visualized bony structures are unremarkable.  No unexpected abdominopelvic calcification.  IMPRESSION: No acute cardiopulmonary process.  No evidence for bowel obstruction or perforation.   Original Report Authenticated By: Kennith Center, M.D.   1. Vomiting     MDM    Benny Lennert, MD 03/21/13 [phone]

## 2013-03-21 NOTE — ED Notes (Signed)
Pt states that she woke up this morning with RUQ pain.  C/o pain, N/V since.

## 2013-03-21 NOTE — Progress Notes (Signed)
[facility] has seen patient. Patient stated that she had applied for the [facility] but was denied because she made to much money. Pt stated that was awhile ago. Pt stated that she would be interested in trying to apply for the [facility] Card again. Provided her with a oc application.

## 2013-05-02 ENCOUNTER — Ambulatory Visit: Payer: Self-pay | Attending: Family Medicine

## 2013-05-11 ENCOUNTER — Other Ambulatory Visit (HOSPITAL_COMMUNITY)
Admission: RE | Admit: 2013-05-11 | Discharge: 2013-05-11 | Disposition: A | Discharge status: Home / Self Care | Payer: No Typology Code available for payment source | Source: Ambulatory Visit | Attending: Family Medicine | Admitting: Family Medicine

## 2013-05-11 ENCOUNTER — Ambulatory Visit: Payer: No Typology Code available for payment source | Attending: Family Medicine | Admitting: Internal Medicine

## 2013-05-11 VITALS — BP 134/90 | HR 64 | Temp 98.3°F | Ht 63.0 in | Wt 219.6 lb

## 2013-05-11 DIAGNOSIS — N898 Other specified noninflammatory disorders of vagina: Secondary | ICD-10-CM

## 2013-05-11 DIAGNOSIS — N760 Acute vaginitis: Secondary | ICD-10-CM | POA: Insufficient documentation

## 2013-05-11 DIAGNOSIS — Z113 Encounter for screening for infections with a predominantly sexual mode of transmission: Secondary | ICD-10-CM | POA: Insufficient documentation

## 2013-05-11 DIAGNOSIS — R300 Dysuria: Secondary | ICD-10-CM

## 2013-05-11 LAB — POCT URINALYSIS DIPSTICK
Blood, UA: NEGATIVE
Nitrite, UA: NEGATIVE
Spec Grav, UA: 1.025
pH, UA: 5.5

## 2013-05-11 MED ORDER — AZITHROMYCIN 500 MG PO TABS: 1000.0000 mg | ORAL_TABLET | Freq: Once | ORAL | Status: DC

## 2013-05-11 MED ORDER — METRONIDAZOLE 500 MG PO TABS: 2000.0000 mg | ORAL_TABLET | Freq: Once | ORAL | Status: DC

## 2013-05-11 MED ORDER — FLUCONAZOLE 150 MG PO TABS: 150.0000 mg | ORAL_TABLET | Freq: Once | ORAL | Status: DC

## 2013-05-11 MED ORDER — NORETHIN ACE-ETH ESTRAD-FE 1.5-30 MG-MCG PO TABS: 1.0000 | ORAL_TABLET | Freq: Every day | ORAL | Status: DC

## 2013-05-11 NOTE — Progress Notes (Signed)
Patient ID: Becky Vazquez, female   DOB: 1970/12/23, 42 y.o.   MRN: [id number]  CC: To establish care  HPI: Becky Vazquez was seen in the clinic did to establish medical care. She also has a complaint of vaginal discharge with some odor mostly in the mornings. Some itchiness. No blood. Sexually active with one partner. Denies previous history of sexually transmitted disease. She has hypertension on lisinopril-hydrochlorothiazide, well controlled. No history of diabetes Denies abdominal pain, no dyspareunia. She sees a GI doctor for reflux disease and gastritis. She is on Protonix 40 mg twice a day. Significant family history of breast cancer, has had mammogram done in January of this year, normal according to patient. She also had Pap smear done at that time, normal.  No Known Allergies Past Medical History  Diagnosis Date  . Hypertension    Current Outpatient Prescriptions on File Prior to Visit  Medication Sig Dispense Refill  . lisinopril-hydrochlorothiazide (PRINZIDE,ZESTORETIC) 20-12.5 MG per tablet Take 2 tablets by mouth every morning.       . Multiple Vitamin (MULTIVITAMIN) tablet Take 1 tablet by mouth daily.      . phentermine 37.5 MG capsule Take 37.5 mg by mouth daily.       No current facility-administered medications on file prior to visit.   Family History  Problem Relation Age of Onset  . Stomach cancer Mother   . Cancer Mother   . Prostate cancer Father   . Cancer Father   . Breast cancer Maternal Aunt   . Breast cancer Paternal Aunt    History   Social History  . Marital Status: Single    Spouse Name: N/A    Number of Children: 1  . Years of Education: N/A   Occupational History  . childcare provider    Social History Main Topics  . Smoking status: Never Smoker   . Smokeless tobacco: Never Used  . Alcohol Use: No  . Drug Use: No  . Sexual Activity: Not on file   Other Topics Concern  . Not on file   Social History Narrative  . No narrative on file     Review of Systems: Constitutional: Negative for fever, chills, diaphoresis, activity change, appetite change and fatigue. HENT: Negative for ear pain, nosebleeds, congestion, facial swelling, rhinorrhea, neck pain, neck stiffness and ear discharge.  Eyes: Negative for pain, discharge, redness, itching and visual disturbance. Respiratory: Negative for cough, choking, chest tightness, shortness of breath, wheezing and stridor.  Cardiovascular: Negative for chest pain, palpitations and leg swelling. Gastrointestinal: Negative for abdominal distention. Genitourinary: Negative for dysuria, urgency, frequency, hematuria, flank pain, decreased urine volume, difficulty urinating and dyspareunia.  Musculoskeletal: Negative for back pain, joint swelling, arthralgias and gait problem. Neurological: Negative for dizziness, tremors, seizures, syncope, facial asymmetry, speech difficulty, weakness, light-headedness, numbness and headaches.  Hematological: Negative for adenopathy. Does not bruise/bleed easily. Psychiatric/Behavioral: Negative for hallucinations, behavioral problems, confusion, dysphoric mood, decreased concentration and agitation.    Objective:   Filed Vitals:   05/11/13 1208  BP: 134/90  Pulse: 64  Temp: 98.3 F (36.8 C)    Physical Exam: Constitutional: Patient appears well-developed and well-nourished. No distress. HENT: Normocephalic, atraumatic, External right and left ear normal. Oropharynx is clear and moist.  Eyes: Conjunctivae and EOM are normal. PERRLA, no scleral icterus. Neck: Normal ROM. Neck supple. No JVD. No tracheal deviation. No thyromegaly. CVS: RRR, S1/S2 +, no murmurs, no gallops, no carotid bruit.  Pulmonary: Effort and breath sounds normal, no stridor, rhonchi,  wheezes, rales.  Abdominal: Soft. BS +,  no distension, tenderness, rebound or guarding.  Musculoskeletal: Normal range of motion. No edema and no tenderness.  Lymphadenopathy: No lymphadenopathy  noted, cervical, inguinal or axillary Neuro: Alert. Normal reflexes, muscle tone coordination. No cranial nerve deficit. Skin: Skin is warm and dry. No rash noted. Not diaphoretic. No erythema. No pallor. Psychiatric: Normal mood and affect. Behavior, judgment, thought content normal.  Lab Results  Component Value Date   WBC 12.6* 03/21/2013   HGB 11.5* 03/21/2013   HCT 34.0* 03/21/2013   MCV 78.5 03/21/2013   PLT 361 03/21/2013   Lab Results  Component Value Date   CREATININE 0.95 03/21/2013   BUN 9 03/21/2013   NA 137 03/21/2013   K 3.1* 03/21/2013   CL 98 03/21/2013   CO2 29 03/21/2013    Lab Results  Component Value Date   HGBA1C 6.3 01/24/2009   Lipid Panel     Component Value Date/Time   CHOL 171 01/08/2008 1735   TRIG 71 01/08/2008 1735   HDL 51 01/08/2008 1735   CHOLHDL 3.4 Ratio 01/08/2008 1735   VLDL 14 01/08/2008 1735   LDLCALC 106* 01/08/2008 1735       Assessment and plan:   Patient Active Problem List   Diagnosis Date Noted  . Vaginal discharge 05/11/2013  . Dysuria 05/11/2013  . Abdominal pain, acute 06/14/2012  . Enteritis 06/14/2012  . Dehydration 06/14/2012  . Nausea & vomiting 06/14/2012  . Constipation 06/14/2012  . FATIGUE 03/03/2010  . VAGINITIS 10/16/2009  . IMPAIRED GLUCOSE TOLERANCE 01/09/2008  . ANEMIA, IRON DEFICIENCY 01/09/2008  . LEIOMYOMA, UTERUS 02/07/2007  . HYPERTENSION, BENIGN 12/28/2006  . OBESITY, NOS 11/24/2006   Pelvic exam was done today Foul-smelling discharge found No cervical motion tenderness  Swab taken for culture, GC/Chlamydia and wet mount  Zithromax 1 g tablet by mouth once Metronidazole 2 g tablet by mouth once Diflucan 150 mg tablet by mouth once Refilled birth control pills Patient counseled  Becky Vazquez was given clear instructions to go to ER or return to the clinic if symptoms don't improve, worsen or new problems develop.  Becky Vazquez verbalized understanding.  Becky Vazquez was told to call to get  lab results if hasn't heard anything in the next week.        Jeanann Lewandowsky, MD [facility] [place], [place] [phone]   05/11/2013, 12:43 PM

## 2013-05-12 ENCOUNTER — Emergency Department (HOSPITAL_COMMUNITY)
Admission: EM | Admit: 2013-05-12 | Discharge: 2013-05-12 | Disposition: A | Discharge status: Home / Self Care | Payer: No Typology Code available for payment source | Attending: Emergency Medicine | Admitting: Emergency Medicine

## 2013-05-12 ENCOUNTER — Encounter (HOSPITAL_COMMUNITY): Payer: Self-pay | Admitting: Emergency Medicine

## 2013-05-12 DIAGNOSIS — Z3202 Encounter for pregnancy test, result negative: Secondary | ICD-10-CM | POA: Insufficient documentation

## 2013-05-12 DIAGNOSIS — Z79899 Other long term (current) drug therapy: Secondary | ICD-10-CM | POA: Insufficient documentation

## 2013-05-12 DIAGNOSIS — Z9851 Tubal ligation status: Secondary | ICD-10-CM | POA: Insufficient documentation

## 2013-05-12 DIAGNOSIS — I10 Essential (primary) hypertension: Secondary | ICD-10-CM | POA: Insufficient documentation

## 2013-05-12 DIAGNOSIS — R109 Unspecified abdominal pain: Secondary | ICD-10-CM

## 2013-05-12 DIAGNOSIS — R1011 Right upper quadrant pain: Secondary | ICD-10-CM | POA: Insufficient documentation

## 2013-05-12 LAB — COMPREHENSIVE METABOLIC PANEL
Albumin: 3.4 g/dL — ABNORMAL LOW (ref 3.5–5.2)
Alkaline Phosphatase: 63 U/L (ref 39–117)
BUN: 15 mg/dL (ref 6–23)
Calcium: 9.6 mg/dL (ref 8.4–10.5)
Creatinine, Ser: 0.92 mg/dL (ref 0.50–1.10)
GFR calc Af Amer: 88 mL/min — ABNORMAL LOW (ref >90)
Glucose, Bld: 165 mg/dL — ABNORMAL HIGH (ref 70–99)
Potassium: 3.2 mEq/L — ABNORMAL LOW (ref 3.5–5.1)
Total Protein: 7.9 g/dL (ref 6.0–8.3)

## 2013-05-12 LAB — URINALYSIS, ROUTINE W REFLEX MICROSCOPIC
Bilirubin Urine: NEGATIVE
Leukocytes, UA: NEGATIVE
Nitrite: NEGATIVE
Specific Gravity, Urine: 1.031 — ABNORMAL HIGH (ref 1.005–1.030)
Urobilinogen, UA: 1 mg/dL (ref 0.0–1.0)
pH: 5.5 (ref 5.0–8.0)

## 2013-05-12 LAB — CBC WITH DIFFERENTIAL/PLATELET
Basophils Relative: 0 % (ref 0–1)
Eosinophils Absolute: 0.1 10*3/uL (ref 0.0–0.7)
Eosinophils Relative: 1 % (ref 0–5)
Hemoglobin: 11.5 g/dL — ABNORMAL LOW (ref 12.0–15.0)
Lymphs Abs: 2.2 10*3/uL (ref 0.7–4.0)
MCH: 27.5 pg (ref 26.0–34.0)
MCHC: 34.1 g/dL (ref 30.0–36.0)
MCV: 80.6 fL (ref 78.0–100.0)
Monocytes Absolute: 0.6 10*3/uL (ref 0.1–1.0)
Monocytes Relative: 5 % (ref 3–12)
Neutrophils Relative %: 76 % (ref 43–77)
RBC: 4.18 MIL/uL (ref 3.87–5.11)

## 2013-05-12 LAB — LIPASE, BLOOD: Lipase: 44 U/L (ref 11–59)

## 2013-05-12 MED ORDER — ONDANSETRON 4 MG PO TBDP
4.0000 mg | ORAL_TABLET | Freq: Once | ORAL | Status: AC
  Administered 2013-05-12: 4 mg via ORAL
  Filled 2013-05-12: qty 1

## 2013-05-12 MED ORDER — POTASSIUM CHLORIDE CRYS ER 20 MEQ PO TBCR
40.0000 meq | EXTENDED_RELEASE_TABLET | Freq: Once | ORAL | Status: AC
  Administered 2013-05-12: 40 meq via ORAL
  Filled 2013-05-12: qty 2

## 2013-05-12 NOTE — ED Provider Notes (Signed)
Medical screening examination/treatment/procedure(s) were performed by non-physician practitioner and as supervising physician I was immediately available for consultation/collaboration.  Olivia Mackie, MD 05/12/13 [phone]

## 2013-05-12 NOTE — ED Provider Notes (Signed)
CSN: [id number]     Arrival date & time 05/12/13  0231 History     First MD Initiated Contact with Patient 05/12/13 [phone]     Chief Complaint  Patient presents with  . Abdominal Pain   (Consider location/radiation/quality/duration/timing/severity/associated sxs/prior Treatment) Patient is a 42 y.o. female presenting with abdominal pain. The history is provided by the patient. No language interpreter was used.  Abdominal Pain Pain location:  RUQ Pain quality: aching   Pain radiates to:  R flank Pain severity:  Moderate Associated symptoms: no chills, no dysuria, no fever, no hematuria and no vaginal discharge   Associated symptoms comment:  Pain that started around 1:00 a.m. She had nausea with vomiting. No recent fever, no diarrhea. Pain has subsided since onset and currently she complains only of pain in the flank area. She denies having similar symptoms in the past. Her last meal was approximately 4 hours prior to onset of pain and consisted of chicken wings. She reports being seen in the clinic yesterday for symptoms of vaginal itching. She currently denies dysuria, vaginal discharge or pelvic pain.   Past Medical History  Diagnosis Date  . Hypertension    Past Surgical History  Procedure Laterality Date  . Tubal ligation     Family History  Problem Relation Age of Onset  . Stomach cancer Mother   . Cancer Mother   . Prostate cancer Father   . Cancer Father   . Breast cancer Maternal Aunt   . Breast cancer Paternal Aunt    History  Substance Use Topics  . Smoking status: Never Smoker   . Smokeless tobacco: Never Used  . Alcohol Use: No   OB History   Grav Para Term Preterm Abortions TAB SAB Ect Mult Living                 Review of Systems  Constitutional: Negative for fever and chills.  Respiratory: Negative.   Cardiovascular: Negative.   Gastrointestinal: Positive for abdominal pain.  Genitourinary: Positive for flank pain. Negative for dysuria, hematuria and  vaginal discharge.  Musculoskeletal: Negative.  Negative for myalgias.  Neurological: Negative.     Allergies  Review of patient's allergies indicates no known allergies.  Home Medications   Current Outpatient Rx  Name  Route  Sig  Dispense  Refill  . lisinopril-hydrochlorothiazide (PRINZIDE,ZESTORETIC) 20-12.5 MG per tablet   Oral   Take 2 tablets by mouth every morning.          . Multiple Vitamin (MULTIVITAMIN) tablet   Oral   Take 1 tablet by mouth daily.         . norethindrone-ethinyl estradiol-iron (LOESTRIN FE 1.5/30) 1.5-30 MG-MCG tablet   Oral   Take 1 tablet by mouth daily.   1 Package   2   . phentermine 37.5 MG capsule   Oral   Take 37.5 mg by mouth every morning.          Marland Kitchen azithromycin (ZITHROMAX) 500 MG tablet   Oral   Take 2 tablets (1,000 mg total) by mouth once.   6 tablet   0   . fluconazole (DIFLUCAN) 150 MG tablet   Oral   Take 1 tablet (150 mg total) by mouth once.   1 tablet   0   . metroNIDAZOLE (FLAGYL) 500 MG tablet   Oral   Take 4 tablets (2,000 mg total) by mouth once.   21 tablet   0    BP 118/74  Pulse 78  Temp(Src) 98.3 F (36.8 C) (Oral)  Resp 18  Ht 5\' 2"  (1.575 m)  Wt 219 lb (99.338 kg)  BMI 40.05 kg/m2  SpO2 100%  LMP 04/28/2013 Physical Exam  Constitutional: She is oriented to person, place, and time. She appears well-developed and well-nourished.  HENT:  Head: Normocephalic.  Neck: Normal range of motion. Neck supple.  Cardiovascular: Normal rate and regular rhythm.   Pulmonary/Chest: Effort normal and breath sounds normal.  Abdominal: Soft. Bowel sounds are normal. There is no tenderness. There is no rebound and no guarding.  Obese, soft abdomen is completely non-tender.  Genitourinary:  Minimal flank tenderness.   Musculoskeletal: Normal range of motion.  Neurological: She is alert and oriented to person, place, and time.  Skin: Skin is warm and dry. No rash noted.  Psychiatric: She has a normal  mood and affect.    ED Course   Procedures (including critical care time)  Labs Reviewed  CBC WITH DIFFERENTIAL - Abnormal; Notable for the following:    WBC 12.1 (*)    Hemoglobin 11.5 (*)    HCT 33.7 (*)    Neutro Abs 9.2 (*)    All other components within normal limits  COMPREHENSIVE METABOLIC PANEL - Abnormal; Notable for the following:    Sodium 134 (*)    Potassium 3.2 (*)    Chloride 95 (*)    Glucose, Bld 165 (*)    Albumin 3.4 (*)    Total Bilirubin 0.2 (*)    GFR calc non Af Amer 76 (*)    GFR calc Af Amer 88 (*)    All other components within normal limits  URINALYSIS, ROUTINE W REFLEX MICROSCOPIC - Abnormal; Notable for the following:    APPearance CLOUDY (*)    Specific Gravity, Urine 1.031 (*)    All other components within normal limits  LIPASE, BLOOD  PREGNANCY, URINE   Results for orders placed during the hospital encounter of 05/12/13  CBC WITH DIFFERENTIAL      Result Value Range   WBC 12.1 (*) 4.0 - 10.5 K/uL   RBC 4.18  3.87 - 5.11 MIL/uL   Hemoglobin 11.5 (*) 12.0 - 15.0 g/dL   HCT 16.1 (*) 09.6 - 04.5 %   MCV 80.6  78.0 - 100.0 fL   MCH 27.5  26.0 - 34.0 pg   MCHC 34.1  30.0 - 36.0 g/dL   RDW 40.9  81.1 - 91.4 %   Platelets 329  150 - 400 K/uL   Neutrophils Relative % 76  43 - 77 %   Neutro Abs 9.2 (*) 1.7 - 7.7 K/uL   Lymphocytes Relative 18  12 - 46 %   Lymphs Abs 2.2  0.7 - 4.0 K/uL   Monocytes Relative 5  3 - 12 %   Monocytes Absolute 0.6  0.1 - 1.0 K/uL   Eosinophils Relative 1  0 - 5 %   Eosinophils Absolute 0.1  0.0 - 0.7 K/uL   Basophils Relative 0  0 - 1 %   Basophils Absolute 0.0  0.0 - 0.1 K/uL  COMPREHENSIVE METABOLIC PANEL      Result Value Range   Sodium 134 (*) 135 - 145 mEq/L   Potassium 3.2 (*) 3.5 - 5.1 mEq/L   Chloride 95 (*) 96 - 112 mEq/L   CO2 28  19 - 32 mEq/L   Glucose, Bld 165 (*) 70 - 99 mg/dL   BUN 15  6 - 23 mg/dL   Creatinine, Ser  0.92  0.50 - 1.10 mg/dL   Calcium 9.6  8.4 - 16.1 mg/dL   Total Protein  7.9  6.0 - 8.3 g/dL   Albumin 3.4 (*) 3.5 - 5.2 g/dL   AST 18  0 - 37 U/L   ALT 11  0 - 35 U/L   Alkaline Phosphatase 63  39 - 117 U/L   Total Bilirubin 0.2 (*) 0.3 - 1.2 mg/dL   GFR calc non Af Amer 76 (*) >90 mL/min   GFR calc Af Amer 88 (*) >90 mL/min  LIPASE, BLOOD      Result Value Range   Lipase 44  11 - 59 U/L  URINALYSIS, ROUTINE W REFLEX MICROSCOPIC      Result Value Range   Color, Urine YELLOW  YELLOW   APPearance CLOUDY (*) CLEAR   Specific Gravity, Urine 1.031 (*) 1.005 - 1.030   pH 5.5  5.0 - 8.0   Glucose, UA NEGATIVE  NEGATIVE mg/dL   Hgb urine dipstick NEGATIVE  NEGATIVE   Bilirubin Urine NEGATIVE  NEGATIVE   Ketones, ur NEGATIVE  NEGATIVE mg/dL   Protein, ur NEGATIVE  NEGATIVE mg/dL   Urobilinogen, UA 1.0  0.0 - 1.0 mg/dL   Nitrite NEGATIVE  NEGATIVE   Leukocytes, UA NEGATIVE  NEGATIVE  PREGNANCY, URINE      Result Value Range   Preg Test, Ur NEGATIVE  NEGATIVE    No results found. No diagnosis found. 1. Abdominal pain  MDM  Records reviewed. Patient had a negative abdominal US in 2/14. She is benign in appearance now with a non-tender abdomen and essentially negative lab studies (mild hypokalemia which was supplemented here). Do not feel need to repeat US to r/o gall bladder. Stable for discharge and encouraged PCP follow up.  Arnoldo Hooker, PA-C 05/12/13 [phone]

## 2013-05-12 NOTE — ED Notes (Signed)
Pt c/o RUQ pain radiating around to back onset 0100 with n/v.

## 2013-05-25 ENCOUNTER — Telehealth: Payer: Self-pay | Admitting: Emergency Medicine

## 2013-05-25 NOTE — Telephone Encounter (Signed)
Pt made aware of Culture results. States she did take prescribed ATB's

## 2013-05-25 NOTE — Telephone Encounter (Signed)
Message copied by Darlis Loan on Fri May 25, 2013  4:21 PM ------      Message from: Jeanann Lewandowsky E      Created: Wed May 23, 2013 11:50 AM       Please call patient to inform her about her vagina swab test is positive for infection. The antibiotics we gave her should cover these type of infection, it is not sexually transmitted her partner does not need to be treated ------

## 2013-05-29 ENCOUNTER — Encounter: Payer: Self-pay | Admitting: Internal Medicine

## 2013-05-29 ENCOUNTER — Ambulatory Visit (INDEPENDENT_AMBULATORY_CARE_PROVIDER_SITE_OTHER): Payer: No Typology Code available for payment source | Admitting: Internal Medicine

## 2013-05-29 VITALS — BP 106/68 | HR 72 | Ht 64.0 in | Wt 218.0 lb

## 2013-05-29 DIAGNOSIS — R112 Nausea with vomiting, unspecified: Secondary | ICD-10-CM

## 2013-05-29 DIAGNOSIS — R1011 Right upper quadrant pain: Secondary | ICD-10-CM

## 2013-05-29 DIAGNOSIS — K219 Gastro-esophageal reflux disease without esophagitis: Secondary | ICD-10-CM

## 2013-05-29 MED ORDER — ONDANSETRON HCL 4 MG PO TABS
4.0000 mg | ORAL_TABLET | Freq: Three times a day (TID) | ORAL | Status: DC | PRN
Start: 2013-05-29 — End: 2013-11-08

## 2013-05-29 MED ORDER — HYDROCODONE-ACETAMINOPHEN 5-325 MG PO TABS: 1.0000 | ORAL_TABLET | Freq: Four times a day (QID) | ORAL | Status: DC | PRN

## 2013-05-29 NOTE — Patient Instructions (Addendum)
We have sent the following medications to your pharmacy for you to pick up at your convenience: Vicodin and Zofran; take all meds as prescribed  You have been scheduled for a HIDA scan at [facility] (1st floor) on 06/06/2013. Please arrive 15 minutes prior to your scheduled appointment on 1:47WG. Make certain not to have anything to eat or drink at least 6 hours prior to your test. Should this appointment date or time not work well for you, please call radiology scheduling at [phone].  _____________________________________________________________________ hepatobiliary (HIDA) scan is an imaging procedure used to diagnose problems in the liver, gallbladder and bile ducts. In the HIDA scan, a radioactive chemical or tracer is injected into a vein in your arm. The tracer is handled by the liver like bile. Bile is a fluid produced and excreted by your liver that helps your digestive system break down fats in the foods you eat. Bile is stored in your gallbladder and the gallbladder releases the bile when you eat a meal. A special nuclear medicine scanner (gamma camera) tracks the flow of the tracer from your liver into your gallbladder and small intestine.  During your HIDA scan  You'll be asked to change into a hospital gown before your HIDA scan begins. Your health care team will position you on a table, usually on your back. The radioactive tracer is then injected into a vein in your arm.The tracer travels through your bloodstream to your liver, where it's taken up by the bile-producing cells. The radioactive tracer travels with the bile from your liver into your gallbladder and through your bile ducts to your small intestine.You may feel some pressure while the radioactive tracer is injected into your vein. As you lie on the table, a special gamma camera is positioned over your abdomen taking pictures of the tracer as it moves through your body. The gamma camera takes pictures continually for about an hour.  You'll need to keep still during the HIDA scan. This can become uncomfortable, but you may find that you can lessen the discomfort by taking deep breaths and thinking about other things. Tell your health care team if you're uncomfortable. The radiologist will watch on a computer the progress of the radioactive tracer through your body. The HIDA scan may be stopped when the radioactive tracer is seen in the gallbladder and enters your small intestine. This typically takes about an hour. In some cases extra imaging will be performed if original images aren't satisfactory, if morphine is given to help visualize the gallbladder or if the medication CCK is given to look at the contraction of the gallbladder. This test typically takes 2 hours to complete. ________________________________________________________________________    ________________________________________________________________________

## 2013-05-29 NOTE — Progress Notes (Signed)
Subjective:    Patient ID: Becky Vazquez, female    DOB: 1970-12-25, 42 y.o.   MRN: [id number]  HPI Becky Vazquez is a 42 yo female with PMH of HTN who is seen in followup for episodic abdominal pain with nausea and vomiting. She was last seen in February 2014 in the office for evaluation of abdominal pain. She came for upper endoscopy on 12/06/2012 which revealed mild antral gastritis but was otherwise unremarkable. Biopsy showed mild chronic gastritis without H. pylori, metaplasia or dysplasia. She was changed to a more potent PPI, and initially felt like she was doing better. She reports developing recurrent episodic intense right upper quadrant abdominal pain over the last several months. She calls this happening twice in June, and then again in late August 2014. She reports the last 2 episodes have started in the middle of the night waking her from sleep. The pain is located in the right mid back, and eventually moved to the right upper quadrant and epigastrium. This pain radiates to her right shoulder. It is associated with nausea and vomiting. At times it can be associated with diarrhea. No blood in her stool or melena. The episodes last 2-4 hours. She tries to get in certain positions to help the pain go away but this is often impossible. She has been in the ER twice with these episodes. She has an antinausea medication at home but often is unhelpful. She is taking Nexium 20 mg daily over-the-counter. She denies heartburn, dysphagia or odynophagia. She has tried to change her diet by eating a lower fat diet which she feels is also help her heartburn.  Review of Systems As per history of present illness, otherwise negative  Current Medications, Allergies, Past Medical History, Past Surgical History, Family History and Social History were reviewed in [employer] record.     Objective:   Physical Exam BP 106/68  Pulse 72  Ht 5\' 4"  (1.626 m)  Wt 218 lb (98.884 kg)  BMI  37.4 kg/m2  LMP 04/28/2013 Constitutional: Well-developed and well-nourished. No distress. HEENT: Normocephalic and atraumatic. Oropharynx is clear and moist. No oropharyngeal exudate. Conjunctivae are normal.  No scleral icterus. Neck: Neck supple. Trachea midline. Cardiovascular: Normal rate, regular rhythm and intact distal pulses. No M/R/G Pulmonary/chest: Effort normal and breath sounds normal. No wheezing, rales or rhonchi. Abdominal: Soft,obese, nontender, nondistended. Bowel sounds active throughout. There are no masses palpable. Extremities: no clubbing, cyanosis, or edema Neurological: Alert and oriented to person place and time. Skin: Skin is warm and dry. No rashes noted. Psychiatric: Normal mood and affect. Behavior is normal.  CMP     Component Value Date/Time   NA 134* 05/12/2013 0340   K 3.2* 05/12/2013 0340   CL 95* 05/12/2013 0340   CO2 28 05/12/2013 0340   GLUCOSE 165* 05/12/2013 0340   BUN 15 05/12/2013 0340   CREATININE 0.92 05/12/2013 0340   CALCIUM 9.6 05/12/2013 0340   PROT 7.9 05/12/2013 0340   ALBUMIN 3.4* 05/12/2013 0340   AST 18 05/12/2013 0340   ALT 11 05/12/2013 0340   ALKPHOS 63 05/12/2013 0340   BILITOT 0.2* 05/12/2013 0340   GFRNONAA 76* 05/12/2013 0340   GFRAA 88* 05/12/2013 0340    CBC    Component Value Date/Time   WBC 12.1* 05/12/2013 0340   RBC 4.18 05/12/2013 0340   HGB 11.5* 05/12/2013 0340   HCT 33.7* 05/12/2013 0340   PLT 329 05/12/2013 0340   MCV 80.6 05/12/2013 0340   MCH  27.5 05/12/2013 0340   MCHC 34.1 05/12/2013 0340   RDW 13.5 05/12/2013 0340   LYMPHSABS 2.2 05/12/2013 0340   MONOABS 0.6 05/12/2013 0340   EOSABS 0.1 05/12/2013 0340   BASOSABS 0.0 05/12/2013 0340   ABDOMINAL ULTRASOUND COMPLETE -- Feb 2014   Comparison:  CT of the abdomen and pelvis performed 06/13/2012   Findings:   Gallbladder:  The gallbladder is normal in appearance, without evidence for gallstones, gallbladder wall thickening or pericholecystic fluid.  No  ultrasonographic Murphy's sign is elicited.   Common Bile Duct:  0.3 cm in diameter; within normal limits in caliber.   Liver:  Normal parenchymal echogenicity and echotexture; no focal lesions identified.  Limited Doppler evaluation demonstrates normal blood flow within the liver.   IVC:  Unremarkable in appearance.   Pancreas:  Although the pancreas is difficult to visualize in its entirety due to overlying bowel gas, no focal pancreatic abnormality is identified.   Spleen:  8.7 cm in length; within normal limits in size and echotexture.   Right kidney:  13.6 cm in length; normal in size, configuration and parenchymal echogenicity.  No evidence of mass or hydronephrosis.   Left kidney:  12.0 cm in length; normal in size, configuration and parenchymal echogenicity.  No evidence of mass or hydronephrosis.   Abdominal Aorta:  Normal in caliber; no aneurysm identified.   IMPRESSION: Unremarkable abdominal ultrasound.      Assessment & Plan:  42 yo female with PMH of HTN who is seen in followup for episodic abdominal pain with nausea and vomiting.   1.  Episodic RUQ/epigastric pain -- her pain episodes sound very classic for biliary colic, given that they, are located in the right upper quadrant and right mid back radiating to the shoulder, and are associated with nausea and vomiting. Upper endoscopy previous performed showed mild gastritis but no ulcer disease or H. pylori, or any other pathology to explain such dramatic/intense symptoms.  I have recommended a CCK-HIDA scan to evaluate for biliary dyskinesia. Previous ultrasound performed in February of this year was negative for stones. I will give her a prescription for Vicodin to be used as needed and as directed for this episodic pain along with sublingual Zofran. She may have to go to the ER if the painful episodes do not remit at home.  Further recommendation after imaging   2.  GERD/history of gastritis  -- H. pylori  negative. She can continue over-the-counter Nexium 20 mg daily as it is controlling her heartburn adequately at this time

## 2013-06-06 ENCOUNTER — Encounter (HOSPITAL_COMMUNITY)
Admission: RE | Admit: 2013-06-06 | Discharge: 2013-06-06 | Disposition: A | Discharge status: Home / Self Care | Payer: No Typology Code available for payment source | Source: Ambulatory Visit | Attending: Internal Medicine | Admitting: Internal Medicine

## 2013-06-06 ENCOUNTER — Other Ambulatory Visit: Payer: Self-pay | Admitting: Internal Medicine

## 2013-06-06 DIAGNOSIS — R112 Nausea with vomiting, unspecified: Secondary | ICD-10-CM | POA: Insufficient documentation

## 2013-06-06 DIAGNOSIS — R1011 Right upper quadrant pain: Secondary | ICD-10-CM | POA: Insufficient documentation

## 2013-06-06 MED ORDER — TECHNETIUM TC 99M MEBROFENIN IV KIT
5.1000 | PACK | Freq: Once | INTRAVENOUS | Status: AC | PRN
  Administered 2013-06-06: 5 via INTRAVENOUS

## 2013-06-10 ENCOUNTER — Encounter (HOSPITAL_COMMUNITY): Payer: Self-pay

## 2013-06-10 ENCOUNTER — Emergency Department (HOSPITAL_COMMUNITY)
Admission: EM | Admit: 2013-06-10 | Discharge: 2013-06-10 | Disposition: A | Discharge status: Home / Self Care | Payer: No Typology Code available for payment source | Attending: Emergency Medicine | Admitting: Emergency Medicine

## 2013-06-10 DIAGNOSIS — I10 Essential (primary) hypertension: Secondary | ICD-10-CM | POA: Insufficient documentation

## 2013-06-10 DIAGNOSIS — Z79899 Other long term (current) drug therapy: Secondary | ICD-10-CM | POA: Insufficient documentation

## 2013-06-10 DIAGNOSIS — R1110 Vomiting, unspecified: Secondary | ICD-10-CM

## 2013-06-10 DIAGNOSIS — R112 Nausea with vomiting, unspecified: Secondary | ICD-10-CM | POA: Insufficient documentation

## 2013-06-10 DIAGNOSIS — R1011 Right upper quadrant pain: Secondary | ICD-10-CM | POA: Insufficient documentation

## 2013-06-10 DIAGNOSIS — Z9851 Tubal ligation status: Secondary | ICD-10-CM | POA: Insufficient documentation

## 2013-06-10 DIAGNOSIS — Z3202 Encounter for pregnancy test, result negative: Secondary | ICD-10-CM | POA: Insufficient documentation

## 2013-06-10 DIAGNOSIS — R109 Unspecified abdominal pain: Secondary | ICD-10-CM

## 2013-06-10 LAB — URINALYSIS, ROUTINE W REFLEX MICROSCOPIC
Glucose, UA: NEGATIVE mg/dL
Ketones, ur: NEGATIVE mg/dL
Leukocytes, UA: NEGATIVE
Nitrite: NEGATIVE
Protein, ur: NEGATIVE mg/dL

## 2013-06-10 LAB — CBC WITH DIFFERENTIAL/PLATELET
Eosinophils Absolute: 0.2 10*3/uL (ref 0.0–0.7)
Eosinophils Relative: 2 % (ref 0–5)
Hemoglobin: 11.3 g/dL — ABNORMAL LOW (ref 12.0–15.0)
Lymphs Abs: 3.1 10*3/uL (ref 0.7–4.0)
MCH: 27.8 pg (ref 26.0–34.0)
MCV: 80 fL (ref 78.0–100.0)
Monocytes Relative: 6 % (ref 3–12)
RBC: 4.06 MIL/uL (ref 3.87–5.11)

## 2013-06-10 LAB — COMPREHENSIVE METABOLIC PANEL
Albumin: 3 g/dL — ABNORMAL LOW (ref 3.5–5.2)
Alkaline Phosphatase: 48 U/L (ref 39–117)
BUN: 9 mg/dL (ref 6–23)
Chloride: 98 mEq/L (ref 96–112)
Creatinine, Ser: 0.82 mg/dL (ref 0.50–1.10)
GFR calc Af Amer: >90 mL/min (ref >90)
Glucose, Bld: 138 mg/dL — ABNORMAL HIGH (ref 70–99)
Total Bilirubin: 0.2 mg/dL — ABNORMAL LOW (ref 0.3–1.2)
Total Protein: 7.1 g/dL (ref 6.0–8.3)

## 2013-06-10 LAB — POCT PREGNANCY, URINE: Preg Test, Ur: NEGATIVE

## 2013-06-10 MED ORDER — PROMETHAZINE HCL 25 MG/ML IJ SOLN
12.5000 mg | Freq: Once | INTRAMUSCULAR | Status: AC
  Administered 2013-06-10: 12.5 mg via INTRAVENOUS
  Filled 2013-06-10 (×2): qty 1

## 2013-06-10 MED ORDER — OXYCODONE-ACETAMINOPHEN 5-325 MG PO TABS: 1.0000 | ORAL_TABLET | Freq: Four times a day (QID) | ORAL | Status: DC | PRN

## 2013-06-10 MED ORDER — POTASSIUM CHLORIDE 20 MEQ/15ML (10%) PO LIQD: 40.0000 meq | Freq: Once | ORAL | Status: DC

## 2013-06-10 MED ORDER — ONDANSETRON HCL 4 MG/2ML IJ SOLN
4.0000 mg | Freq: Once | INTRAMUSCULAR | Status: AC
  Administered 2013-06-10: 4 mg via INTRAVENOUS
  Filled 2013-06-10: qty 2

## 2013-06-10 MED ORDER — POTASSIUM CHLORIDE CRYS ER 20 MEQ PO TBCR
40.0000 meq | EXTENDED_RELEASE_TABLET | Freq: Once | ORAL | Status: AC
  Administered 2013-06-10: 40 meq via ORAL
  Filled 2013-06-10: qty 2

## 2013-06-10 MED ORDER — MORPHINE SULFATE 4 MG/ML IJ SOLN
4.0000 mg | Freq: Once | INTRAMUSCULAR | Status: AC
  Administered 2013-06-10: 4 mg via INTRAVENOUS
  Filled 2013-06-10: qty 1

## 2013-06-10 NOTE — ED Notes (Signed)
Patient c/o right flank that radiates to the mid abdomen. Patient c/o vomiting. Patient has been taking medication for pain(Hydrocodone) and vomiting(Zofran). Patient last took the hydrocodone at 0830 this AM and the zofran at 0300. Patiaent denies any diarrhea or fever

## 2013-06-10 NOTE — ED Provider Notes (Signed)
CSN: [id number]     Arrival date & time 06/10/13  0907 History   First MD Initiated Contact with Patient 06/10/13 1001     Chief Complaint  Patient presents with  . Flank Pain  . Emesis   (Consider location/radiation/quality/duration/timing/severity/associated sxs/prior Treatment) HPI This is a 42 year old female with history of abdominal pain and vomiting who presents with the same. Patient reports right-sided flank pain and right upper quadrant pain. She states the pain comes and goes. She reports symptoms since this past January. She has had multiple evaluations in the emergency room and at her primary care office. Patient states that for the last 3 weeks she has had worsening of this pain. It is worse with eating. She endorses nonbilious, nonbloody emesis. She denies any urinary symptoms or fevers. Currently she is pain-free. She took hydrocodone before she left.  Reviewed the patient's chart reveals multiple presentations for the same. She had an endoscopy that showed gastritis earlier this year. She was sent for a HIDA scan which she states that she had this past Wednesday. She states they're unable to complete it because they could not see the gallbladder. The patient also had an ultrasound in February that did not show gallstones.   Past Medical History  Diagnosis Date  . Hypertension    Past Surgical History  Procedure Laterality Date  . Tubal ligation     Family History  Problem Relation Age of Onset  . Stomach cancer Mother   . Cancer Mother   . Prostate cancer Father   . Cancer Father   . Breast cancer Maternal Aunt   . Breast cancer Paternal Aunt    History  Substance Use Topics  . Smoking status: Never Smoker   . Smokeless tobacco: Never Used  . Alcohol Use: No   OB History   Grav Para Term Preterm Abortions TAB SAB Ect Mult Living                 Review of Systems  Constitutional: Negative for fever.  Respiratory: Negative for cough, chest tightness and  shortness of breath.   Cardiovascular: Negative for chest pain.  Gastrointestinal: Positive for nausea, vomiting and abdominal pain.  Genitourinary: Positive for flank pain. Negative for dysuria and vaginal discharge.  Musculoskeletal: Negative for back pain.  Skin: Negative for wound.  Neurological: Negative for dizziness and syncope.  Psychiatric/Behavioral: Negative for confusion.  All other systems reviewed and are negative.    Allergies  Review of patient's allergies indicates no known allergies.  Home Medications   Current Outpatient Rx  Name  Route  Sig  Dispense  Refill  . HYDROcodone-acetaminophen (NORCO/VICODIN) 5-325 MG per tablet   Oral   Take 1-2 tablets by mouth every 6 (six) hours as needed for pain.   30 tablet   0     1-2 tablets   . lisinopril-hydrochlorothiazide (PRINZIDE,ZESTORETIC) 20-12.5 MG per tablet   Oral   Take 2 tablets by mouth every morning.          . Multiple Vitamin (MULTIVITAMIN) tablet   Oral   Take 1 tablet by mouth daily.         . norethindrone-ethinyl estradiol-iron (LOESTRIN FE 1.5/30) 1.5-30 MG-MCG tablet   Oral   Take 1 tablet by mouth daily.   1 Package   2   . ondansetron (ZOFRAN) 4 MG tablet   Oral   Take 1 tablet (4 mg total) by mouth every 8 (eight) hours as needed for nausea.  30 tablet   1   . azithromycin (ZITHROMAX) 500 MG tablet   Oral   Take 2 tablets (1,000 mg total) by mouth once.   6 tablet   0   . fluconazole (DIFLUCAN) 150 MG tablet   Oral   Take 1 tablet (150 mg total) by mouth once.   1 tablet   0   . metroNIDAZOLE (FLAGYL) 500 MG tablet   Oral   Take 4 tablets (2,000 mg total) by mouth once.   21 tablet   0   . oxyCODONE-acetaminophen (PERCOCET/ROXICET) 5-325 MG per tablet   Oral   Take 1 tablet by mouth every 6 (six) hours as needed for pain.   10 tablet   0    BP 110/73  Pulse 62  Temp(Src) 97.8 F (36.6 C) (Oral)  Resp 18  Ht 5\' 4"  (1.626 m)  Wt 217 lb (98.431 kg)  BMI  37.23 kg/m2  SpO2 95%  LMP 04/29/2013 Physical Exam  Nursing note and vitals reviewed. Constitutional: She is oriented to person, place, and time. She appears well-developed and well-nourished.  HENT:  Head: Normocephalic and atraumatic.  Eyes: Pupils are equal, round, and reactive to light.  Neck: Neck supple.  Cardiovascular: Normal rate, regular rhythm and normal heart sounds.   Pulmonary/Chest: Effort normal. No respiratory distress. She has no wheezes.  Abdominal: Soft. Bowel sounds are normal. She exhibits no distension and no mass. There is no tenderness. There is no rebound.  Neurological: She is alert and oriented to person, place, and time.  Skin: Skin is warm and dry.  Psychiatric: She has a normal mood and affect.    ED Course  Procedures (including critical care time) Labs Review Labs Reviewed  CBC WITH DIFFERENTIAL - Abnormal; Notable for the following:    Hemoglobin 11.3 (*)    HCT 32.5 (*)    All other components within normal limits  COMPREHENSIVE METABOLIC PANEL - Abnormal; Notable for the following:    Potassium 3.3 (*)    Glucose, Bld 138 (*)    Albumin 3.0 (*)    Total Bilirubin 0.2 (*)    GFR calc non Af Amer 87 (*)    All other components within normal limits  URINALYSIS, ROUTINE W REFLEX MICROSCOPIC - Abnormal; Notable for the following:    Color, Urine AMBER (*)    APPearance CLOUDY (*)    Specific Gravity, Urine 1.040 (*)    Bilirubin Urine SMALL (*)    All other components within normal limits  POCT PREGNANCY, URINE   Imaging Review No results found.  MDM   1. Abdominal pain   2. Vomiting   3.  Hypokalemia  This is a 42 year old female who presents with flank pain and vomiting. She is nontoxic-appearing on exam. Vital signs are within normal limits. She is currently pain-free after taking a hydrocodone at home. She is being followed by Dr. Rhea Belton and had a HIDA scan this week. Results are unknown at this time. Basic labwork is reassuring.  Patient was noted to be mildly hypokalemic with a potassium of 3.3. Potassium was replaced. Patient was given Zofran and Phenergan for nausea while in the emergency room.  1:51 PM Abdominal recheck. Patient's abdominal exam continues to be benign. She is nontender. She still endorses nausea but has been able to tolerate by mouth. I offered to evaluate patient with an ultrasound. The patient declined ultrasound stating that she was told that she did have gallstones but that her gallbladder probably  didn't work right. She has deferred further evaluation to Dr. Rhea Belton.  Patient was instructed to followup tomorrow for abdominal recheck. At this time I do not feel she needs further evaluation with a CT scan as her labs are reassuring and her vital signs have been stable with a benign exam. At this time I have low suspicion for appendicitis, pyelonephritis, or colitis.  After history, exam, and medical workup I feel the patient has been appropriately medically screened and is safe for discharge home. Pertinent diagnoses were discussed with the patient. Patient was given return precautions.  Shon Baton, MD 06/10/13 1355

## 2013-06-10 NOTE — ED Notes (Signed)
Pt aware of need of urine sample.

## 2013-06-10 NOTE — ED Notes (Signed)
She tells [place] that she has had recent abd. Issues; and most recently she underwent a H.I.D.A. Scan by Dr. Mahalia Longest. (4 days ago).  She is here today with right flank area pain and n/v since yesterday.  She states her pain has been greatly relieved by taking her home pain med. P.t.a.

## 2013-06-11 ENCOUNTER — Other Ambulatory Visit: Payer: Self-pay | Admitting: Internal Medicine

## 2013-06-11 ENCOUNTER — Telehealth: Payer: Self-pay | Admitting: Internal Medicine

## 2013-06-11 DIAGNOSIS — R1011 Right upper quadrant pain: Secondary | ICD-10-CM

## 2013-06-11 NOTE — Telephone Encounter (Signed)
I have reviewed a faxed copy of her HIDA scan result There was nonfilling of the gallbladder on this study indicating biliary dyskinesia/dysfunction Given her ongoing symptoms, her HIDA scan result, I recommend an urgent surgical evaluation for cholecystectomy If she develops severe recurrent pain, fever, chills before this, she would need to return to the ER

## 2013-06-11 NOTE — Telephone Encounter (Signed)
Referral made to [facility], waiting to hear back regarding appt. Left message for pt to call back.  [facility] has seen their quota of [employer] pts. Pt scheduled to be seen at [facility] 06/18/13, pt to arrive at 3:15pm for a 3:30pm appt. [facility] to mail appt information to patient. Spoke with pt and she is aware of appt date and time. Records faxed to [phone] attention Shelly.

## 2013-06-11 NOTE — Telephone Encounter (Signed)
HIDA scan performed 5 days ago did not result in Epic and therefore I never saw a result. We have called radiology and are awaiting the report, further recommendations once this report is reviewed If it failed to transmit in Epic, a safety zone portal entry should be placed

## 2013-06-11 NOTE — Telephone Encounter (Signed)
Pt called and wanted to let Dr. Rhea Belton that she had another "episode" where the pain lasted longer than it usually does. Pt states she had pain on her right side and nausea. Pt was seen in there ER 06/10/13. Pt was scheduled for a HIDA scan but states they could not complete the scan because they "could not see the gallbladder." Pt wants to know what the next step is in her care. Please advise.

## 2013-06-14 ENCOUNTER — Telehealth: Payer: Self-pay | Admitting: Internal Medicine

## 2013-06-14 NOTE — Telephone Encounter (Signed)
Apparently, pt's appt at [facility] was changed to 06/25/13; lmom for pt to call back.

## 2013-06-14 NOTE — Telephone Encounter (Signed)
Informed pt of her new appt at [facility]; pt stated understanding.

## 2013-06-16 ENCOUNTER — Emergency Department (HOSPITAL_BASED_OUTPATIENT_CLINIC_OR_DEPARTMENT_OTHER)
Admission: EM | Admit: 2013-06-16 | Discharge: 2013-06-16 | Disposition: A | Discharge status: Home / Self Care | Payer: No Typology Code available for payment source | Attending: Emergency Medicine | Admitting: Emergency Medicine

## 2013-06-16 ENCOUNTER — Encounter (HOSPITAL_BASED_OUTPATIENT_CLINIC_OR_DEPARTMENT_OTHER): Payer: Self-pay | Admitting: Emergency Medicine

## 2013-06-16 DIAGNOSIS — R1110 Vomiting, unspecified: Secondary | ICD-10-CM | POA: Insufficient documentation

## 2013-06-16 DIAGNOSIS — Z9851 Tubal ligation status: Secondary | ICD-10-CM | POA: Insufficient documentation

## 2013-06-16 DIAGNOSIS — R1011 Right upper quadrant pain: Secondary | ICD-10-CM | POA: Insufficient documentation

## 2013-06-16 DIAGNOSIS — R109 Unspecified abdominal pain: Secondary | ICD-10-CM

## 2013-06-16 DIAGNOSIS — Z3202 Encounter for pregnancy test, result negative: Secondary | ICD-10-CM | POA: Insufficient documentation

## 2013-06-16 DIAGNOSIS — Z79899 Other long term (current) drug therapy: Secondary | ICD-10-CM | POA: Insufficient documentation

## 2013-06-16 DIAGNOSIS — I10 Essential (primary) hypertension: Secondary | ICD-10-CM | POA: Insufficient documentation

## 2013-06-16 DIAGNOSIS — K828 Other specified diseases of gallbladder: Secondary | ICD-10-CM | POA: Insufficient documentation

## 2013-06-16 LAB — URINALYSIS, ROUTINE W REFLEX MICROSCOPIC
Hgb urine dipstick: NEGATIVE
Ketones, ur: 15 mg/dL — AB
Nitrite: POSITIVE — AB
Protein, ur: 30 mg/dL — AB
Specific Gravity, Urine: 1.039 — ABNORMAL HIGH (ref 1.005–1.030)
Urobilinogen, UA: 4 mg/dL — ABNORMAL HIGH (ref 0.0–1.0)

## 2013-06-16 LAB — COMPREHENSIVE METABOLIC PANEL
AST: 44 U/L — ABNORMAL HIGH (ref 0–37)
Albumin: 3.4 g/dL — ABNORMAL LOW (ref 3.5–5.2)
Alkaline Phosphatase: 69 U/L (ref 39–117)
BUN: 11 mg/dL (ref 6–23)
CO2: 29 mEq/L (ref 19–32)
Chloride: 97 mEq/L (ref 96–112)
GFR calc non Af Amer: 68 mL/min — ABNORMAL LOW (ref >90)
Potassium: 3.1 mEq/L — ABNORMAL LOW (ref 3.5–5.1)
Total Bilirubin: 1.5 mg/dL — ABNORMAL HIGH (ref 0.3–1.2)

## 2013-06-16 LAB — CBC WITH DIFFERENTIAL/PLATELET
Basophils Relative: 0 % (ref 0–1)
HCT: 35.8 % — ABNORMAL LOW (ref 36.0–46.0)
Hemoglobin: 12.1 g/dL (ref 12.0–15.0)
Lymphocytes Relative: 34 % (ref 12–46)
MCHC: 33.8 g/dL (ref 30.0–36.0)
Monocytes Absolute: 0.5 10*3/uL (ref 0.1–1.0)
Monocytes Relative: 7 % (ref 3–12)
Neutro Abs: 4.1 10*3/uL (ref 1.7–7.7)
Neutrophils Relative %: 57 % (ref 43–77)
RBC: 4.39 MIL/uL (ref 3.87–5.11)
WBC: 7.2 10*3/uL (ref 4.0–10.5)

## 2013-06-16 LAB — LIPASE, BLOOD: Lipase: 16 U/L (ref 11–59)

## 2013-06-16 LAB — URINE MICROSCOPIC-ADD ON

## 2013-06-16 MED ORDER — HYDROMORPHONE HCL PF 1 MG/ML IJ SOLN
1.0000 mg | Freq: Once | INTRAMUSCULAR | Status: AC
  Administered 2013-06-16: 1 mg via INTRAVENOUS
  Filled 2013-06-16: qty 1

## 2013-06-16 MED ORDER — METOCLOPRAMIDE HCL 5 MG/ML IJ SOLN
10.0000 mg | Freq: Once | INTRAMUSCULAR | Status: AC
  Administered 2013-06-16: 10 mg via INTRAVENOUS
  Filled 2013-06-16: qty 2

## 2013-06-16 MED ORDER — SODIUM CHLORIDE 0.9 % IV BOLUS (SEPSIS)
2000.0000 mL | Freq: Once | INTRAVENOUS | Status: AC
  Administered 2013-06-16: 2000 mL via INTRAVENOUS

## 2013-06-16 NOTE — ED Notes (Signed)
Pt reports sudden onset of right flank pain this am + n/v denies diarrhea denies event prior to episode

## 2013-06-16 NOTE — ED Provider Notes (Signed)
CSN: [id number]     Arrival date & time 06/16/13  1907 History  This chart was scribed for Hurman Horn, MD by Ronal Fear, ED Scribe. This patient was seen in room MH01/MH01 and the patient's care was started at 8:35 PM.     Chief Complaint  Patient presents with  . Flank Pain   HPI HPI Comments: Becky Vazquez is a 42 y.o. female who presents to the Emergency Department complaining of months of multiple spells of right upper quadrat flank pain sudden onset .  she has had 5 similar occurences this month. Pt will have a follow up 9 days from today. Typically has several vomiting episodes but today Pt just had a couple episodes that were non-bloody. Today's pain ranged from mild to severe and is currently mild; spells usually last few hours, but several hours today.   Pt had endoscopy and hx flank pain  Past Medical History  Diagnosis Date  . Hypertension    Past Surgical History  Procedure Laterality Date  . Tubal ligation     Family History  Problem Relation Age of Onset  . Stomach cancer Mother   . Cancer Mother   . Prostate cancer Father   . Cancer Father   . Breast cancer Maternal Aunt   . Breast cancer Paternal Aunt    History  Substance Use Topics  . Smoking status: Never Smoker   . Smokeless tobacco: Never Used  . Alcohol Use: No   OB History   Grav Para Term Preterm Abortions TAB SAB Ect Mult Living                 Review of Systems  10 Systems reviewed and are negative for acute change except as noted in the HPI.    Allergies  Review of patient's allergies indicates no known allergies.  Home Medications   Current Outpatient Rx  Name  Route  Sig  Dispense  Refill  . azithromycin (ZITHROMAX) 500 MG tablet   Oral   Take 2 tablets (1,000 mg total) by mouth once.   6 tablet   0   . fluconazole (DIFLUCAN) 150 MG tablet   Oral   Take 1 tablet (150 mg total) by mouth once.   1 tablet   0   . HYDROcodone-acetaminophen (NORCO/VICODIN) 5-325 MG per  tablet   Oral   Take 1-2 tablets by mouth every 6 (six) hours as needed for pain.   30 tablet   0     1-2 tablets   . lisinopril-hydrochlorothiazide (PRINZIDE,ZESTORETIC) 20-12.5 MG per tablet   Oral   Take 2 tablets by mouth every morning.          . metroNIDAZOLE (FLAGYL) 500 MG tablet   Oral   Take 4 tablets (2,000 mg total) by mouth once.   21 tablet   0   . Multiple Vitamin (MULTIVITAMIN) tablet   Oral   Take 1 tablet by mouth daily.         . norethindrone-ethinyl estradiol-iron (LOESTRIN FE 1.5/30) 1.5-30 MG-MCG tablet   Oral   Take 1 tablet by mouth daily.   1 Package   2   . ondansetron (ZOFRAN) 4 MG tablet   Oral   Take 1 tablet (4 mg total) by mouth every 8 (eight) hours as needed for nausea.   30 tablet   1   . oxyCODONE-acetaminophen (PERCOCET/ROXICET) 5-325 MG per tablet   Oral   Take 1 tablet by mouth every 6 (  six) hours as needed for pain.   10 tablet   0    BP 119/84  Pulse 82  Temp(Src) 98.4 F (36.9 C) (Oral)  Resp 18  Ht 5\' 4"  (1.626 m)  Wt 217 lb (98.431 kg)  BMI 37.23 kg/m2  SpO2 97%  LMP 04/29/2013 Physical Exam  Nursing note and vitals reviewed. Constitutional:  Awake, alert, nontoxic appearance.  HENT:  Head: Atraumatic.  Eyes: Right eye exhibits no discharge. Left eye exhibits no discharge.  Neck: Neck supple.  Pulmonary/Chest: Effort normal and breath sounds normal. No respiratory distress. She has no wheezes. She has no rales. She exhibits no tenderness.  Abdominal: Soft. Bowel sounds are normal. There is no tenderness. There is no rebound and no guarding.  No CVAT. Moderate tenderness epigastric quadrant. rest of abd non tender.   Musculoskeletal: She exhibits no tenderness.  Baseline ROM, no obvious new focal weakness.  Neurological:  Mental status and motor strength appears baseline for patient and situation.  Skin: No rash noted.  Psychiatric: She has a normal mood and affect.    ED Course  Procedures  (including critical care time)  DIAGNOSTIC STUDIES: Oxygen Saturation is 97% on RA, adequate by my interpretation.    COORDINATION OF CARE: 8:42 PM Patient / Family / Caregiver informed of clinical course,incluidng medication for pain and urinalysis. Pt understands medical decision-making process, and agrees with plan.  Pain resolved recheck abdomen soft nontender. 2220    Labs Review Labs Reviewed  URINALYSIS, ROUTINE W REFLEX MICROSCOPIC - Abnormal; Notable for the following:    Color, Urine ORANGE (*)    APPearance CLOUDY (*)    Specific Gravity, Urine 1.039 (*)    Bilirubin Urine MODERATE (*)    Ketones, ur 15 (*)    Protein, ur 30 (*)    Urobilinogen, UA 4.0 (*)    Nitrite POSITIVE (*)    Leukocytes, UA TRACE (*)    All other components within normal limits  URINE MICROSCOPIC-ADD ON - Abnormal; Notable for the following:    Squamous Epithelial / LPF MANY (*)    Bacteria, UA FEW (*)    All other components within normal limits  CBC WITH DIFFERENTIAL - Abnormal; Notable for the following:    HCT 35.8 (*)    All other components within normal limits  COMPREHENSIVE METABOLIC PANEL - Abnormal; Notable for the following:    Potassium 3.1 (*)    Glucose, Bld 131 (*)    Albumin 3.4 (*)    AST 44 (*)    Total Bilirubin 1.5 (*)    GFR calc non Af Amer 68 (*)    GFR calc Af Amer 79 (*)    All other components within normal limits  PREGNANCY, URINE  LIPASE, BLOOD   Imaging Review No results found.  MDM   1. Biliary dyskinesia   2. Abdominal pain    I doubt any other EMC precluding discharge at this time including, but not necessarily limited to the following:cholecystitis, sepsis, peritonitis.  I personally performed the services described in this documentation, which was scribed in my presence. The recorded information has been reviewed and is accurate.    Hurman Horn, MD 06/17/13 1336

## 2013-08-02 ENCOUNTER — Other Ambulatory Visit: Payer: Self-pay

## 2013-11-08 ENCOUNTER — Emergency Department (HOSPITAL_COMMUNITY)
Admission: EM | Admit: 2013-11-08 | Discharge: 2013-11-08 | Disposition: A | Discharge status: Home / Self Care | Payer: BC Managed Care – PPO | Attending: Emergency Medicine | Admitting: Emergency Medicine

## 2013-11-08 ENCOUNTER — Emergency Department (HOSPITAL_COMMUNITY): Payer: BC Managed Care – PPO

## 2013-11-08 ENCOUNTER — Encounter (HOSPITAL_COMMUNITY): Payer: Self-pay | Admitting: Emergency Medicine

## 2013-11-08 DIAGNOSIS — R55 Syncope and collapse: Secondary | ICD-10-CM | POA: Insufficient documentation

## 2013-11-08 DIAGNOSIS — Z3202 Encounter for pregnancy test, result negative: Secondary | ICD-10-CM | POA: Insufficient documentation

## 2013-11-08 DIAGNOSIS — Z79899 Other long term (current) drug therapy: Secondary | ICD-10-CM | POA: Insufficient documentation

## 2013-11-08 DIAGNOSIS — I10 Essential (primary) hypertension: Secondary | ICD-10-CM | POA: Insufficient documentation

## 2013-11-08 LAB — COMPREHENSIVE METABOLIC PANEL
ALT: 7 U/L (ref 0–35)
AST: 13 U/L (ref 0–37)
Albumin: 3.4 g/dL — ABNORMAL LOW (ref 3.5–5.2)
Alkaline Phosphatase: 49 U/L (ref 39–117)
BUN: 11 mg/dL (ref 6–23)
CALCIUM: 9.5 mg/dL (ref 8.4–10.5)
CO2: 24 mEq/L (ref 19–32)
CREATININE: 0.94 mg/dL (ref 0.50–1.10)
Chloride: 99 mEq/L (ref 96–112)
GFR, EST AFRICAN AMERICAN: 86 mL/min — AB (ref >90)
GFR, EST NON AFRICAN AMERICAN: 74 mL/min — AB (ref >90)
GLUCOSE: 105 mg/dL — AB (ref 70–99)
Potassium: 4.3 mEq/L (ref 3.7–5.3)
Sodium: 138 mEq/L (ref 137–147)
Total Bilirubin: 0.3 mg/dL (ref 0.3–1.2)
Total Protein: 7.8 g/dL (ref 6.0–8.3)

## 2013-11-08 LAB — GLUCOSE, CAPILLARY: GLUCOSE-CAPILLARY: 83 mg/dL (ref 70–99)

## 2013-11-08 LAB — CBC WITH DIFFERENTIAL/PLATELET
BASOS ABS: 0 10*3/uL (ref 0.0–0.1)
BASOS PCT: 0 % (ref 0–1)
EOS ABS: 0.2 10*3/uL (ref 0.0–0.7)
EOS PCT: 2 % (ref 0–5)
HCT: 37.5 % (ref 36.0–46.0)
Hemoglobin: 12.7 g/dL (ref 12.0–15.0)
Lymphocytes Relative: 31 % (ref 12–46)
Lymphs Abs: 2.3 10*3/uL (ref 0.7–4.0)
MCH: 28.2 pg (ref 26.0–34.0)
MCHC: 33.9 g/dL (ref 30.0–36.0)
MCV: 83.1 fL (ref 78.0–100.0)
Monocytes Absolute: 0.6 10*3/uL (ref 0.1–1.0)
Monocytes Relative: 9 % (ref 3–12)
NEUTROS PCT: 58 % (ref 43–77)
Neutro Abs: 4.3 10*3/uL (ref 1.7–7.7)
PLATELETS: 330 10*3/uL (ref 150–400)
RBC: 4.51 MIL/uL (ref 3.87–5.11)
RDW: 14.6 % (ref 11.5–15.5)
WBC: 7.4 10*3/uL (ref 4.0–10.5)

## 2013-11-08 LAB — URINALYSIS, ROUTINE W REFLEX MICROSCOPIC
Bilirubin Urine: NEGATIVE
Glucose, UA: NEGATIVE mg/dL
HGB URINE DIPSTICK: NEGATIVE
Ketones, ur: NEGATIVE mg/dL
NITRITE: NEGATIVE
Protein, ur: NEGATIVE mg/dL
SPECIFIC GRAVITY, URINE: 1.017 (ref 1.005–1.030)
UROBILINOGEN UA: 0.2 mg/dL (ref 0.0–1.0)
pH: 5.5 (ref 5.0–8.0)

## 2013-11-08 LAB — URINE MICROSCOPIC-ADD ON

## 2013-11-08 LAB — PREGNANCY, URINE: PREG TEST UR: NEGATIVE

## 2013-11-08 MED ORDER — SODIUM CHLORIDE 0.9 % IV BOLUS (SEPSIS)
1000.0000 mL | Freq: Once | INTRAVENOUS | Status: AC
  Administered 2013-11-08: 1000 mL via INTRAVENOUS

## 2013-11-08 NOTE — ED Provider Notes (Signed)
Medical screening examination/treatment/procedure(s) were performed by non-physician practitioner and as supervising physician I was immediately available for consultation/collaboration.  EKG Interpretation   None        Date: 11/08/2013  Rate: 64  Rhythm: normal sinus rhythm  QRS Axis: normal  Intervals: normal  ST/T Wave abnormalities: T wave flattening AVL  Conduction Disutrbances:none  Narrative Interpretation:   Old EKG Reviewed: none available    Ephraim Hamburger, MD 11/08/13 2355

## 2013-11-08 NOTE — ED Notes (Signed)
Bed: WA02 Expected date:  Expected time:  Means of arrival:  Comments: ems- 43 yo F, syncopal episode, hit nose, n/v

## 2013-11-08 NOTE — ED Notes (Signed)
Pt ambulated to restroom. 

## 2013-11-08 NOTE — ED Notes (Signed)
Pt to ED from home after syncopal episode. Denies chest pain or SOB. Pt has small laceration across bridge of nose.

## 2013-11-08 NOTE — ED Provider Notes (Signed)
CSN: [id number]     Arrival date & time 11/08/13  1745 History   First MD Initiated Contact with Patient 11/08/13 1747     Chief Complaint  Patient presents with  . Loss of Consciousness     (Consider location/radiation/quality/duration/timing/severity/associated sxs/prior Treatment) HPI  Patient brought to the emergency department by EMS after syncopal episode. She reports not sleeping well and came home to rest after work. She reports falling asleep on the couch then abruptly getting up off the couch and walking approx 5 steps to the coffee table to check her phone. Next thing she knows her friend is waking her up off the floor. Her friend witnessed the whole event. Said patient walked to the table, then fell down, she went over there and asked the patient if she was okay and she immediately responded with no confusion. Friend says the whole incident last only a couple of seconds. No confusion, no symptoms recently or before passing out. She did not loose bowel or bladder control. She has no complaints now. She has a tiny cut to the bridge of her nose but otherwise not pains. Normal vital signs.  Past Medical History  Diagnosis Date  . Hypertension    Past Surgical History  Procedure Laterality Date  . Tubal ligation    . Cholecystectomy     Family History  Problem Relation Age of Onset  . Stomach cancer Mother   . Cancer Mother   . Prostate cancer Father   . Cancer Father   . Breast cancer Maternal Aunt   . Breast cancer Paternal Aunt    History  Substance Use Topics  . Smoking status: Never Smoker   . Smokeless tobacco: Never Used  . Alcohol Use: No   OB History   Grav Para Term Preterm Abortions TAB SAB Ect Mult Living                 Review of Systems  The patient denies anorexia, fever, weight loss,, vision loss, decreased hearing, hoarseness, chest pain,dyspnea on exertion, peripheral edema, balance deficits, hemoptysis, abdominal pain, melena, hematochezia,  severe indigestion/heartburn, hematuria, incontinence, genital sores, muscle weakness, suspicious skin lesions, transient blindness, difficulty walking, depression, unusual weight change, abnormal bleeding, enlarged lymph nodes, angioedema, and breast masses.   Allergies  Review of patient's allergies indicates no known allergies.  Home Medications   Current Outpatient Rx  Name  Route  Sig  Dispense  Refill  . lisinopril-hydrochlorothiazide (PRINZIDE,ZESTORETIC) 20-12.5 MG per tablet   Oral   Take 2 tablets by mouth every morning.          . norethindrone-ethinyl estradiol-iron (LOESTRIN FE 1.5/30) 1.5-30 MG-MCG tablet   Oral   Take 1 tablet by mouth daily.   1 Package   2    BP 139/86  Pulse 72  Temp(Src) 98.5 F (36.9 C) (Oral)  Resp 16  Ht 5\' 2"  (1.575 m)  Wt 217 lb (98.431 kg)  BMI 39.68 kg/m2  SpO2 95%  LMP 11/08/2013 Physical Exam  Nursing note and vitals reviewed. Constitutional: She is oriented to person, place, and time. She appears well-developed and well-nourished. No distress.  HENT:  Head: Normocephalic and atraumatic.  Left Ear: External ear normal.  Nose: Nose normal.  Eyes: Pupils are equal, round, and reactive to light.  Neck: Normal range of motion. Neck supple.  Cardiovascular: Normal rate and regular rhythm.   Pulmonary/Chest: Effort normal. No respiratory distress. She has no wheezes.  Abdominal: Soft.  Musculoskeletal:  Full  strength and ROM in all 4 extremities  Neurological: She is alert and oriented to person, place, and time. She has normal strength. No cranial nerve deficit or sensory deficit. GCS eye subscore is 4. GCS verbal subscore is 5. GCS motor subscore is 6.  Skin: Skin is warm and dry. She is not diaphoretic.  Psychiatric: She has a normal mood and affect. Her speech is normal and behavior is normal.    ED Course  Procedures (including critical care time) Labs Review Labs Reviewed  URINALYSIS, ROUTINE W REFLEX MICROSCOPIC -  Abnormal; Notable for the following:    Leukocytes, UA SMALL (*)    All other components within normal limits  COMPREHENSIVE METABOLIC PANEL - Abnormal; Notable for the following:    Glucose, Bld 105 (*)    Albumin 3.4 (*)    GFR calc non Af Amer 74 (*)    GFR calc Af Amer 86 (*)    All other components within normal limits  PREGNANCY, URINE  GLUCOSE, CAPILLARY  CBC WITH DIFFERENTIAL  URINE MICROSCOPIC-ADD ON   Imaging Review Dg Chest 2 View  11/08/2013   CLINICAL DATA:  Syncopal episode, hypertension  EXAM: CHEST  2 VIEW  COMPARISON:  03/21/2013  FINDINGS: The heart size and mediastinal contours are within normal limits. Both lungs are clear. The visualized skeletal structures are unremarkable.  IMPRESSION: No active cardiopulmonary disease.   Electronically Signed   By: Inez Catalina M.D.   On: 11/08/2013 21:04    EKG Interpretation   None       MDM   Final diagnoses:  Syncope    Patient most likely had a vagal episode.  Will check orthostatics and blood work. CBG is physiologic in triage.  Patient ambulated by myself without any adverse events. She says she still feels 100%  She see's her PCP dr. Sheryle Hail on Monday. She will follow-up wclosely with him.  43 y.o.Becky Vazquez's evaluation in the Emergency Department is complete. It has been determined that no acute conditions requiring further emergency intervention are present at this time. The patient/guardian have been advised of the diagnosis and plan. We have discussed signs and symptoms that warrant return to the ED, such as changes or worsening in symptoms.  Vital signs are stable at discharge. Filed Vitals:   11/08/13 1859  BP: 139/86  Pulse: 72  Temp:   Resp:     Patient/guardian has voiced understanding and agreed to follow-up with the PCP or specialist.   Linus Mako, PA-C 11/08/13 2139

## 2013-11-08 NOTE — Discharge Instructions (Signed)
Syncope  Syncope is a fainting spell. This means the person loses consciousness and drops to the ground. The person is generally unconscious for less than 5 minutes. The person may have some muscle twitches for up to 15 seconds before waking up and returning to normal. Syncope occurs more often in elderly people, but it can happen to anyone. While most causes of syncope are not dangerous, syncope can be a sign of a serious medical problem. It is important to seek medical care.   CAUSES   Syncope is caused by a sudden decrease in blood flow to the brain. The specific cause is often not determined. Factors that can trigger syncope include:   Taking medicines that lower blood pressure.   Sudden changes in posture, such as standing up suddenly.   Taking more medicine than prescribed.   Standing in one place for too long.   Seizure disorders.   Dehydration and excessive exposure to heat.   Low blood sugar (hypoglycemia).   Straining to have a bowel movement.   Heart disease, irregular heartbeat, or other circulatory problems.   Fear, emotional distress, seeing blood, or severe pain.  SYMPTOMS   Right before fainting, you may:   Feel dizzy or lightheaded.   Feel nauseous.   See all white or all black in your field of vision.   Have cold, clammy skin.  DIAGNOSIS   Your caregiver will ask about your symptoms, perform a physical exam, and perform electrocardiography (ECG) to record the electrical activity of your heart. Your caregiver may also perform other heart or blood tests to determine the cause of your syncope.  TREATMENT   In most cases, no treatment is needed. Depending on the cause of your syncope, your caregiver may recommend changing or stopping some of your medicines.  HOME CARE INSTRUCTIONS   Have someone stay with you until you feel stable.   Do not drive, operate machinery, or play sports until your caregiver says it is okay.   Keep all follow-up appointments as directed by your  caregiver.   Lie down right away if you start feeling like you might faint. Breathe deeply and steadily. Wait until all the symptoms have passed.   Drink enough fluids to keep your urine clear or pale yellow.   If you are taking blood pressure or heart medicine, get up slowly, taking several minutes to sit and then stand. This can reduce dizziness.  SEEK IMMEDIATE MEDICAL CARE IF:    You have a severe headache.   You have unusual pain in the chest, abdomen, or back.   You are bleeding from the mouth or rectum, or you have black or tarry stool.   You have an irregular or very fast heartbeat.   You have pain with breathing.   You have repeated fainting or seizure-like jerking during an episode.   You faint when sitting or lying down.   You have confusion.   You have difficulty walking.   You have severe weakness.   You have vision problems.  If you fainted, call your local emergency services (911 in [place] Do not drive yourself to the hospital.   MAKE SURE YOU:   Understand these instructions.   Will watch your condition.   Will get help right away if you are not doing well or get worse.  Document Released: 09/13/2005 Document Revised: 03/14/2012 Document Reviewed: 11/12/2011  [employer] Patient Information 2014 [employer].

## 2013-11-08 NOTE — ED Notes (Signed)
Pt made aware of need for urine specimen 

## 2013-11-21 ENCOUNTER — Emergency Department (HOSPITAL_COMMUNITY)
Admission: EM | Admit: 2013-11-21 | Discharge: 2013-11-21 | Disposition: A | Discharge status: Home / Self Care | Payer: BC Managed Care – PPO | Attending: Emergency Medicine | Admitting: Emergency Medicine

## 2013-11-21 ENCOUNTER — Emergency Department (HOSPITAL_COMMUNITY): Payer: BC Managed Care – PPO

## 2013-11-21 ENCOUNTER — Encounter (HOSPITAL_COMMUNITY): Payer: Self-pay | Admitting: Emergency Medicine

## 2013-11-21 DIAGNOSIS — R05 Cough: Secondary | ICD-10-CM | POA: Insufficient documentation

## 2013-11-21 DIAGNOSIS — R059 Cough, unspecified: Secondary | ICD-10-CM | POA: Insufficient documentation

## 2013-11-21 DIAGNOSIS — R112 Nausea with vomiting, unspecified: Secondary | ICD-10-CM | POA: Insufficient documentation

## 2013-11-21 DIAGNOSIS — Z79899 Other long term (current) drug therapy: Secondary | ICD-10-CM | POA: Insufficient documentation

## 2013-11-21 DIAGNOSIS — Z792 Long term (current) use of antibiotics: Secondary | ICD-10-CM | POA: Insufficient documentation

## 2013-11-21 DIAGNOSIS — I10 Essential (primary) hypertension: Secondary | ICD-10-CM | POA: Insufficient documentation

## 2013-11-21 DIAGNOSIS — J329 Chronic sinusitis, unspecified: Secondary | ICD-10-CM

## 2013-11-21 MED ORDER — IBUPROFEN 200 MG PO TABS
600.0000 mg | ORAL_TABLET | Freq: Once | ORAL | Status: AC
  Administered 2013-11-21: 600 mg via ORAL
  Filled 2013-11-21: qty 3

## 2013-11-21 MED ORDER — METOCLOPRAMIDE HCL 10 MG PO TABS
10.0000 mg | ORAL_TABLET | Freq: Once | ORAL | Status: AC
  Administered 2013-11-21: 10 mg via ORAL
  Filled 2013-11-21: qty 1

## 2013-11-21 NOTE — ED Provider Notes (Signed)
CSN: [id number]     Arrival date & time 11/21/13  0350 History   First MD Initiated Contact with Patient 11/21/13 0848     Chief Complaint  Patient presents with  . Nasal Congestion  . Headache     (Consider location/radiation/quality/duration/timing/severity/associated sxs/prior Treatment) Patient is a 43 y.o. female presenting with headaches.  Headache Pain location:  Frontal Quality:  Dull (throbbing) Radiates to:  Does not radiate Pain severity now: severe. Onset quality:  Gradual Duration:  2 days Timing:  Constant Progression:  Unchanged Chronicity:  New Context comment:  Diagnosed with bronchitis by PCP a few days ago.   Relieved by: ibuprofen helped yesteday. Worsened by:  Light Associated symptoms: congestion, cough, nausea, sinus pressure, URI and vomiting   Associated symptoms: no fever     Past Medical History  Diagnosis Date  . Hypertension    Past Surgical History  Procedure Laterality Date  . Tubal ligation    . Cholecystectomy     Family History  Problem Relation Age of Onset  . Stomach cancer Mother   . Cancer Mother   . Prostate cancer Father   . Cancer Father   . Breast cancer Maternal Aunt   . Breast cancer Paternal Aunt    History  Substance Use Topics  . Smoking status: Never Smoker   . Smokeless tobacco: Never Used  . Alcohol Use: No   OB History   Grav Para Term Preterm Abortions TAB SAB Ect Mult Living                 Review of Systems  Constitutional: Negative for fever.  HENT: Positive for congestion and sinus pressure.   Respiratory: Positive for cough.   Gastrointestinal: Positive for nausea and vomiting.  Neurological: Positive for headaches.  All other systems reviewed and are negative.      Allergies  Review of patient's allergies indicates no known allergies.  Home Medications   Current Outpatient Rx  Name  Route  Sig  Dispense  Refill  . lisinopril-hydrochlorothiazide (PRINZIDE,ZESTORETIC) 20-12.5 MG per  tablet   Oral   Take 2 tablets by mouth every morning.          . metroNIDAZOLE (FLAGYL) 500 MG tablet   Oral   Take 500 mg by mouth 2 (two) times daily. For 7 days         . norethindrone-ethinyl estradiol-iron (LOESTRIN FE 1.5/30) 1.5-30 MG-MCG tablet   Oral   Take 1 tablet by mouth daily.   1 Package   2   . sulfamethoxazole-trimethoprim (BACTRIM DS) 800-160 MG per tablet   Oral   Take 1 tablet by mouth 2 (two) times daily. For 7 days          BP 137/88  Pulse 106  Temp(Src) 99.1 F (37.3 C) (Oral)  Resp 20  SpO2 96%  LMP 11/08/2013 Physical Exam  Nursing note and vitals reviewed. Constitutional: She is oriented to person, place, and time. She appears well-developed and well-nourished. No distress.  HENT:  Head: Normocephalic and atraumatic.  Right Ear: Tympanic membrane and ear canal normal.  Left Ear: Tympanic membrane and ear canal normal.  Nose: Right sinus exhibits frontal sinus tenderness. Left sinus exhibits frontal sinus tenderness.  Mouth/Throat: Oropharynx is clear and moist.  Eyes: Conjunctivae are normal. Pupils are equal, round, and reactive to light. No scleral icterus.  Neck: Neck supple.  Cardiovascular: Normal rate, regular rhythm, normal heart sounds and intact distal pulses.   No murmur heard.  Pulmonary/Chest: Effort normal and breath sounds normal. No stridor. No respiratory distress. She has no rales.  Abdominal: Soft. Bowel sounds are normal. She exhibits no distension. There is no tenderness.  Musculoskeletal: Normal range of motion.  Neurological: She is alert and oriented to person, place, and time.  Skin: Skin is warm and dry. No rash noted.  Psychiatric: She has a normal mood and affect. Her behavior is normal.    ED Course  Procedures (including critical care time) Labs Review Labs Reviewed - No data to display Imaging Review Dg Chest 2 View  11/21/2013   CLINICAL DATA:  Hypertension  EXAM: CHEST  2 VIEW  COMPARISON:   11/08/2013  FINDINGS: Cardiomediastinal silhouette is stable. No acute infiltrate or pleural effusion. No pulmonary edema. Bony thorax is unremarkable.  IMPRESSION: No active cardiopulmonary disease.   Electronically Signed   By: Lahoma Crocker M.D.   On: 11/21/2013 08:41  All radiology studies independently viewed by me.      EKG Interpretation   None       MDM   Final diagnoses:  Sinusitis    42 yo female with chief complaint of headache starting yesterday. Symptoms consistent with sinus headache (likely from viral sinusitis.)  I doubt meningitis, (no fevers, neck stiffness, meningeal signs.)  Story inconsistent with SAH.  She has had some nausea and vomiting brought on by a coughing fit (pt states she is prone to vomiting).  Plan symptomatic treatment.    Pt felt better after reglan and ibuprofen.  dc'd home with return precautions.   Houston Siren III, MD 11/21/13 [phone]

## 2013-11-21 NOTE — ED Notes (Signed)
Pt with productive cough.  No cxr done at MD office.

## 2013-11-21 NOTE — ED Notes (Signed)
Pt treated Monday for bronchitis and started on antibiotic.  Pt continues with cough and congestion which is causing headache and upper chest discomfort.

## 2013-11-21 NOTE — Discharge Instructions (Signed)
Sinus Headache A sinus headache happens when your sinuses become clogged or puffy (swollen). Sinus headaches can be mild or severe. HOME CARE  Take your medicines (antibiotics) as told. Finish them even if you start to feel better.  Only take medicine as told by your doctor.  Use a nose spray if you feel stuffed up (congested). GET HELP RIGHT AWAY IF:  You have a fever.  You have trouble seeing.  You suddenly have pain in your face or head.  You start to twitch or shake (seizure).  You are confused.  You get headaches more than once a week.  Light or sound bothers you.  You feel sick to your stomach (nauseous) or throw up (vomit).  Your headaches do not get better with treatment. MAKE SURE YOU:  Understand these instructions.  Will watch your condition.  Will get help right away if you are not doing well or get worse. Document Released: 01/13/2011 Document Revised: 12/06/2011 Document Reviewed: 01/13/2011 [facility] Patient Information 2014 [place], [place].  Sinusitis Sinusitis is redness, soreness, and swelling (inflammation) of the paranasal sinuses. Paranasal sinuses are air pockets within the bones of your face (beneath the eyes, the middle of the forehead, or above the eyes). In healthy paranasal sinuses, mucus is able to drain out, and air is able to circulate through them by way of your nose. However, when your paranasal sinuses are inflamed, mucus and air can become trapped. This can allow bacteria and other germs to grow and cause infection. Sinusitis can develop quickly and last only a short time (acute) or continue over a long period (chronic). Sinusitis that lasts for more than 12 weeks is considered chronic.  CAUSES  Causes of sinusitis include:  Allergies.  Structural abnormalities, such as displacement of the cartilage that separates your nostrils (deviated septum), which can decrease the air flow through your nose and sinuses and affect sinus  drainage.  Functional abnormalities, such as when the small hairs (cilia) that line your sinuses and help remove mucus do not work properly or are not present. SYMPTOMS  Symptoms of acute and chronic sinusitis are the same. The primary symptoms are pain and pressure around the affected sinuses. Other symptoms include:  Upper toothache.  Earache.  Headache.  Bad breath.  Decreased sense of smell and taste.  A cough, which worsens when you are lying flat.  Fatigue.  Fever.  Thick drainage from your nose, which often is green and may contain pus (purulent).  Swelling and warmth over the affected sinuses. DIAGNOSIS  Your caregiver will perform a physical exam. During the exam, your caregiver may:  Look in your nose for signs of abnormal growths in your nostrils (nasal polyps).  Tap over the affected sinus to check for signs of infection.  View the inside of your sinuses (endoscopy) with a special imaging device with a light attached (endoscope), which is inserted into your sinuses. If your caregiver suspects that you have chronic sinusitis, one or more of the following tests may be recommended:  Allergy tests.  Nasal culture A sample of mucus is taken from your nose and sent to a lab and screened for bacteria.  Nasal cytology A sample of mucus is taken from your nose and examined by your caregiver to determine if your sinusitis is related to an allergy. TREATMENT  Most cases of acute sinusitis are related to a viral infection and will resolve on their own within 10 days. Sometimes medicines are prescribed to help relieve symptoms (pain medicine,  decongestants, nasal steroid sprays, or saline sprays).  However, for sinusitis related to a bacterial infection, your caregiver will prescribe antibiotic medicines. These are medicines that will help kill the bacteria causing the infection.  Rarely, sinusitis is caused by a fungal infection. In theses cases, your caregiver will  prescribe antifungal medicine. For some cases of chronic sinusitis, surgery is needed. Generally, these are cases in which sinusitis recurs more than 3 times per year, despite other treatments. HOME CARE INSTRUCTIONS   Drink plenty of water. Water helps thin the mucus so your sinuses can drain more easily.  Use a humidifier.  Inhale steam 3 to 4 times a day (for example, sit in the bathroom with the shower running).  Apply a warm, moist washcloth to your face 3 to 4 times a day, or as directed by your caregiver.  Use saline nasal sprays to help moisten and clean your sinuses.  Take over-the-counter or prescription medicines for pain, discomfort, or fever only as directed by your caregiver. SEEK IMMEDIATE MEDICAL CARE IF:  You have increasing pain or severe headaches.  You have nausea, vomiting, or drowsiness.  You have swelling around your face.  You have vision problems.  You have a stiff neck.  You have difficulty breathing. MAKE SURE YOU:   Understand these instructions.  Will watch your condition.  Will get help right away if you are not doing well or get worse. Document Released: 09/13/2005 Document Revised: 12/06/2011 Document Reviewed: 09/28/2011 [facility] Patient Information 2014 [place], [place].

## 2013-11-28 ENCOUNTER — Encounter (HOSPITAL_COMMUNITY): Payer: Self-pay | Admitting: Emergency Medicine

## 2013-11-28 ENCOUNTER — Emergency Department (HOSPITAL_COMMUNITY)
Admission: EM | Admit: 2013-11-28 | Discharge: 2013-11-29 | Disposition: A | Discharge status: Home / Self Care | Payer: BC Managed Care – PPO | Attending: Emergency Medicine | Admitting: Emergency Medicine

## 2013-11-28 DIAGNOSIS — K529 Noninfective gastroenteritis and colitis, unspecified: Secondary | ICD-10-CM

## 2013-11-28 DIAGNOSIS — Z79899 Other long term (current) drug therapy: Secondary | ICD-10-CM | POA: Insufficient documentation

## 2013-11-28 DIAGNOSIS — K5289 Other specified noninfective gastroenteritis and colitis: Secondary | ICD-10-CM | POA: Insufficient documentation

## 2013-11-28 DIAGNOSIS — Z3202 Encounter for pregnancy test, result negative: Secondary | ICD-10-CM | POA: Insufficient documentation

## 2013-11-28 DIAGNOSIS — Z792 Long term (current) use of antibiotics: Secondary | ICD-10-CM | POA: Insufficient documentation

## 2013-11-28 DIAGNOSIS — R000 Tachycardia, unspecified: Secondary | ICD-10-CM | POA: Insufficient documentation

## 2013-11-28 DIAGNOSIS — I10 Essential (primary) hypertension: Secondary | ICD-10-CM | POA: Insufficient documentation

## 2013-11-28 LAB — URINE MICROSCOPIC-ADD ON

## 2013-11-28 LAB — COMPREHENSIVE METABOLIC PANEL
ALBUMIN: 3.3 g/dL — AB (ref 3.5–5.2)
ALK PHOS: 42 U/L (ref 39–117)
ALT: 10 U/L (ref 0–35)
AST: 12 U/L (ref 0–37)
BUN: 12 mg/dL (ref 6–23)
CO2: 27 mEq/L (ref 19–32)
Calcium: 9.3 mg/dL (ref 8.4–10.5)
Chloride: 95 mEq/L — ABNORMAL LOW (ref 96–112)
Creatinine, Ser: 1.06 mg/dL (ref 0.50–1.10)
GFR calc Af Amer: 74 mL/min — ABNORMAL LOW (ref >90)
GFR calc non Af Amer: 64 mL/min — ABNORMAL LOW (ref >90)
Glucose, Bld: 127 mg/dL — ABNORMAL HIGH (ref 70–99)
POTASSIUM: 3.4 meq/L — AB (ref 3.7–5.3)
SODIUM: 137 meq/L (ref 137–147)
TOTAL PROTEIN: 7.7 g/dL (ref 6.0–8.3)
Total Bilirubin: 0.5 mg/dL (ref 0.3–1.2)

## 2013-11-28 LAB — URINALYSIS, ROUTINE W REFLEX MICROSCOPIC
Bilirubin Urine: NEGATIVE
Glucose, UA: NEGATIVE mg/dL
HGB URINE DIPSTICK: NEGATIVE
Ketones, ur: NEGATIVE mg/dL
NITRITE: NEGATIVE
Protein, ur: NEGATIVE mg/dL
SPECIFIC GRAVITY, URINE: 1.023 (ref 1.005–1.030)
UROBILINOGEN UA: 0.2 mg/dL (ref 0.0–1.0)
pH: 5 (ref 5.0–8.0)

## 2013-11-28 LAB — CBC WITH DIFFERENTIAL/PLATELET
BASOS PCT: 0 % (ref 0–1)
Basophils Absolute: 0 10*3/uL (ref 0.0–0.1)
Eosinophils Absolute: 0.1 10*3/uL (ref 0.0–0.7)
Eosinophils Relative: 1 % (ref 0–5)
HCT: 33.6 % — ABNORMAL LOW (ref 36.0–46.0)
Hemoglobin: 11.3 g/dL — ABNORMAL LOW (ref 12.0–15.0)
Lymphocytes Relative: 6 % — ABNORMAL LOW (ref 12–46)
Lymphs Abs: 0.6 10*3/uL — ABNORMAL LOW (ref 0.7–4.0)
MCH: 27.2 pg (ref 26.0–34.0)
MCHC: 33.6 g/dL (ref 30.0–36.0)
MCV: 81 fL (ref 78.0–100.0)
MONOS PCT: 4 % (ref 3–12)
Monocytes Absolute: 0.4 10*3/uL (ref 0.1–1.0)
Neutro Abs: 10 10*3/uL — ABNORMAL HIGH (ref 1.7–7.7)
Neutrophils Relative %: 90 % — ABNORMAL HIGH (ref 43–77)
PLATELETS: 294 10*3/uL (ref 150–400)
RBC: 4.15 MIL/uL (ref 3.87–5.11)
RDW: 13.7 % (ref 11.5–15.5)
WBC: 11 10*3/uL — ABNORMAL HIGH (ref 4.0–10.5)

## 2013-11-28 LAB — LIPASE, BLOOD: LIPASE: 23 U/L (ref 11–59)

## 2013-11-28 MED ORDER — ONDANSETRON 8 MG PO TBDP: 8.0000 mg | ORAL_TABLET | Freq: Three times a day (TID) | ORAL | Status: DC | PRN

## 2013-11-28 MED ORDER — SODIUM CHLORIDE 0.9 % IV BOLUS (SEPSIS)
2000.0000 mL | Freq: Once | INTRAVENOUS | Status: AC
  Administered 2013-11-28: 1000 mL via INTRAVENOUS

## 2013-11-28 MED ORDER — ACETAMINOPHEN 500 MG PO TABS
1000.0000 mg | ORAL_TABLET | Freq: Once | ORAL | Status: AC
  Administered 2013-11-28: 1000 mg via ORAL
  Filled 2013-11-28: qty 2

## 2013-11-28 MED ORDER — ONDANSETRON HCL 4 MG/2ML IJ SOLN
4.0000 mg | Freq: Once | INTRAMUSCULAR | Status: AC
  Administered 2013-11-28: 4 mg via INTRAVENOUS
  Filled 2013-11-28: qty 2

## 2013-11-28 NOTE — ED Provider Notes (Signed)
CSN: [id number]     Arrival date & time 11/28/13  1932 History   First MD Initiated Contact with Patient 11/28/13 2153     Chief Complaint  Patient presents with  . Abdominal Pain     (Consider location/radiation/quality/duration/timing/severity/associated sxs/prior Treatment) HPI Comments: 43 year old female presents with 6 hours of nausea, vomiting, and diarrhea. She's had some cramping abdominal pain as well. She feels like she is also developing a headache. This all started immediately after taking a nap. She states a couple hours prior to this she had some barbecue. She's not had any fevers, hematochezia, hematemesis, recent antibiotics, or recent travel. Her pain is about a 5/10. Denies any urinary symptoms. Has not been able to keep any fluids down and she is vomiting with by mouth attempts so she came to the ER. When she's not trying to take fluids she's not nauseous or vomiting.   Past Medical History  Diagnosis Date  . Hypertension    Past Surgical History  Procedure Laterality Date  . Tubal ligation    . Cholecystectomy     Family History  Problem Relation Age of Onset  . Stomach cancer Mother   . Cancer Mother   . Prostate cancer Father   . Cancer Father   . Breast cancer Maternal Aunt   . Breast cancer Paternal Aunt    History  Substance Use Topics  . Smoking status: Never Smoker   . Smokeless tobacco: Never Used  . Alcohol Use: No   OB History   Grav Para Term Preterm Abortions TAB SAB Ect Mult Living                 Review of Systems  Constitutional: Negative for fever.  Gastrointestinal: Positive for nausea, vomiting, abdominal pain and diarrhea. Negative for blood in stool.  Genitourinary: Negative for dysuria.  All other systems reviewed and are negative.      Allergies  Review of patient's allergies indicates no known allergies.  Home Medications   Current Outpatient Rx  Name  Route  Sig  Dispense  Refill  . lisinopril-hydrochlorothiazide  (PRINZIDE,ZESTORETIC) 20-12.5 MG per tablet   Oral   Take 2 tablets by mouth every morning.          . metroNIDAZOLE (FLAGYL) 500 MG tablet   Oral   Take 500 mg by mouth daily. For 7 days         . norethindrone-ethinyl estradiol-iron (LOESTRIN FE 1.5/30) 1.5-30 MG-MCG tablet   Oral   Take 1 tablet by mouth daily.   1 Package   2   . sulfamethoxazole-trimethoprim (BACTRIM DS) 800-160 MG per tablet   Oral   Take 1 tablet by mouth daily. For 7 days          BP 114/72  Pulse 118  Temp(Src) 98.3 F (36.8 C) (Oral)  Resp 18  Ht 5\' 4"  (1.626 m)  Wt 213 lb (96.616 kg)  BMI 36.54 kg/m2  SpO2 100%  LMP 11/08/2013 Physical Exam  Nursing note and vitals reviewed. Constitutional: She is oriented to person, place, and time. She appears well-developed and well-nourished. No distress.  HENT:  Head: Normocephalic and atraumatic.  Right Ear: External ear normal.  Left Ear: External ear normal.  Nose: Nose normal.  Eyes: Right eye exhibits no discharge. Left eye exhibits no discharge.  Cardiovascular: Regular rhythm and normal heart sounds.  Tachycardia present.   Pulmonary/Chest: Effort normal and breath sounds normal.  Abdominal: Soft. She exhibits no distension. There is  no tenderness.  Neurological: She is alert and oriented to person, place, and time.  Skin: Skin is warm and dry.    ED Course  Procedures (including critical care time) Labs Review Labs Reviewed  CBC WITH DIFFERENTIAL - Abnormal; Notable for the following:    WBC 11.0 (*)    Hemoglobin 11.3 (*)    HCT 33.6 (*)    Neutrophils Relative % 90 (*)    Neutro Abs 10.0 (*)    Lymphocytes Relative 6 (*)    Lymphs Abs 0.6 (*)    All other components within normal limits  COMPREHENSIVE METABOLIC PANEL - Abnormal; Notable for the following:    Potassium 3.4 (*)    Chloride 95 (*)    Glucose, Bld 127 (*)    Albumin 3.3 (*)    GFR calc non Af Amer 64 (*)    GFR calc Af Amer 74 (*)    All other components  within normal limits  URINALYSIS, ROUTINE W REFLEX MICROSCOPIC - Abnormal; Notable for the following:    Leukocytes, UA SMALL (*)    All other components within normal limits  LIPASE, BLOOD  URINE MICROSCOPIC-ADD ON  PREGNANCY, URINE  POC URINE PREG, ED   Imaging Review No results found.   EKG Interpretation None      MDM   Final diagnoses:  Gastroenteritis    Patient symptoms are consistent with viral gastroenteritis. Due to her tachycardia, labs were evaluated and she was given IV fluids and IV Zofran. She feels significantly improved with this. There no red flags, such as travel history or recent antibiotics. I feel at this time she be managed symptomatically as an outpatient. Her tachycardia improved. I will discharge with Zofran and return precautions.    Ephraim Hamburger, MD 11/29/13 [phone]

## 2013-11-28 NOTE — ED Notes (Signed)
Pt states that she has body aches, nausea, vomiting and abd pain that began around 4pm; pt unable to states how many times she has vomited or had diarrhea; pt sitting in triage chair in no acute distress.

## 2013-11-28 NOTE — ED Notes (Signed)
x2 bolus started at this time

## 2013-11-29 LAB — POC URINE PREG, ED: PREG TEST UR: NEGATIVE

## 2013-11-29 LAB — PREGNANCY, URINE: Preg Test, Ur: NEGATIVE

## 2013-11-29 NOTE — ED Notes (Signed)
Pt given Sprite 

## 2014-01-01 ENCOUNTER — Other Ambulatory Visit: Payer: Self-pay | Admitting: Obstetrics and Gynecology

## 2014-03-08 ENCOUNTER — Emergency Department (HOSPITAL_COMMUNITY): Payer: BC Managed Care – PPO

## 2014-03-08 ENCOUNTER — Encounter (HOSPITAL_COMMUNITY): Payer: Self-pay | Admitting: Emergency Medicine

## 2014-03-08 ENCOUNTER — Emergency Department (HOSPITAL_COMMUNITY)
Admission: EM | Admit: 2014-03-08 | Discharge: 2014-03-08 | Disposition: A | Discharge status: Home / Self Care | Payer: BC Managed Care – PPO | Attending: Emergency Medicine | Admitting: Emergency Medicine

## 2014-03-08 DIAGNOSIS — I10 Essential (primary) hypertension: Secondary | ICD-10-CM | POA: Insufficient documentation

## 2014-03-08 DIAGNOSIS — R109 Unspecified abdominal pain: Secondary | ICD-10-CM

## 2014-03-08 DIAGNOSIS — R1110 Vomiting, unspecified: Secondary | ICD-10-CM | POA: Insufficient documentation

## 2014-03-08 DIAGNOSIS — G8918 Other acute postprocedural pain: Secondary | ICD-10-CM | POA: Insufficient documentation

## 2014-03-08 DIAGNOSIS — R197 Diarrhea, unspecified: Secondary | ICD-10-CM | POA: Insufficient documentation

## 2014-03-08 DIAGNOSIS — R1013 Epigastric pain: Secondary | ICD-10-CM | POA: Insufficient documentation

## 2014-03-08 DIAGNOSIS — Z3202 Encounter for pregnancy test, result negative: Secondary | ICD-10-CM | POA: Insufficient documentation

## 2014-03-08 DIAGNOSIS — Z9089 Acquired absence of other organs: Secondary | ICD-10-CM | POA: Insufficient documentation

## 2014-03-08 DIAGNOSIS — Z79899 Other long term (current) drug therapy: Secondary | ICD-10-CM | POA: Insufficient documentation

## 2014-03-08 DIAGNOSIS — Z9851 Tubal ligation status: Secondary | ICD-10-CM | POA: Insufficient documentation

## 2014-03-08 LAB — URINE MICROSCOPIC-ADD ON

## 2014-03-08 LAB — CBC WITH DIFFERENTIAL/PLATELET
BASOS ABS: 0 10*3/uL (ref 0.0–0.1)
Basophils Relative: 0 % (ref 0–1)
EOS ABS: 0.2 10*3/uL (ref 0.0–0.7)
EOS PCT: 2 % (ref 0–5)
HCT: 36 % (ref 36.0–46.0)
Hemoglobin: 12.3 g/dL (ref 12.0–15.0)
Lymphocytes Relative: 35 % (ref 12–46)
Lymphs Abs: 2.6 10*3/uL (ref 0.7–4.0)
MCH: 28.5 pg (ref 26.0–34.0)
MCHC: 34.2 g/dL (ref 30.0–36.0)
MCV: 83.3 fL (ref 78.0–100.0)
Monocytes Absolute: 0.5 10*3/uL (ref 0.1–1.0)
Monocytes Relative: 7 % (ref 3–12)
Neutro Abs: 4.3 10*3/uL (ref 1.7–7.7)
Neutrophils Relative %: 56 % (ref 43–77)
Platelets: 397 10*3/uL (ref 150–400)
RBC: 4.32 MIL/uL (ref 3.87–5.11)
RDW: 13.8 % (ref 11.5–15.5)
WBC: 7.6 10*3/uL (ref 4.0–10.5)

## 2014-03-08 LAB — COMPREHENSIVE METABOLIC PANEL
ALT: 6 U/L (ref 0–35)
AST: 11 U/L (ref 0–37)
Albumin: 3.4 g/dL — ABNORMAL LOW (ref 3.5–5.2)
Alkaline Phosphatase: 46 U/L (ref 39–117)
BUN: 15 mg/dL (ref 6–23)
CALCIUM: 9.3 mg/dL (ref 8.4–10.5)
CO2: 31 mEq/L (ref 19–32)
CREATININE: 1.01 mg/dL (ref 0.50–1.10)
Chloride: 99 mEq/L (ref 96–112)
GFR calc Af Amer: 78 mL/min — ABNORMAL LOW (ref >90)
GFR calc non Af Amer: 67 mL/min — ABNORMAL LOW (ref >90)
Glucose, Bld: 168 mg/dL — ABNORMAL HIGH (ref 70–99)
Potassium: 3.7 mEq/L (ref 3.7–5.3)
SODIUM: 139 meq/L (ref 137–147)
Total Bilirubin: 0.5 mg/dL (ref 0.3–1.2)
Total Protein: 8.2 g/dL (ref 6.0–8.3)

## 2014-03-08 LAB — URINALYSIS, ROUTINE W REFLEX MICROSCOPIC
BILIRUBIN URINE: NEGATIVE
GLUCOSE, UA: NEGATIVE mg/dL
HGB URINE DIPSTICK: NEGATIVE
Ketones, ur: NEGATIVE mg/dL
Nitrite: NEGATIVE
Protein, ur: 30 mg/dL — AB
SPECIFIC GRAVITY, URINE: 1.028 (ref 1.005–1.030)
Urobilinogen, UA: 0.2 mg/dL (ref 0.0–1.0)
pH: 5.5 (ref 5.0–8.0)

## 2014-03-08 LAB — POC URINE PREG, ED: Preg Test, Ur: NEGATIVE

## 2014-03-08 LAB — LIPASE, BLOOD: Lipase: 19 U/L (ref 11–59)

## 2014-03-08 MED ORDER — CIPROFLOXACIN HCL 500 MG PO TABS: 500.0000 mg | ORAL_TABLET | Freq: Two times a day (BID) | ORAL | Status: DC

## 2014-03-08 MED ORDER — ONDANSETRON 8 MG PO TBDP: 8.0000 mg | ORAL_TABLET | Freq: Three times a day (TID) | ORAL | Status: DC | PRN

## 2014-03-08 MED ORDER — ONDANSETRON HCL 4 MG/2ML IJ SOLN
4.0000 mg | Freq: Once | INTRAMUSCULAR | Status: AC
  Administered 2014-03-08: 4 mg via INTRAVENOUS
  Filled 2014-03-08: qty 2

## 2014-03-08 MED ORDER — METRONIDAZOLE 500 MG PO TABS: 500.0000 mg | ORAL_TABLET | Freq: Two times a day (BID) | ORAL | Status: DC

## 2014-03-08 MED ORDER — SODIUM CHLORIDE 0.9 % IV SOLN
INTRAVENOUS | Status: DC
Start: 2014-03-08 — End: 2014-03-08

## 2014-03-08 MED ORDER — MORPHINE SULFATE 4 MG/ML IJ SOLN
4.0000 mg | Freq: Once | INTRAMUSCULAR | Status: AC
  Administered 2014-03-08: 4 mg via INTRAVENOUS
  Filled 2014-03-08: qty 1

## 2014-03-08 MED ORDER — SODIUM CHLORIDE 0.9 % IV BOLUS (SEPSIS)
1000.0000 mL | Freq: Once | INTRAVENOUS | Status: AC
  Administered 2014-03-08: 1000 mL via INTRAVENOUS

## 2014-03-08 NOTE — Discharge Instructions (Signed)
Only start the antibiotics if you feel worse. Followup with your gastroenterologist Abdominal Pain, Adult Many things can cause abdominal pain. Usually, abdominal pain is not caused by a disease and will improve without treatment. It can often be observed and treated at home. Your health care provider will do a physical exam and possibly order blood tests and X-rays to help determine the seriousness of your pain. However, in many cases, more time must pass before a clear cause of the pain can be found. Before that point, your health care provider may not know if you need more testing or further treatment. HOME CARE INSTRUCTIONS  Monitor your abdominal pain for any changes. The following actions may help to alleviate any discomfort you are experiencing:  Only take over-the-counter or prescription medicines as directed by your health care provider.  Do not take laxatives unless directed to do so by your health care provider.  Try a clear liquid diet (broth, tea, or water) as directed by your health care provider. Slowly move to a bland diet as tolerated. SEEK MEDICAL CARE IF:  You have unexplained abdominal pain.  You have abdominal pain associated with nausea or diarrhea.  You have pain when you urinate or have a bowel movement.  You experience abdominal pain that wakes you in the night.  You have abdominal pain that is worsened or improved by eating food.  You have abdominal pain that is worsened with eating fatty foods. SEEK IMMEDIATE MEDICAL CARE IF:   Your pain does not go away within 2 hours.  You have a fever.  You keep throwing up (vomiting).  Your pain is felt only in portions of the abdomen, such as the right side or the left lower portion of the abdomen.  You pass bloody or black tarry stools. MAKE SURE YOU:  Understand these instructions.   Will watch your condition.   Will get help right away if you are not doing well or get worse.  Document Released:  06/23/2005 Document Revised: 07/04/2013 Document Reviewed: 05/23/2013 [facility] Patient Information 2014 [employer].

## 2014-03-08 NOTE — ED Provider Notes (Signed)
CSN: [id number]     Arrival date & time 03/08/14  0704 History   First MD Initiated Contact with Patient 03/08/14 [phone]     Chief Complaint  Patient presents with  . Abdominal Pain  . Diarrhea  . Emesis     (Consider location/radiation/quality/duration/timing/severity/associated sxs/prior Treatment) Patient is a 43 y.o. female presenting with abdominal pain, diarrhea, and vomiting. The history is provided by the patient.  Abdominal Pain Pain location:  Epigastric Pain quality: sharp   Pain radiates to:  Does not radiate Pain severity:  Moderate Onset quality:  Sudden Duration:  4 days Timing:  Constant Progression:  Worsening Chronicity:  Recurrent Context: not alcohol use, not eating and not suspicious food intake   Relieved by:  Nothing Worsened by:  Nothing tried Associated symptoms: diarrhea and vomiting   Associated symptoms: no dysuria, no fever, no vaginal bleeding and no vaginal discharge   Diarrhea Quality:  Watery Severity:  Moderate Onset quality:  Sudden Duration:  4 days Timing:  Constant Progression:  Improving Relieved by:  Nothing Worsened by:  Nothing tried Associated symptoms: abdominal pain and vomiting   Associated symptoms: no fever   Emesis Associated symptoms: abdominal pain and diarrhea   h/o enteritis 2 years ago and this is similar--took hydrocodone without relief  Past Medical History  Diagnosis Date  . Hypertension    Past Surgical History  Procedure Laterality Date  . Tubal ligation    . Cholecystectomy     Family History  Problem Relation Age of Onset  . Stomach cancer Mother   . Cancer Mother   . Prostate cancer Father   . Cancer Father   . Breast cancer Maternal Aunt   . Breast cancer Paternal Aunt    History  Substance Use Topics  . Smoking status: Never Smoker   . Smokeless tobacco: Never Used  . Alcohol Use: No   OB History   Grav Para Term Preterm Abortions TAB SAB Ect Mult Living                 Review of  Systems  Constitutional: Negative for fever.  Gastrointestinal: Positive for vomiting, abdominal pain and diarrhea.  Genitourinary: Negative for dysuria, vaginal bleeding and vaginal discharge.  All other systems reviewed and are negative.     Allergies  Review of patient's allergies indicates no known allergies.  Home Medications   Prior to Admission medications   Medication Sig Start Date End Date Taking? Authorizing Provider  lisinopril-hydrochlorothiazide (PRINZIDE,ZESTORETIC) 20-12.5 MG per tablet Take 2 tablets by mouth every morning.     Historical Provider, MD  metroNIDAZOLE (FLAGYL) 500 MG tablet Take 500 mg by mouth daily. For 7 days 11/19/13   Historical Provider, MD  norethindrone-ethinyl estradiol-iron (LOESTRIN FE 1.5/30) 1.5-30 MG-MCG tablet Take 1 tablet by mouth daily. 05/11/13   Angelica Chessman, MD  ondansetron (ZOFRAN ODT) 8 MG disintegrating tablet Take 1 tablet (8 mg total) by mouth every 8 (eight) hours as needed for nausea or vomiting. 11/28/13   Ephraim Hamburger, MD  sulfamethoxazole-trimethoprim (BACTRIM DS) 800-160 MG per tablet Take 1 tablet by mouth daily. For 7 days 11/19/13   Historical Provider, MD   BP 133/80  Pulse 86  Temp(Src) 98.3 F (36.8 C) (Oral)  Resp 16  Ht 5\' 4"  (1.626 m)  Wt 205 lb (92.987 kg)  BMI 35.17 kg/m2  SpO2 99%  LMP 02/28/2014 Physical Exam  Nursing note and vitals reviewed. Constitutional: She is oriented to person, place, and time.  She appears well-developed and well-nourished.  Non-toxic appearance. No distress.  HENT:  Head: Normocephalic and atraumatic.  Eyes: Conjunctivae, EOM and lids are normal. Pupils are equal, round, and reactive to light.  Neck: Normal range of motion. Neck supple. No tracheal deviation present. No mass present.  Cardiovascular: Normal rate, regular rhythm and normal heart sounds.  Exam reveals no gallop.   No murmur heard. Pulmonary/Chest: Effort normal and breath sounds normal. No stridor. No  respiratory distress. She has no decreased breath sounds. She has no wheezes. She has no rhonchi. She has no rales.  Abdominal: Soft. Normal appearance and bowel sounds are normal. She exhibits no distension. There is tenderness in the epigastric area. There is no rigidity, no rebound, no guarding and no CVA tenderness.  Musculoskeletal: Normal range of motion. She exhibits no edema and no tenderness.  Neurological: She is alert and oriented to person, place, and time. She has normal strength. No cranial nerve deficit or sensory deficit. GCS eye subscore is 4. GCS verbal subscore is 5. GCS motor subscore is 6.  Skin: Skin is warm and dry. No abrasion and no rash noted.  Psychiatric: She has a normal mood and affect. Her speech is normal and behavior is normal.    ED Course  Procedures (including critical care time) Labs Review Labs Reviewed  CBC WITH DIFFERENTIAL  COMPREHENSIVE METABOLIC PANEL  LIPASE, BLOOD  URINALYSIS, ROUTINE W REFLEX MICROSCOPIC  POC URINE PREG, ED    Imaging Review No results found.   EKG Interpretation None      MDM   Final diagnoses:  None    Patient given IV fluids and medications here feels better. No evidence of a surgical abdomen at this time.  Mild constipation noted on x-ray. Patient given prescription for Cipro and Flagyl and instructed to only take this if she gets worse. She is to followup with her gastroenterologist.  Leota Jacobsen, MD 03/08/14 [phone]

## 2014-03-08 NOTE — ED Notes (Signed)
Pt states she has not felt well for the past three days  Pt states she has abd pain and has had diarrhea for three days and vomiting for two days

## 2014-03-08 NOTE — ED Notes (Signed)
Patient to xray.

## 2014-03-10 ENCOUNTER — Encounter (HOSPITAL_COMMUNITY): Payer: Self-pay | Admitting: Emergency Medicine

## 2014-03-10 ENCOUNTER — Emergency Department (HOSPITAL_COMMUNITY)
Admission: EM | Admit: 2014-03-10 | Discharge: 2014-03-11 | Disposition: A | Discharge status: Home / Self Care | Payer: BC Managed Care – PPO | Attending: Emergency Medicine | Admitting: Emergency Medicine

## 2014-03-10 DIAGNOSIS — Z792 Long term (current) use of antibiotics: Secondary | ICD-10-CM | POA: Insufficient documentation

## 2014-03-10 DIAGNOSIS — R109 Unspecified abdominal pain: Secondary | ICD-10-CM

## 2014-03-10 DIAGNOSIS — I10 Essential (primary) hypertension: Secondary | ICD-10-CM | POA: Insufficient documentation

## 2014-03-10 DIAGNOSIS — Z79899 Other long term (current) drug therapy: Secondary | ICD-10-CM | POA: Insufficient documentation

## 2014-03-10 DIAGNOSIS — L539 Erythematous condition, unspecified: Secondary | ICD-10-CM | POA: Insufficient documentation

## 2014-03-10 DIAGNOSIS — N390 Urinary tract infection, site not specified: Secondary | ICD-10-CM

## 2014-03-10 LAB — COMPREHENSIVE METABOLIC PANEL
ALBUMIN: 3 g/dL — AB (ref 3.5–5.2)
ALK PHOS: 44 U/L (ref 39–117)
ALT: 6 U/L (ref 0–35)
AST: 13 U/L (ref 0–37)
BILIRUBIN TOTAL: 0.3 mg/dL (ref 0.3–1.2)
BUN: 10 mg/dL (ref 6–23)
CHLORIDE: 98 meq/L (ref 96–112)
CO2: 26 mEq/L (ref 19–32)
CREATININE: 0.82 mg/dL (ref 0.50–1.10)
Calcium: 8.7 mg/dL (ref 8.4–10.5)
GFR calc Af Amer: >90 mL/min (ref >90)
GFR calc non Af Amer: 86 mL/min — ABNORMAL LOW (ref >90)
Glucose, Bld: 118 mg/dL — ABNORMAL HIGH (ref 70–99)
POTASSIUM: 3.4 meq/L — AB (ref 3.7–5.3)
Sodium: 136 mEq/L — ABNORMAL LOW (ref 137–147)
Total Protein: 7.2 g/dL (ref 6.0–8.3)

## 2014-03-10 LAB — CBC WITH DIFFERENTIAL/PLATELET
BASOS ABS: 0 10*3/uL (ref 0.0–0.1)
Basophils Relative: 0 % (ref 0–1)
Eosinophils Absolute: 0.2 10*3/uL (ref 0.0–0.7)
Eosinophils Relative: 1 % (ref 0–5)
HCT: 34.1 % — ABNORMAL LOW (ref 36.0–46.0)
Hemoglobin: 11.5 g/dL — ABNORMAL LOW (ref 12.0–15.0)
LYMPHS PCT: 5 % — AB (ref 12–46)
Lymphs Abs: 0.7 10*3/uL (ref 0.7–4.0)
MCH: 28 pg (ref 26.0–34.0)
MCHC: 33.7 g/dL (ref 30.0–36.0)
MCV: 83 fL (ref 78.0–100.0)
Monocytes Absolute: 1 10*3/uL (ref 0.1–1.0)
Monocytes Relative: 7 % (ref 3–12)
NEUTROS ABS: 12.5 10*3/uL — AB (ref 1.7–7.7)
Neutrophils Relative %: 87 % — ABNORMAL HIGH (ref 43–77)
PLATELETS: 296 10*3/uL (ref 150–400)
RBC: 4.11 MIL/uL (ref 3.87–5.11)
RDW: 13.8 % (ref 11.5–15.5)
WBC: 14.4 10*3/uL — AB (ref 4.0–10.5)

## 2014-03-10 LAB — LIPASE, BLOOD: Lipase: 19 U/L (ref 11–59)

## 2014-03-10 MED ORDER — PANTOPRAZOLE SODIUM 40 MG IV SOLR
40.0000 mg | Freq: Once | INTRAVENOUS | Status: AC
  Administered 2014-03-10: 40 mg via INTRAVENOUS
  Filled 2014-03-10: qty 40

## 2014-03-10 MED ORDER — FENTANYL CITRATE 0.05 MG/ML IJ SOLN
50.0000 ug | Freq: Once | INTRAMUSCULAR | Status: AC
  Administered 2014-03-10: 50 ug via INTRAVENOUS
  Filled 2014-03-10: qty 2

## 2014-03-10 MED ORDER — ONDANSETRON HCL 4 MG/2ML IJ SOLN
4.0000 mg | Freq: Once | INTRAMUSCULAR | Status: AC
  Administered 2014-03-10: 4 mg via INTRAVENOUS
  Filled 2014-03-10: qty 2

## 2014-03-10 MED ORDER — DICYCLOMINE HCL 20 MG PO TABS
20.0000 mg | ORAL_TABLET | Freq: Once | ORAL | Status: AC
  Administered 2014-03-10: 20 mg via ORAL
  Filled 2014-03-10: qty 1

## 2014-03-10 NOTE — ED Notes (Signed)
Pt arrived to the ED with a complaint of abdominal pain.  Pain is located in the upper abdominal area, radiating to the mid back area.  Pt was seen on Friday and told she needed to have a bowel movement.  Pt has had a bowel movement but that has not relieved her symptoms.  Pt now states she is constantly cold.

## 2014-03-11 ENCOUNTER — Emergency Department (HOSPITAL_COMMUNITY): Payer: BC Managed Care – PPO

## 2014-03-11 LAB — URINALYSIS, ROUTINE W REFLEX MICROSCOPIC
Bilirubin Urine: NEGATIVE
GLUCOSE, UA: NEGATIVE mg/dL
HGB URINE DIPSTICK: NEGATIVE
KETONES UR: NEGATIVE mg/dL
Nitrite: NEGATIVE
PH: 8 (ref 5.0–8.0)
Protein, ur: 30 mg/dL — AB
Specific Gravity, Urine: 1.023 (ref 1.005–1.030)
Urobilinogen, UA: 0.2 mg/dL (ref 0.0–1.0)

## 2014-03-11 LAB — URINE MICROSCOPIC-ADD ON

## 2014-03-11 MED ORDER — IOHEXOL 300 MG/ML  SOLN
50.0000 mL | Freq: Once | INTRAMUSCULAR | Status: AC | PRN
  Administered 2014-03-11: 50 mL via ORAL

## 2014-03-11 MED ORDER — DICYCLOMINE HCL 20 MG PO TABS: 20.0000 mg | ORAL_TABLET | Freq: Four times a day (QID) | ORAL | Status: DC | PRN

## 2014-03-11 MED ORDER — IOHEXOL 300 MG/ML  SOLN
100.0000 mL | Freq: Once | INTRAMUSCULAR | Status: AC | PRN
  Administered 2014-03-11: 100 mL via INTRAVENOUS

## 2014-03-11 MED ORDER — FENTANYL CITRATE 0.05 MG/ML IJ SOLN
50.0000 ug | Freq: Once | INTRAMUSCULAR | Status: AC
  Administered 2014-03-11: 50 ug via INTRAVENOUS
  Filled 2014-03-11: qty 2

## 2014-03-11 MED ORDER — ONDANSETRON 8 MG PO TBDP: 8.0000 mg | ORAL_TABLET | Freq: Three times a day (TID) | ORAL | Status: DC | PRN

## 2014-03-11 NOTE — ED Provider Notes (Signed)
CSN: [id number]     Arrival date & time 03/10/14  2209 History   First MD Initiated Contact with Patient 03/10/14 2307     Chief Complaint  Patient presents with  . Abdominal Pain     (Consider location/radiation/quality/duration/timing/severity/associated sxs/prior Treatment) HPI 43 year old female presents to the emergency apartment with complaint of worsening abdominal pain.  Patient was seen in the emergency department on Friday, told that she had constipation on x-ray and given prescriptions for Cipro and Flagyl if symptoms did not improve.  She reports she began to feel ill on Wednesday with diarrhea and cramping pain.  Saturday night she started back with cramping. Patient started taking her Cipro and Flagyl this morning, has taken 2 doses.  Tonight had nausea and vomiting and worsening upper abdominal pain.  No fevers or chills.  No unusual foods, no travel, no sick contacts.  Patient has history of gastritis, she is status post cholecystectomy.  Patient has prior history of enteritis on CT scan requiring admission.  Patient reports she's been taking MiraLAX and milk of magnesia to help with constipation.  Patient reports she has had some increased urinary frequency but no pain with urination  Past Medical History  Diagnosis Date  . Hypertension    Past Surgical History  Procedure Laterality Date  . Tubal ligation    . Cholecystectomy     Family History  Problem Relation Age of Onset  . Stomach cancer Mother   . Cancer Mother   . Prostate cancer Father   . Cancer Father   . Breast cancer Maternal Aunt   . Breast cancer Paternal Aunt    History  Substance Use Topics  . Smoking status: Never Smoker   . Smokeless tobacco: Never Used  . Alcohol Use: No   OB History   Grav Para Term Preterm Abortions TAB SAB Ect Mult Living                 Review of Systems   See History of Present Illness; otherwise all other systems are reviewed and negative  Allergies  Review of  patient's allergies indicates no known allergies.  Home Medications   Prior to Admission medications   Medication Sig Start Date End Date Taking? Authorizing Provider  ciprofloxacin (CIPRO) 500 MG tablet Take 500 mg by mouth 2 (two) times daily. 7 day therapy course patient has completed day 1 03/08/14 03/15/14 Yes Leota Jacobsen, MD  ibuprofen (ADVIL,MOTRIN) 200 MG tablet Take 400 mg by mouth every 6 (six) hours as needed for moderate pain.   Yes Historical Provider, MD  lisinopril-hydrochlorothiazide (PRINZIDE,ZESTORETIC) 20-12.5 MG per tablet Take 2 tablets by mouth every morning.    Yes Historical Provider, MD  metroNIDAZOLE (FLAGYL) 500 MG tablet Take 500 mg by mouth 2 (two) times daily. 7 day therapy course patient has completed day 1 03/08/14  Yes Leota Jacobsen, MD  norethindrone-ethinyl estradiol-iron (LOESTRIN FE 1.5/30) 1.5-30 MG-MCG tablet Take 1 tablet by mouth daily. 05/11/13  Yes Angelica Chessman, MD   BP 124/63  Pulse 93  Temp(Src) 98.3 F (36.8 C) (Oral)  Resp 18  SpO2 100%  LMP 02/28/2014 Physical Exam  Nursing note and vitals reviewed. Constitutional: She is oriented to person, place, and time. She appears well-developed and well-nourished.  HENT:  Head: Normocephalic and atraumatic.  Right Ear: External ear normal.  Left Ear: External ear normal.  Nose: Nose normal.  Mouth/Throat: Oropharynx is clear and moist.  Eyes: Conjunctivae and EOM are normal. Pupils  are equal, round, and reactive to light.  Neck: Normal range of motion. Neck supple. No JVD present. No tracheal deviation present. No thyromegaly present.  Cardiovascular: Normal rate, regular rhythm, normal heart sounds and intact distal pulses.  Exam reveals no gallop and no friction rub.   No murmur heard. Pulmonary/Chest: Effort normal and breath sounds normal. No stridor. No respiratory distress. She has no wheezes. She has no rales. She exhibits no tenderness.  Abdominal: Soft. She exhibits no distension  and no mass. There is tenderness (diffuse tenderness , mainly in the upper abdomen). There is no rebound and no guarding.  Hyperactive bowel sounds  Musculoskeletal: Normal range of motion. She exhibits no edema and no tenderness.  Lymphadenopathy:    She has no cervical adenopathy.  Neurological: She is alert and oriented to person, place, and time. She exhibits normal muscle tone. Coordination normal.  Skin: Skin is dry. No rash noted. There is erythema. No pallor.  Psychiatric: She has a normal mood and affect. Her behavior is normal. Judgment and thought content normal.    ED Course  Procedures (including critical care time) Labs Review Labs Reviewed  CBC WITH DIFFERENTIAL - Abnormal; Notable for the following:    WBC 14.4 (*)    Hemoglobin 11.5 (*)    HCT 34.1 (*)    Neutrophils Relative % 87 (*)    Neutro Abs 12.5 (*)    Lymphocytes Relative 5 (*)    All other components within normal limits  COMPREHENSIVE METABOLIC PANEL - Abnormal; Notable for the following:    Sodium 136 (*)    Potassium 3.4 (*)    Glucose, Bld 118 (*)    Albumin 3.0 (*)    GFR calc non Af Amer 86 (*)    All other components within normal limits  URINALYSIS, ROUTINE W REFLEX MICROSCOPIC - Abnormal; Notable for the following:    APPearance CLOUDY (*)    Protein, ur 30 (*)    Leukocytes, UA TRACE (*)    All other components within normal limits  URINE MICROSCOPIC-ADD ON - Abnormal; Notable for the following:    Squamous Epithelial / LPF MANY (*)    Bacteria, UA MANY (*)    All other components within normal limits  URINE CULTURE  LIPASE, BLOOD    Imaging Review Ct Abdomen Pelvis W Contrast  03/11/2014   CLINICAL DATA:  Worsening upper abdominal pain, radiating to the back. Leukocytosis. White blood cells in the urine.  EXAM: CT ABDOMEN AND PELVIS WITH CONTRAST  TECHNIQUE: Multidetector CT imaging of the abdomen and pelvis was performed using the standard protocol following bolus administration of  intravenous contrast.  CONTRAST:  162mL OMNIPAQUE IOHEXOL 300 MG/ML  SOLN  COMPARISON:  CT of the abdomen and pelvis performed 06/13/2012, and abdominal ultrasound performed 11/05/2012  FINDINGS: The visualized lung bases are clear.  The liver and spleen are unremarkable in appearance. The patient is status post cholecystectomy, with clips noted along the gallbladder fossa. The pancreas and adrenal glands are unremarkable.  There is no evidence of hydronephrosis. No renal or ureteral stones are seen. Note is made of diffuse wall thickening along the proximal and mid right ureter, raising concern for ureteritis. There is incomplete rotation of the right kidney. No perinephric stranding is seen.  No free fluid is identified. The small bowel is unremarkable in appearance. The stomach is within normal limits. No acute vascular abnormalities are seen.  The appendix is normal in caliber, without evidence for appendicitis. The  colon is unremarkable in appearance.  The bladder is mildly distended; mild bladder wall thickening may reflect cystitis, given the patient's symptoms. A few fibroids are noted within the uterus, one of which demonstrates peripheral calcification. The ovaries are relatively symmetric; no suspicious adnexal masses are seen. No inguinal lymphadenopathy is seen.  No acute osseous abnormalities are identified.  IMPRESSION: 1. Diffuse wall thickening along the proximal and mid right ureter, raising concern for ureteritis. 2. Mild bladder wall thickening may reflect underlying cystitis, given the patient's symptoms. 3. No evidence of hydronephrosis; no definite evidence of pyelonephritis at this time. 4. Fibroid uterus noted.   Electronically Signed   By: Garald Balding M.D.   On: 03/11/2014 02:09     EKG Interpretation None      MDM   Final diagnoses:  Urinary tract infection  Abdominal pain    43 year old female with persistent and worsening abdominal pain.  Plan for labs, treatment of  pain and nausea.   Patient has had a significant elevation in her white blood cell count from her most recent visit.  Plan for CT abdomen pelvis.    Kalman Drape, MD 03/11/14 [phone]

## 2014-03-11 NOTE — Discharge Instructions (Signed)
Continue taking medications for constipation until you're having regular soft bowel movements.  Your CAT scan today showed some inflammation around your bladder and ureter, the tube that runs from your kidney to your bladder.  Continue taking the Cipro as this will help with any infection associated with this inflammation.  You may stop taking the Flagyl.  Takes Zofran and Bentyl as needed for nausea and abdominal cramping.  Followup with your Dr. for recheck within a week.  Return to the emergency apartment for fever, worsening pain nausea vomiting despite medication, or other new concerning symptoms.   Abdominal Pain, Adult Many things can cause abdominal pain. Usually, abdominal pain is not caused by a disease and will improve without treatment. It can often be observed and treated at home. Your health care provider will do a physical exam and possibly order blood tests and X-rays to help determine the seriousness of your pain. However, in many cases, more time must pass before a clear cause of the pain can be found. Before that point, your health care provider may not know if you need more testing or further treatment. HOME CARE INSTRUCTIONS  Monitor your abdominal pain for any changes. The following actions may help to alleviate any discomfort you are experiencing:  Only take over-the-counter or prescription medicines as directed by your health care provider.  Do not take laxatives unless directed to do so by your health care provider.  Try a clear liquid diet (broth, tea, or water) as directed by your health care provider. Slowly move to a bland diet as tolerated. SEEK MEDICAL CARE IF:  You have unexplained abdominal pain.  You have abdominal pain associated with nausea or diarrhea.  You have pain when you urinate or have a bowel movement.  You experience abdominal pain that wakes you in the night.  You have abdominal pain that is worsened or improved by eating food.  You have  abdominal pain that is worsened with eating fatty foods. SEEK IMMEDIATE MEDICAL CARE IF:   Your pain does not go away within 2 hours.  You have a fever.  You keep throwing up (vomiting).  Your pain is felt only in portions of the abdomen, such as the right side or the left lower portion of the abdomen.  You pass bloody or black tarry stools. MAKE SURE YOU:  Understand these instructions.   Will watch your condition.   Will get help right away if you are not doing well or get worse.  Document Released: 06/23/2005 Document Revised: 07/04/2013 Document Reviewed: 05/23/2013 [facility] Patient Information 2014 [place].  Urinary Tract Infection Urinary tract infections (UTIs) can develop anywhere along your urinary tract. Your urinary tract is your body's drainage system for removing wastes and extra water. Your urinary tract includes two kidneys, two ureters, a bladder, and a urethra. Your kidneys are a pair of bean-shaped organs. Each kidney is about the size of your fist. They are located below your ribs, one on each side of your spine. CAUSES Infections are caused by microbes, which are microscopic organisms, including fungi, viruses, and bacteria. These organisms are so small that they can only be seen through a microscope. Bacteria are the microbes that most commonly cause UTIs. SYMPTOMS  Symptoms of UTIs may vary by age and gender of the patient and by the location of the infection. Symptoms in young women typically include a frequent and intense urge to urinate and a painful, burning feeling in the bladder or urethra during urination. Older  women and men are more likely to be tired, shaky, and weak and have muscle aches and abdominal pain. A fever may mean the infection is in your kidneys. Other symptoms of a kidney infection include pain in your back or sides below the ribs, nausea, and vomiting. DIAGNOSIS To diagnose a UTI, your caregiver will ask you about your symptoms.  Your caregiver also will ask to provide a urine sample. The urine sample will be tested for bacteria and white blood cells. White blood cells are made by your body to help fight infection. TREATMENT  Typically, UTIs can be treated with medication. Because most UTIs are caused by a bacterial infection, they usually can be treated with the use of antibiotics. The choice of antibiotic and length of treatment depend on your symptoms and the type of bacteria causing your infection. HOME CARE INSTRUCTIONS  If you were prescribed antibiotics, take them exactly as your caregiver instructs you. Finish the medication even if you feel better after you have only taken some of the medication.  Drink enough water and fluids to keep your urine clear or pale yellow.  Avoid caffeine, tea, and carbonated beverages. They tend to irritate your bladder.  Empty your bladder often. Avoid holding urine for long periods of time.  Empty your bladder before and after sexual intercourse.  After a bowel movement, women should cleanse from front to back. Use each tissue only once. SEEK MEDICAL CARE IF:   You have back pain.  You develop a fever.  Your symptoms do not begin to resolve within 3 days. SEEK IMMEDIATE MEDICAL CARE IF:   You have severe back pain or lower abdominal pain.  You develop chills.  You have nausea or vomiting.  You have continued burning or discomfort with urination. MAKE SURE YOU:   Understand these instructions.  Will watch your condition.  Will get help right away if you are not doing well or get worse. Document Released: 06/23/2005 Document Revised: 03/14/2012 Document Reviewed: 10/22/2011 [facility] Patient Information 2014 [place].

## 2014-03-12 LAB — URINE CULTURE
CULTURE: NO GROWTH
Colony Count: NO GROWTH

## 2014-03-25 ENCOUNTER — Emergency Department (HOSPITAL_COMMUNITY)
Admission: EM | Admit: 2014-03-25 | Discharge: 2014-03-25 | Disposition: A | Discharge status: Home / Self Care | Payer: BC Managed Care – PPO | Attending: Emergency Medicine | Admitting: Emergency Medicine

## 2014-03-25 ENCOUNTER — Encounter (HOSPITAL_COMMUNITY): Payer: Self-pay | Admitting: Emergency Medicine

## 2014-03-25 DIAGNOSIS — I10 Essential (primary) hypertension: Secondary | ICD-10-CM | POA: Insufficient documentation

## 2014-03-25 DIAGNOSIS — K5900 Constipation, unspecified: Secondary | ICD-10-CM | POA: Insufficient documentation

## 2014-03-25 DIAGNOSIS — Z792 Long term (current) use of antibiotics: Secondary | ICD-10-CM | POA: Insufficient documentation

## 2014-03-25 DIAGNOSIS — R202 Paresthesia of skin: Secondary | ICD-10-CM

## 2014-03-25 DIAGNOSIS — R209 Unspecified disturbances of skin sensation: Secondary | ICD-10-CM | POA: Insufficient documentation

## 2014-03-25 DIAGNOSIS — R059 Cough, unspecified: Secondary | ICD-10-CM | POA: Insufficient documentation

## 2014-03-25 DIAGNOSIS — R05 Cough: Secondary | ICD-10-CM | POA: Insufficient documentation

## 2014-03-25 LAB — BASIC METABOLIC PANEL
BUN: 15 mg/dL (ref 6–23)
CO2: 28 meq/L (ref 19–32)
Calcium: 9.1 mg/dL (ref 8.4–10.5)
Chloride: 97 mEq/L (ref 96–112)
Creatinine, Ser: 0.94 mg/dL (ref 0.50–1.10)
GFR calc Af Amer: 85 mL/min — ABNORMAL LOW (ref >90)
GFR calc non Af Amer: 73 mL/min — ABNORMAL LOW (ref >90)
Glucose, Bld: 111 mg/dL — ABNORMAL HIGH (ref 70–99)
Potassium: 3.8 mEq/L (ref 3.7–5.3)
SODIUM: 135 meq/L — AB (ref 137–147)

## 2014-03-25 NOTE — Discharge Instructions (Signed)
Your providers today feel that your symptoms of tingling and numbness in the legs may be related to your cough medicine. It is recommended that you stop using this for the next one to 2 days and have a recheck of your symptoms with her primary care provider. Return anytime if your symptoms change or worsen.   Paresthesia Paresthesia is a burning or prickling feeling. This feeling can happen in any part of the body. It often happens in the hands, arms, legs, or feet. HOME CARE  Avoid drinking alcohol.  Try massage or needle therapy (acupuncture) to help with your problems.  Keep all doctor visits as told. GET HELP RIGHT AWAY IF:   You feel weak.  You have trouble walking or moving.  You have problems speaking or seeing.  You feel confused.  You cannot control when you poop (bowel movement) or pee (urinate).  You lose feeling (numbness) after an injury.  You pass out (faint).  Your burning or prickling feeling gets worse when you walk.  You have pain, cramps, or feel dizzy.  You have a rash. MAKE SURE YOU:   Understand these instructions.  Will watch your condition.  Will get help right away if you are not doing well or get worse. Document Released: 08/26/2008 Document Revised: 12/06/2011 Document Reviewed: 06/04/2011 [facility] Patient Information 2015 [place], [place]. This information is not intended to replace advice given to you by your health care provider. Make sure you discuss any questions you have with your health care provider.

## 2014-03-25 NOTE — ED Provider Notes (Signed)
CSN: [id number]     Arrival date & time 03/25/14  1558 History  This chart was scribed for non-physician practitioner, Hazel Sams, PA-C working with Neta Ehlers, MD by Einar Pheasant, ED scribe. This patient was seen in room WTR8/WTR8 and the patient's care was started at 8:56 PM.      Chief Complaint  Patient presents with  . Leg Pain    The history is provided by the patient. No language interpreter was used.   HPI Comments: Becky Vazquez is a 43 y.o. female with a history of hypertension presents to the Emergency Department complaining of bilateral leg numbness that started today. Pt states that she has been sick with a sinus infection and she was prescribed some medication. She states that she was concerned that the numbness could have been associated with the medication that she has been taking. She states that she took the medicine and went to sleep yesterday, but when she woke up she noticed that her leg were numb, like "her legs had fallen asleep".  Pt states that sometimes she feels some soreness like a charley horse. She also states that a week ago she was constipated. Pt reports taking "the pill" for her history of fibroids. Denies any pertinent family history. She denies any SOB, chest pain, nausea, emesis, fever, chills, diarrhea, or diaphoresis.  Past Medical History  Diagnosis Date  . Hypertension    Past Surgical History  Procedure Laterality Date  . Tubal ligation    . Cholecystectomy     Family History  Problem Relation Age of Onset  . Stomach cancer Mother   . Cancer Mother   . Prostate cancer Father   . Cancer Father   . Breast cancer Maternal Aunt   . Breast cancer Paternal Aunt    History  Substance Use Topics  . Smoking status: Never Smoker   . Smokeless tobacco: Never Used  . Alcohol Use: No   OB History   Grav Para Term Preterm Abortions TAB SAB Ect Mult Living                 Review of Systems  Constitutional: Negative for fever, chills  and diaphoresis.  Respiratory: Positive for cough. Negative for shortness of breath.   Cardiovascular: Negative for chest pain.  Gastrointestinal: Positive for constipation. Negative for nausea, vomiting, abdominal pain and diarrhea.  Neurological: Positive for numbness (bilateral legs).   Allergies  Review of patient's allergies indicates no known allergies.  Home Medications   Prior to Admission medications   Medication Sig Start Date End Date Taking? Authorizing Provider  azithromycin (ZITHROMAX) 250 MG tablet Take 250-500 mg by mouth every morning. Day 1: take 2 tabs once daily. Days 2-5: take 1 tab once daily. 03/24/14 03/28/14 Yes Historical Provider, MD  lisinopril-hydrochlorothiazide (PRINZIDE,ZESTORETIC) 20-12.5 MG per tablet Take 2 tablets by mouth every morning.    Yes Historical Provider, MD  norethindrone-ethinyl estradiol-iron (MICROGESTIN FE,GILDESS FE,LOESTRIN FE) 1.5-30 MG-MCG tablet Take 1 tablet by mouth at bedtime.   Yes Historical Provider, MD  promethazine-dextromethorphan (PROMETHAZINE-DM) 6.25-15 MG/5ML syrup Take 5 mLs by mouth 4 (four) times daily as needed for cough.   Yes Historical Provider, MD   Triage vitals: BP 112/60  Pulse 79  Temp(Src) 97.9 F (36.6 C) (Oral)  Resp 16  SpO2 98%  LMP 02/28/2014  Physical Exam  Nursing note and vitals reviewed. Constitutional: She is oriented to person, place, and time. She appears well-developed and well-nourished. No distress.  HENT:  Head: Normocephalic and atraumatic.  Eyes: Conjunctivae and EOM are normal. Pupils are equal, round, and reactive to light.  Neck: Normal range of motion. Neck supple.  Cardiovascular: Normal rate.   Pulmonary/Chest: Effort normal. No respiratory distress. She has no wheezes. She has no rales.  Musculoskeletal: Normal range of motion. She exhibits no edema and no tenderness.  No clinical signs concerning for DVT. No tenderness.  Neurological: She is alert and oriented to person,  place, and time. She has normal strength. No sensory deficit. Gait normal.  Reflex Scores:      Patellar reflexes are 2+ on the right side and 2+ on the left side. Skin: Skin is warm and dry.  Psychiatric: She has a normal mood and affect. Her behavior is normal.    ED Course  Procedures   DIAGNOSTIC STUDIES: Oxygen Saturation is 98% on RA, normal by my interpretation.    COORDINATION OF CARE: 9:01 PM- labs with very slight hypokalemia no significant change from 2 weeks ago. Patient is now taking promethazine dextromethorphan cough syrup which may be contributing to some of her symptoms of peripheral paresthesias. Physical exam of the lower extremities is unremarkable. At this time will recommend stopping the cough medicine and having a recheck of symptoms and today if not resolved. Pt advised of plan for treatment and pt agrees.  Results for orders placed during the hospital encounter of 67/59/16  BASIC METABOLIC PANEL      Result Value Ref Range   Sodium 135 (*) 137 - 147 mEq/L   Potassium 3.8  3.7 - 5.3 mEq/L   Chloride 97  96 - 112 mEq/L   CO2 28  19 - 32 mEq/L   Glucose, Bld 111 (*) 70 - 99 mg/dL   BUN 15  6 - 23 mg/dL   Creatinine, Ser 0.94  0.50 - 1.10 mg/dL   Calcium 9.1  8.4 - 10.5 mg/dL   GFR calc non Af Amer 73 (*) >90 mL/min   GFR calc Af Amer 85 (*) >90 mL/min      MDM   Final diagnoses:  Paresthesia of lower extremity      I personally performed the services described in this documentation, which was scribed in my presence. The recorded information has been reviewed and is accurate.    Martie Lee, PA-C 03/25/14 2132

## 2014-03-25 NOTE — ED Notes (Signed)
Per pt, having lower leg pain.  Generalized to both legs.  Pt has not had increase exercise.  Pt has been drinking plenty of fluids.

## 2014-03-25 NOTE — ED Notes (Signed)
States she is having tingling in BLE. Pulses, sensation and ROM WNL. Pt denies pain but states that the tingling gets worse when she stands.

## 2014-03-26 NOTE — ED Provider Notes (Signed)
Medical screening examination/treatment/procedure(s) were performed by non-physician practitioner and as supervising physician I was immediately available for consultation/collaboration.   Neta Ehlers, MD 03/26/14 [phone]

## 2014-05-29 ENCOUNTER — Ambulatory Visit (INDEPENDENT_AMBULATORY_CARE_PROVIDER_SITE_OTHER): Payer: BC Managed Care – PPO | Admitting: Family Medicine

## 2014-05-29 VITALS — BP 116/78 | HR 78 | Temp 98.2°F | Resp 18 | Ht 64.5 in | Wt 215.0 lb

## 2014-05-29 DIAGNOSIS — R300 Dysuria: Secondary | ICD-10-CM

## 2014-05-29 DIAGNOSIS — N912 Amenorrhea, unspecified: Secondary | ICD-10-CM

## 2014-05-29 LAB — POCT URINALYSIS DIPSTICK
Bilirubin, UA: NEGATIVE
Blood, UA: NEGATIVE
Glucose, UA: NEGATIVE
Ketones, UA: NEGATIVE
Nitrite, UA: NEGATIVE
Protein, UA: 30
Spec Grav, UA: 1.02
Urobilinogen, UA: 0.2
pH, UA: 5.5

## 2014-05-29 LAB — POCT UA - MICROSCOPIC ONLY
Casts, Ur, LPF, POC: NEGATIVE
Crystals, Ur, HPF, POC: NEGATIVE
Mucus, UA: NEGATIVE
RBC, urine, microscopic: NEGATIVE
Yeast, UA: NEGATIVE

## 2014-05-29 LAB — POCT URINE PREGNANCY: Preg Test, Ur: NEGATIVE

## 2014-05-29 MED ORDER — NITROFURANTOIN MONOHYD MACRO 100 MG PO CAPS: 100.0000 mg | ORAL_CAPSULE | Freq: Two times a day (BID) | ORAL | Status: DC

## 2014-05-29 NOTE — Patient Instructions (Signed)
We are going to treat you with macrobid for a UTI.  If you are not feeling better in the next couple of days please let me know!  Sooner if you are worse

## 2014-05-29 NOTE — Progress Notes (Addendum)
[facility] [address], [place] [address] [phone]  Date:  05/29/2014   Name:  Becky Vazquez   DOB:  08-24-1971   MRN:  [id number]  PCP:  Antonietta Jewel, MD    Chief Complaint: Dysuria and Urinary Frequency   History of Present Illness:  Becky Vazquez is a 43 y.o. very pleasant female patient who presents with the following:  She is here today with a possible UTI. She has noted sx for about one week.  She tried a monistat pack but her sx persisted.   She notes frequent urination and dysuria.  No vomitng, belly pain, or fever, no back pain.  No vaginal sx now.    Her menses have been irregular recently.  She is   Patient Active Problem List   Diagnosis Date Noted  . Vaginal discharge 05/11/2013  . Dysuria 05/11/2013  . Abdominal pain, acute 06/14/2012  . Enteritis 06/14/2012  . Dehydration 06/14/2012  . Nausea & vomiting 06/14/2012  . Constipation 06/14/2012  . FATIGUE 03/03/2010  . VAGINITIS 10/16/2009  . IMPAIRED GLUCOSE TOLERANCE 01/09/2008  . ANEMIA, IRON DEFICIENCY 01/09/2008  . LEIOMYOMA, UTERUS 02/07/2007  . HYPERTENSION, BENIGN 12/28/2006  . OBESITY, NOS 11/24/2006    Past Medical History  Diagnosis Date  . Hypertension     Past Surgical History  Procedure Laterality Date  . Tubal ligation    . Cholecystectomy      History  Substance Use Topics  . Smoking status: Never Smoker   . Smokeless tobacco: Never Used  . Alcohol Use: No    Family History  Problem Relation Age of Onset  . Stomach cancer Mother   . Cancer Mother   . Prostate cancer Father   . Cancer Father   . Breast cancer Maternal Aunt   . Breast cancer Paternal Aunt     No Known Allergies  Medication list has been reviewed and updated.  Current Outpatient Prescriptions on File Prior to Visit  Medication Sig Dispense Refill  . lisinopril-hydrochlorothiazide (PRINZIDE,ZESTORETIC) 20-12.5 MG per tablet Take 2 tablets by mouth every morning.       .  norethindrone-ethinyl estradiol-iron (MICROGESTIN FE,GILDESS FE,LOESTRIN FE) 1.5-30 MG-MCG tablet Take 1 tablet by mouth at bedtime.      . promethazine-dextromethorphan (PROMETHAZINE-DM) 6.25-15 MG/5ML syrup Take 5 mLs by mouth 4 (four) times daily as needed for cough.       No current facility-administered medications on file prior to visit.    Review of Systems:  As per HPI- otherwise negative.   Physical Examination: Filed Vitals:   05/29/14 1234  BP: 116/78  Pulse: 78  Temp: 98.2 F (36.8 C)  Resp: 18   Filed Vitals:   05/29/14 1234  Height: 5' 4.5" (1.638 m)  Weight: 215 lb (97.523 kg)   Body mass index is 36.35 kg/(m^2). Ideal Body Weight: Weight in (lb) to have BMI = 25: 147.6  GEN: WDWN, NAD, Non-toxic, A & O x 3, obese, looks well HEENT: Atraumatic, Normocephalic. Neck supple. No masses, No LAD. Ears and Nose: No external deformity. CV: RRR, No M/G/R. No JVD. No thrill. No extra heart sounds. PULM: CTA B, no wheezes, crackles, rhonchi. No retractions. No resp. distress. No accessory muscle use. ABD: S, NT, ND. No rebound. No HSM.  Benign exam, no CVA tenderness  EXTR: No c/c/e NEURO Normal gait.  PSYCH: Normally interactive. Conversant. Not depressed or anxious appearing.  Calm demeanor.    Results for orders placed in visit  on 05/29/14  POCT UA - MICROSCOPIC ONLY      Result Value Ref Range   WBC, Ur, HPF, POC 25-30     RBC, urine, microscopic neg     Bacteria, U Microscopic trace     Mucus, UA neg     Epithelial cells, urine per micros 3-8     Crystals, Ur, HPF, POC neg     Casts, Ur, LPF, POC neg     Yeast, UA neg    POCT URINALYSIS DIPSTICK      Result Value Ref Range   Color, UA yellow     Clarity, UA cloudy     Glucose, UA neg     Bilirubin, UA neg     Ketones, UA neg     Spec Grav, UA 1.020     Blood, UA neg     pH, UA 5.5     Protein, UA 30     Urobilinogen, UA 0.2     Nitrite, UA neg     Leukocytes, UA small (1+)    POCT URINE  PREGNANCY      Result Value Ref Range   Preg Test, Ur Negative        Assessment and Plan: Dysuria - Plan: POCT UA - Microscopic Only, POCT urinalysis dipstick, Urine culture, nitrofurantoin, macrocrystal-monohydrate, (MACROBID) 100 MG capsule  Amenorrhea - Plan: POCT urine pregnancy  Apparent UTI.  Await culture but will treat with macrobid now.   Signed Lamar Blinks, MD  Called on 9/4 with culture- she has GBS.  Will change to penicillin- will call in for her now and she will stop macrobid.

## 2014-05-31 LAB — URINE CULTURE: Colony Count: 70000

## 2014-05-31 MED ORDER — PENICILLIN V POTASSIUM 500 MG PO TABS: 500.0000 mg | ORAL_TABLET | Freq: Two times a day (BID) | ORAL | Status: DC

## 2014-05-31 NOTE — Addendum Note (Signed)
Addended by: Lamar Blinks C on: 05/31/2014 03:31 PM   Modules accepted: Orders

## 2014-06-26 ENCOUNTER — Other Ambulatory Visit: Payer: Self-pay | Admitting: Obstetrics and Gynecology

## 2014-07-12 ENCOUNTER — Other Ambulatory Visit: Payer: Self-pay

## 2014-11-08 ENCOUNTER — Ambulatory Visit (INDEPENDENT_AMBULATORY_CARE_PROVIDER_SITE_OTHER): Payer: BLUE CROSS/BLUE SHIELD | Admitting: Internal Medicine

## 2014-11-08 VITALS — BP 122/80 | HR 80 | Temp 98.3°F | Resp 18 | Ht 64.5 in | Wt 206.4 lb

## 2014-11-08 DIAGNOSIS — A5901 Trichomonal vulvovaginitis: Secondary | ICD-10-CM

## 2014-11-08 DIAGNOSIS — N898 Other specified noninflammatory disorders of vagina: Secondary | ICD-10-CM

## 2014-11-08 DIAGNOSIS — R350 Frequency of micturition: Secondary | ICD-10-CM

## 2014-11-08 LAB — POCT WET PREP WITH KOH
Clue Cells Wet Prep HPF POC: NEGATIVE
KOH PREP POC: NEGATIVE
TRICHOMONAS UA: POSITIVE
Yeast Wet Prep HPF POC: NEGATIVE

## 2014-11-08 LAB — POCT UA - MICROSCOPIC ONLY
Casts, Ur, LPF, POC: NEGATIVE
Crystals, Ur, HPF, POC: NEGATIVE
MUCUS UA: NEGATIVE
Yeast, UA: NEGATIVE

## 2014-11-08 LAB — POCT URINALYSIS DIPSTICK
Blood, UA: NEGATIVE
Glucose, UA: NEGATIVE
KETONES UA: NEGATIVE
Nitrite, UA: NEGATIVE
PH UA: 5
PROTEIN UA: 30
Spec Grav, UA: >=1.03
Urobilinogen, UA: 0.2

## 2014-11-08 MED ORDER — METRONIDAZOLE 500 MG PO TABS: 500.0000 mg | ORAL_TABLET | Freq: Two times a day (BID) | ORAL | Status: DC

## 2014-11-08 NOTE — Patient Instructions (Signed)
Use flagyl as directed. No alcohol while you are taking this med. Treatment of your sexual partners. Use condoms. If your symptoms worsen or do not improve return forr  re eval.

## 2014-11-08 NOTE — Progress Notes (Signed)
Subjective:    Patient ID: Becky Vazquez, female    DOB: 02/26/1971, 44 y.o.   MRN: [id number]  HPI  44 year old female with chief complaint of vaginal discharge HPI One week history of vaginal discharge, white, mild in severity with itching, no abdominal or pelvic pain, no fever.  One sexual partner. Had trich two months ago that was treated but her partner may not have been treated. No chlamydia or gc in the pasts. No pregnancies Sees a gyn yearly and is on bcp for heavy menses and fibroids. Non smoker   Review of Systems  Constitutional: Negative.   HENT: Negative.   Eyes: Negative.   Respiratory: Negative.   Cardiovascular: Negative.   Gastrointestinal: Negative.   Endocrine: Negative.   Genitourinary: Positive for dysuria, urgency and vaginal discharge. Negative for flank pain, decreased urine volume, vaginal bleeding, genital sores, vaginal pain and pelvic pain.  Musculoskeletal: Negative.   Skin: Negative.   Allergic/Immunologic: Negative.   Neurological: Negative.   Hematological: Negative.   Psychiatric/Behavioral: Negative.   All other systems reviewed and are negative.      Objective:   Physical Exam  Constitutional: She is oriented to person, place, and time. She appears well-developed and well-nourished.  HENT:  Head: Normocephalic and atraumatic.  Nose: Nose normal.  Mouth/Throat: Oropharynx is clear and moist.  Eyes: Conjunctivae and EOM are normal. Pupils are equal, round, and reactive to light.  Neck: Normal range of motion. Neck supple.  Cardiovascular: Normal rate, normal heart sounds and intact distal pulses.   Pulmonary/Chest: Effort normal and breath sounds normal.  Abdominal: Soft. Bowel sounds are normal.  Genitourinary: Vaginal discharge found.  Whitish gray vaginal discharge Positive for fishy odor uterus enlarged and consistent with hx of fibroids. non tender pelvic exam no cm tenderness, cx closed without visible lesions    Musculoskeletal: Normal range of motion.  Neurological: She is alert and oriented to person, place, and time.  Skin: Skin is warm and dry.  Psychiatric: She has a normal mood and affect. Her behavior is normal. Judgment and thought content normal.  Nursing note and vitals reviewed.  Results for orders placed or performed in visit on 11/08/14  POCT urinalysis dipstick  Result Value Ref Range   Color, UA orange    Clarity, UA cloudy    Glucose, UA neg    Bilirubin, UA small    Ketones, UA neg    Spec Grav, UA >=1.030    Blood, UA neg    pH, UA 5.0    Protein, UA 30    Urobilinogen, UA 0.2    Nitrite, UA neg    Leukocytes, UA Trace   POCT UA - Microscopic Only  Result Value Ref Range   WBC, Ur, HPF, POC 5-10    RBC, urine, microscopic 0-2    Bacteria, U Microscopic small    Mucus, UA neg    Epithelial cells, urine per micros 1-3    Crystals, Ur, HPF, POC neg    Casts, Ur, LPF, POC neg    Yeast, UA neg    Trichomonas, UA    POCT Wet Prep with KOH  Result Value Ref Range   Trichomonas, UA Positive    Clue Cells Wet Prep HPF POC neg    Epithelial Wet Prep HPF POC 2-4    Yeast Wet Prep HPF POC neg    Bacteria Wet Prep HPF POC 3+    RBC Wet Prep HPF POC 20-25  WBC Wet Prep HPF POC 5-9    KOH Prep POC Negative   note positive for trichomaonas      Assessment & Plan:  1.Vaginal discharge   sexually active woman with one partner. Uses condoms.not sexually active for the past month. Was treated for trich one month ago but her sexual partner was not.  Wet prep is positive for trich Will rx with flagyl bid for one week Treatment of sexual partner recommended No alcohol with flagyl F/up in one week

## 2014-11-09 LAB — GC/CHLAMYDIA PROBE AMP
CT Probe RNA: NEGATIVE
GC PROBE AMP APTIMA: NEGATIVE

## 2014-11-14 IMAGING — CR DG CHEST 2V
2 series · 2 of 2 positions shown · non-contrast
Comparison: 11/08/2013

CLINICAL DATA: Hypertension

EXAM:
CHEST  2 VIEW

[w chest pa]
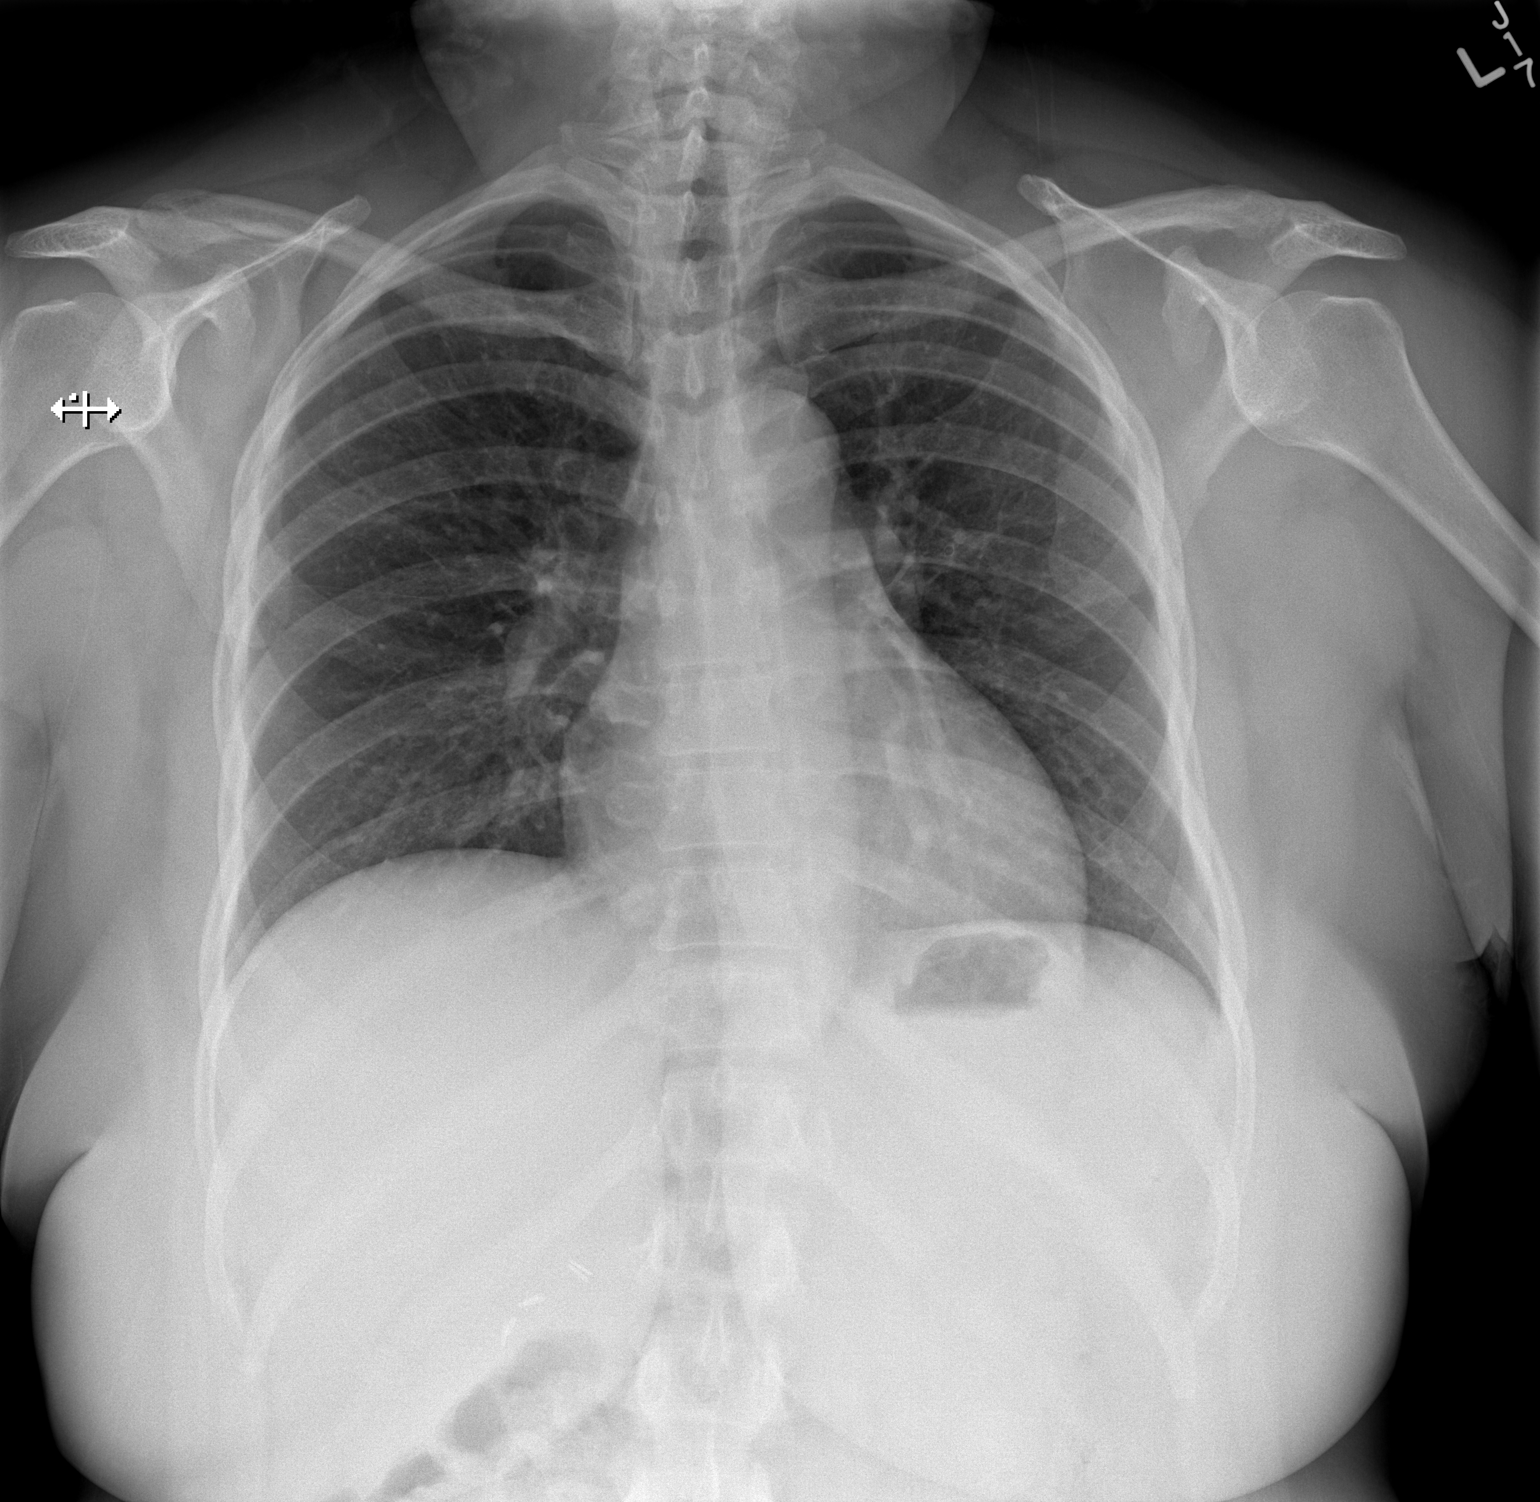

[w chest lat]
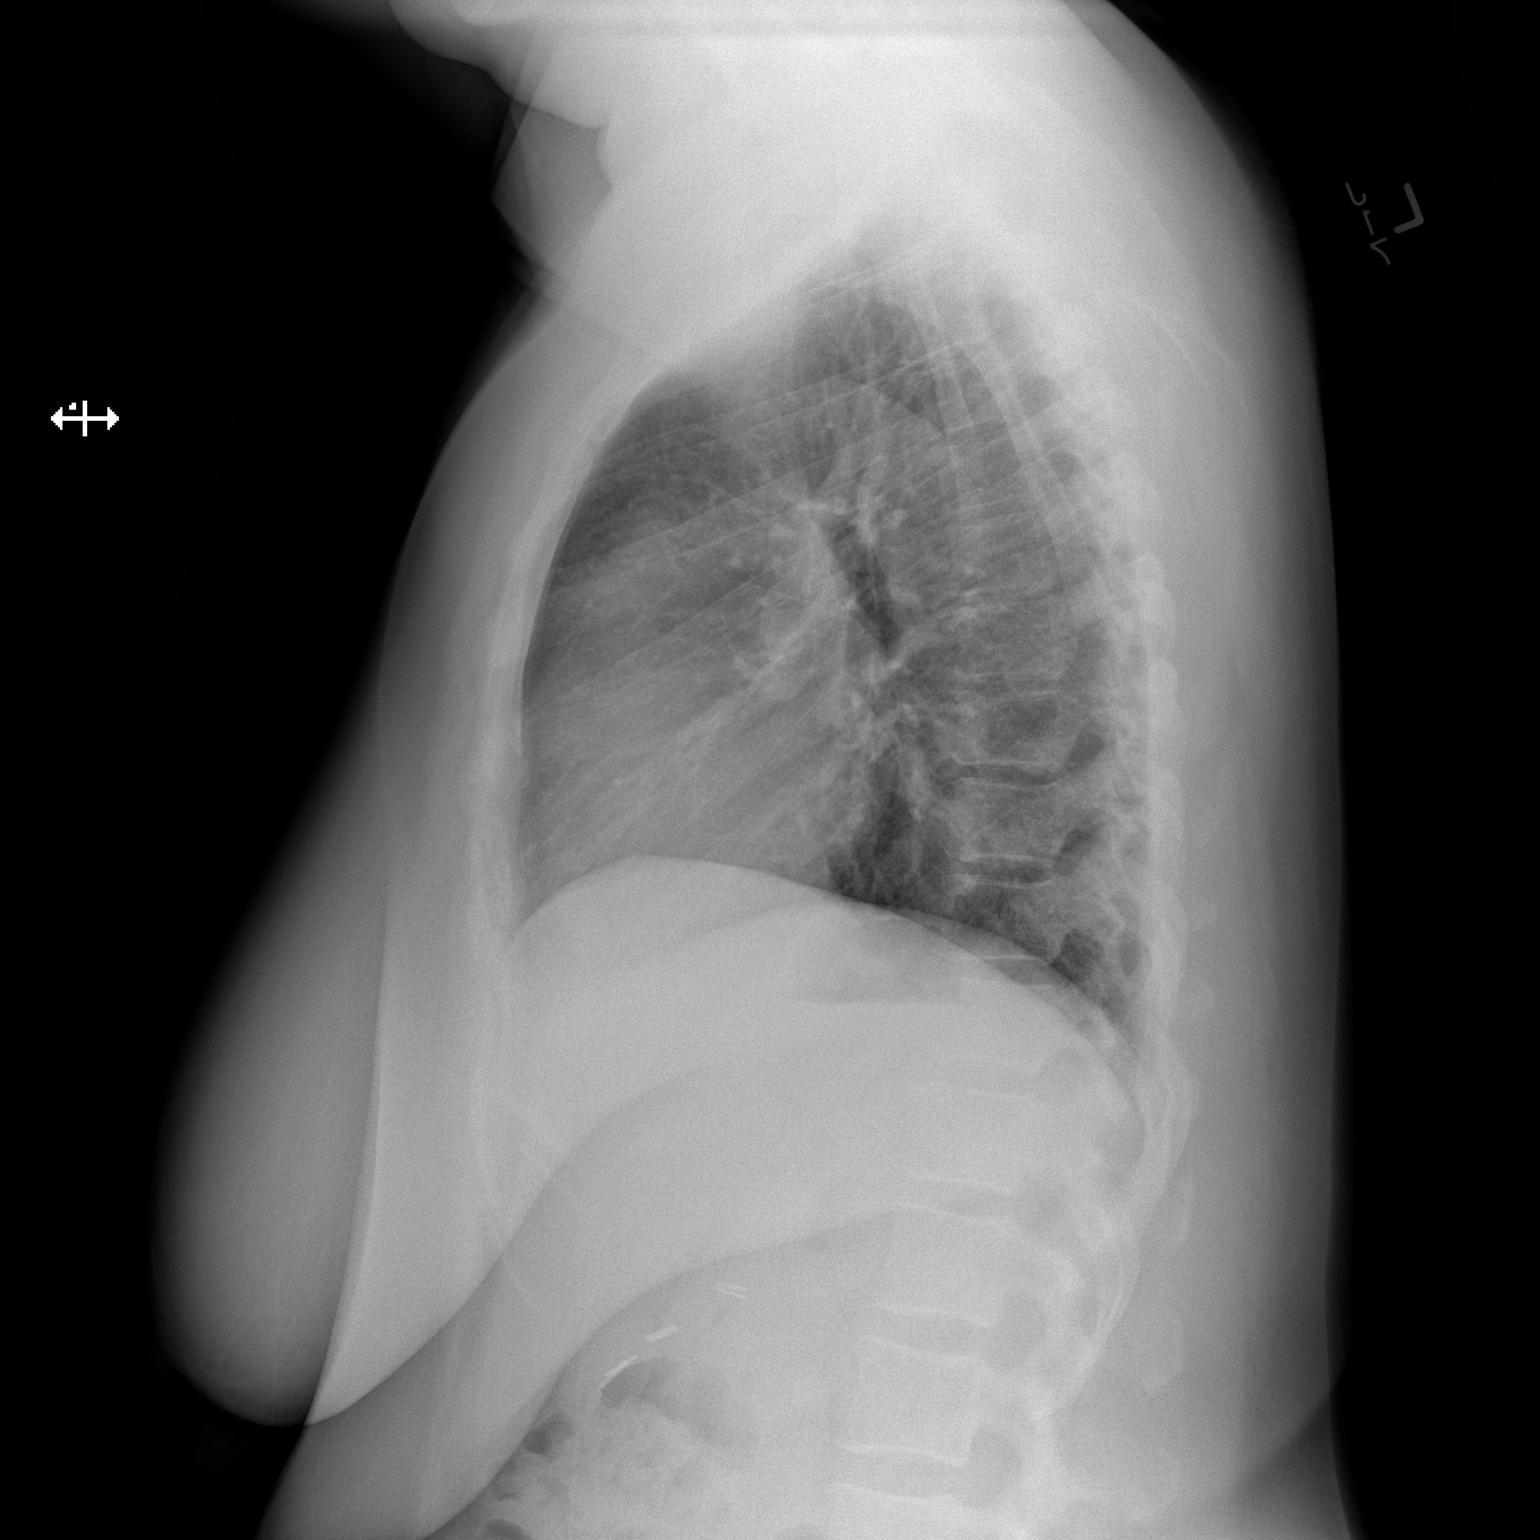

[2 of 2 positions shown; findings below may reference images not displayed]

FINDINGS: Cardiomediastinal silhouette is stable. No acute infiltrate or
pleural effusion. No pulmonary edema. Bony thorax is unremarkable.
IMPRESSION: No active cardiopulmonary disease.

## 2014-12-02 ENCOUNTER — Ambulatory Visit (INDEPENDENT_AMBULATORY_CARE_PROVIDER_SITE_OTHER): Payer: BLUE CROSS/BLUE SHIELD | Admitting: Physician Assistant

## 2014-12-02 VITALS — BP 128/70 | HR 103 | Temp 98.9°F | Resp 20 | Ht 65.0 in | Wt 211.0 lb

## 2014-12-02 DIAGNOSIS — J029 Acute pharyngitis, unspecified: Secondary | ICD-10-CM

## 2014-12-02 DIAGNOSIS — J309 Allergic rhinitis, unspecified: Secondary | ICD-10-CM | POA: Insufficient documentation

## 2014-12-02 DIAGNOSIS — R05 Cough: Secondary | ICD-10-CM | POA: Diagnosis not present

## 2014-12-02 DIAGNOSIS — J3089 Other allergic rhinitis: Secondary | ICD-10-CM

## 2014-12-02 DIAGNOSIS — R059 Cough, unspecified: Secondary | ICD-10-CM

## 2014-12-02 LAB — POCT RAPID STREP A (OFFICE): Rapid Strep A Screen: NEGATIVE

## 2014-12-02 MED ORDER — HYDROCODONE-HOMATROPINE 5-1.5 MG/5ML PO SYRP: 5.0000 mL | ORAL_SOLUTION | Freq: Three times a day (TID) | ORAL | Status: DC | PRN

## 2014-12-02 NOTE — Patient Instructions (Signed)
Your strep swab was negative. I sent a culture for confirmation and will let you know if this comes back positive.  I think your congestion may be due to allergies. Please take an antihistamine like claritin once daily. Please use flonase 2 sprays in each nostril once daily. See if these measures help your allergies over the next month or so.  I don't think you have any other signs of a bacterial infection at this time. Please come back to clinic if you're not feeling better in 4-5 days.

## 2014-12-02 NOTE — Progress Notes (Signed)
   Subjective:    Patient ID: Becky Vazquez, female    DOB: [birth year], 43 y.o.   MRN: [id number]  HPI    Review of Systems     Objective:   Physical Exam        Assessment & Plan:   Added hycodan for cough.

## 2014-12-02 NOTE — Addendum Note (Signed)
Addended byJulieta Gutting on: 12/02/2014 08:48 PM   Modules accepted: Orders

## 2014-12-02 NOTE — Progress Notes (Addendum)
Subjective:    Patient ID: Becky Vazquez, female    DOB: 26-Oct-1970, 44 y.o.   MRN: [id number]  Chief Complaint  Patient presents with  . Cough    x 1 day  . Sore Throat  . Generalized Body Aches   Patient Active Problem List   Diagnosis Date Noted  . Allergic rhinitis 12/02/2014  . Vaginal discharge 05/11/2013  . Dysuria 05/11/2013  . Abdominal pain, acute 06/14/2012  . Enteritis 06/14/2012  . Dehydration 06/14/2012  . Nausea & vomiting 06/14/2012  . Constipation 06/14/2012  . FATIGUE 03/03/2010  . VAGINITIS 10/16/2009  . IMPAIRED GLUCOSE TOLERANCE 01/09/2008  . ANEMIA, IRON DEFICIENCY 01/09/2008  . LEIOMYOMA, UTERUS 02/07/2007  . HYPERTENSION, BENIGN 12/28/2006  . OBESITY, NOS 11/24/2006   Prior to Admission medications   Medication Sig Start Date End Date Taking? Authorizing Provider  cyanocobalamin 2000 MCG tablet Take 2,000 mcg by mouth daily.   Yes Historical Provider, MD  lisinopril-hydrochlorothiazide (PRINZIDE,ZESTORETIC) 20-12.5 MG per tablet Take 2 tablets by mouth every morning.    Yes Historical Provider, MD  norethindrone-ethinyl estradiol-iron (MICROGESTIN FE,GILDESS FE,LOESTRIN FE) 1.5-30 MG-MCG tablet Take 1 tablet by mouth at bedtime.   Yes Historical Provider, MD  phentermine 37.5 MG capsule Take 37.5 mg by mouth every morning.   Yes Historical Provider, MD   Medications, allergies, past medical history, surgical history, family history, social history and problem list reviewed and updated.  HPI  38 yof with no pertinent pmh presents with one day hx cough, st, body aches.  Sx started yest morning with dry cough, she felt congested throughout the day. She felt her chest was sore, tender after the coughing spells. She denies any recent cp with exertion. Denies sob, presyncope, syncope, palps. Later in the day yest started having severe st and generalized body aches. This has persisted into today.  Denies abd pain, otalgia, n/v, diarrhea.   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 Her daughter is with her today and states the pt has been sneezing more than normal since starting the job. Pt denies itchy/watery eyes. Pt mentions ears have been itchy bilaterally recently. Has not taken anything for allergies.    Has taken Tylenol cold & sinus without much relief. Denies fever, chills.   Review of Systems See HPI.     Objective:   Physical Exam  Constitutional: She appears well-developed and well-nourished.  Non-toxic appearance. She does not have a sickly appearance. She does not appear ill. No distress.  BP 128/70 mmHg  Pulse 103  Temp(Src) 98.9 F (37.2 C)  Resp 20  Ht 5\' 5"  (1.651 m)  Wt 211 lb (95.709 kg)  BMI 35.11 kg/m2  SpO2 97%  LMP 12/26/2013   HENT:  Right Ear: Tympanic membrane is not erythematous. A middle ear effusion is present.  Left Ear: Tympanic membrane is not erythematous. A middle ear effusion is present.  Nose: Mucosal edema and rhinorrhea present. Right sinus exhibits no maxillary sinus tenderness and no frontal sinus tenderness. Left sinus exhibits no maxillary sinus tenderness and no frontal sinus tenderness.  Mouth/Throat: Uvula is midline, oropharynx is clear and moist and mucous membranes are normal. No oropharyngeal exudate, posterior oropharyngeal edema, posterior oropharyngeal erythema or tonsillar abscesses.  Eyes: Conjunctivae are normal.  Pulmonary/Chest: Effort normal and breath sounds normal. She has no decreased breath sounds. She has no wheezes. She has no rhonchi. She has no rales.  Lymphadenopathy:       Head (right side):  No submental, no submandibular and no tonsillar adenopathy present.       Head (left side): No submental, no submandibular and no tonsillar adenopathy present.    She has no cervical adenopathy.   Results for orders placed or performed in visit on 12/02/14  POCT rapid strep A  Result Value Ref Range   Rapid Strep A Screen Negative Negative        Assessment & Plan:   70 yof with no pertinent pmh presents with one day hx cough, st, body aches.  Sore throat - Plan: POCT rapid strep A Other allergic rhinitis --benign exam, pt desired strep swab though throat looked benign on exam, vitals stable with mild tachycardia --neg strep, cx sent --suspect viral uri/allergic rhinitis causing congestion with resulting st --claritin/flonase for allergies --rest/fluids/tylenol prn/lozenges/decongestant/humidifier at night for sx --rtc 4-5 days if not improved  Julieta Gutting, PA-C Physician Assistant-Certified [facility]  12/02/2014 8:34 PM  I have participated in the care of this patient with the Advanced Practice Provider and agree with Diagnosis and Plan as documented. Robert P. Laney Pastor, M.D.

## 2014-12-04 LAB — CULTURE, GROUP A STREP: Organism ID, Bacteria: NORMAL

## 2014-12-25 ENCOUNTER — Encounter (HOSPITAL_COMMUNITY): Payer: Self-pay | Admitting: Emergency Medicine

## 2014-12-25 ENCOUNTER — Other Ambulatory Visit (HOSPITAL_COMMUNITY)
Admission: RE | Admit: 2014-12-25 | Discharge: 2014-12-25 | Disposition: A | Discharge status: Home / Self Care | Payer: BLUE CROSS/BLUE SHIELD | Source: Ambulatory Visit | Attending: Family Medicine | Admitting: Family Medicine

## 2014-12-25 ENCOUNTER — Emergency Department (HOSPITAL_COMMUNITY)
Admission: EM | Admit: 2014-12-25 | Discharge: 2014-12-25 | Disposition: A | Discharge status: Home / Self Care | Payer: BLUE CROSS/BLUE SHIELD | Source: Home / Self Care | Attending: Family Medicine | Admitting: Family Medicine

## 2014-12-25 DIAGNOSIS — B3731 Acute candidiasis of vulva and vagina: Secondary | ICD-10-CM

## 2014-12-25 DIAGNOSIS — B373 Candidiasis of vulva and vagina: Secondary | ICD-10-CM | POA: Diagnosis not present

## 2014-12-25 DIAGNOSIS — N760 Acute vaginitis: Secondary | ICD-10-CM | POA: Diagnosis present

## 2014-12-25 DIAGNOSIS — Z113 Encounter for screening for infections with a predominantly sexual mode of transmission: Secondary | ICD-10-CM | POA: Diagnosis not present

## 2014-12-25 LAB — POCT URINALYSIS DIP (DEVICE)
Bilirubin Urine: NEGATIVE
Glucose, UA: NEGATIVE mg/dL
Ketones, ur: NEGATIVE mg/dL
LEUKOCYTES UA: NEGATIVE
Nitrite: NEGATIVE
PH: 5.5 (ref 5.0–8.0)
PROTEIN: 100 mg/dL — AB
Specific Gravity, Urine: >=1.03 (ref 1.005–1.030)
UROBILINOGEN UA: 0.2 mg/dL (ref 0.0–1.0)

## 2014-12-25 MED ORDER — FLUCONAZOLE 150 MG PO TABS: 150.0000 mg | ORAL_TABLET | ORAL | Status: DC

## 2014-12-25 NOTE — ED Provider Notes (Signed)
Becky Vazquez is a 44 y.o. female who presents [facility] today for vaginal irritation and discharge present for the last few days. Symptoms consistent with prior episodes of yeast infection. She recently completed a course of antibiotics for otitis. No fevers or chills. Patient notes some runny with urination but denies any frequency urgency or dysuria. No vomiting or diarrhea.   Past Medical History  Diagnosis Date  . Hypertension    Past Surgical History  Procedure Laterality Date  . Tubal ligation    . Cholecystectomy     History  Substance Use Topics  . Smoking status: Never Smoker   . Smokeless tobacco: Never Used  . Alcohol Use: No   ROS as above Medications: No current facility-administered medications for this encounter.   Current Outpatient Prescriptions  Medication Sig Dispense Refill  . lisinopril-hydrochlorothiazide (PRINZIDE,ZESTORETIC) 20-12.5 MG per tablet Take 2 tablets by mouth every morning.     . norethindrone-ethinyl estradiol-iron (MICROGESTIN FE,GILDESS FE,LOESTRIN FE) 1.5-30 MG-MCG tablet Take 1 tablet by mouth at bedtime.    . phentermine 37.5 MG capsule Take 37.5 mg by mouth every morning.    . cyanocobalamin 2000 MCG tablet Take 2,000 mcg by mouth daily.    . fluconazole (DIFLUCAN) 150 MG tablet Take 1 tablet (150 mg total) by mouth every 3 (three) days. 2 tablet 1  . HYDROcodone-homatropine (HYCODAN) 5-1.5 MG/5ML syrup Take 5 mLs by mouth every 8 (eight) hours as needed for cough. 120 mL 0   Allergies  Allergen Reactions  . Promethazine-Dm     Loss of sensation.      Exam:  BP 131/86 mmHg  Pulse 84  Temp(Src) 98 F (36.7 C) (Oral)  Resp 18  SpO2 100%  LMP 12/26/2013 Gen: Well NAD HEENT: EOMI,  MMM Lungs: Normal work of breathing. CTABL Heart: RRR no MRG Abd: NABS, Soft. Nondistended, Nontender Exts: Brisk capillary refill, warm and well perfused.  GYN: Normal external genitalia. Vaginal canal with clumpy white discharge.  Normal-appearing cervix. Nontender.  Results for orders placed or performed during the hospital encounter of 12/25/14 (from the past 24 hour(s))  POCT urinalysis dip (device)     Status: Abnormal   Collection Time: 12/25/14 11:08 AM  Result Value Ref Range   Glucose, UA NEGATIVE NEGATIVE mg/dL   Bilirubin Urine NEGATIVE NEGATIVE   Ketones, ur NEGATIVE NEGATIVE mg/dL   Specific Gravity, Urine >=1.030 1.005 - 1.030   Hgb urine dipstick TRACE (A) NEGATIVE   pH 5.5 5.0 - 8.0   Protein, ur 100 (A) NEGATIVE mg/dL   Urobilinogen, UA 0.2 0.0 - 1.0 mg/dL   Nitrite NEGATIVE NEGATIVE   Leukocytes, UA NEGATIVE NEGATIVE   No results found.  Assessment and Plan: 44 y.o. female with yeast vaginitis. Cytology for gonorrhea Chlamydia trichomonas BV yeast pending. Urine culture pending. Treat with fluconazole.  Discussed warning signs or symptoms. Please see discharge instructions. Patient expresses understanding.     Gregor Hams, MD 12/25/14 [phone]

## 2014-12-25 NOTE — Discharge Instructions (Signed)
Thank you for coming in today. If your belly pain worsens, or you have high fever, bad vomiting, blood in your stool or black tarry stool go to the Emergency Room.    Candidal Vulvovaginitis Candidal vulvovaginitis is an infection of the vagina and vulva. The vulva is the skin around the opening of the vagina. This may cause itching and discomfort in and around the vagina.  HOME CARE  Only take medicine as told by your doctor.  Do not have sex (intercourse) until the infection is healed or as told by your doctor.  Practice safe sex.  Tell your sex partner about your infection.  Do not douche or use tampons.  Wear cotton underwear. Do not wear tight pants or panty hose.  Eat yogurt. This may help treat and prevent yeast infections. GET HELP RIGHT AWAY IF:   You have a fever.  Your problems get worse during treatment or do not get better in 3 days.  You have discomfort, irritation, or itching in your vagina or vulva area.  You have pain after sex.  You start to get belly (abdominal) pain. MAKE SURE YOU:  Understand these instructions.  Will watch your condition.  Will get help right away if you are not doing well or get worse. Document Released: 12/10/2008 Document Revised: 09/18/2013 Document Reviewed: 12/10/2008 [facility] Patient Information 2015 [place], [place]. This information is not intended to replace advice given to you by your health care provider. Make sure you discuss any questions you have with your health care provider.

## 2014-12-25 NOTE — ED Notes (Signed)
C/o poss yeast inf onset Thursday Reports she just finished Cipro and prednisone for bronchitis Sx also include dysuria  Denies fevers, chill Alert, no signs of acute distress.

## 2014-12-26 LAB — CERVICOVAGINAL ANCILLARY ONLY
CHLAMYDIA, DNA PROBE: NEGATIVE
Neisseria Gonorrhea: NEGATIVE
Wet Prep (BD Affirm): POSITIVE — AB

## 2014-12-27 LAB — URINE CULTURE
Colony Count: 50000
SPECIAL REQUESTS: NORMAL

## 2014-12-27 NOTE — ED Notes (Signed)
Urine culture: Group B strep (S. Agalactiae), GC/Chlamydia neg., Gardnerella pos.  Message sent to Dr. Georgina Snell. Roselyn Meier 12/27/2014

## 2014-12-28 ENCOUNTER — Telehealth (HOSPITAL_COMMUNITY): Payer: Self-pay | Admitting: Family Medicine

## 2014-12-28 MED ORDER — METRONIDAZOLE 500 MG PO TABS: 500.0000 mg | ORAL_TABLET | Freq: Two times a day (BID) | ORAL | Status: DC

## 2014-12-28 MED ORDER — CEPHALEXIN 500 MG PO CAPS: 500.0000 mg | ORAL_CAPSULE | Freq: Two times a day (BID) | ORAL | Status: DC

## 2014-12-28 NOTE — ED Notes (Signed)
BV and urine culture positive.  Keflex and Flagyl sent in to pharmacy.  Patient notified.   Gregor Hams, MD 12/28/14 [phone]

## 2015-01-21 ENCOUNTER — Emergency Department (HOSPITAL_COMMUNITY)
Admission: EM | Admit: 2015-01-21 | Discharge: 2015-01-21 | Disposition: A | Discharge status: Home / Self Care | Payer: BLUE CROSS/BLUE SHIELD | Attending: Emergency Medicine | Admitting: Emergency Medicine

## 2015-01-21 ENCOUNTER — Encounter (HOSPITAL_COMMUNITY): Payer: Self-pay | Admitting: Emergency Medicine

## 2015-01-21 DIAGNOSIS — Z792 Long term (current) use of antibiotics: Secondary | ICD-10-CM | POA: Insufficient documentation

## 2015-01-21 DIAGNOSIS — Z9049 Acquired absence of other specified parts of digestive tract: Secondary | ICD-10-CM | POA: Diagnosis not present

## 2015-01-21 DIAGNOSIS — R300 Dysuria: Secondary | ICD-10-CM | POA: Diagnosis present

## 2015-01-21 DIAGNOSIS — N12 Tubulo-interstitial nephritis, not specified as acute or chronic: Secondary | ICD-10-CM | POA: Diagnosis not present

## 2015-01-21 DIAGNOSIS — Z9851 Tubal ligation status: Secondary | ICD-10-CM | POA: Insufficient documentation

## 2015-01-21 DIAGNOSIS — I10 Essential (primary) hypertension: Secondary | ICD-10-CM | POA: Insufficient documentation

## 2015-01-21 DIAGNOSIS — Z79899 Other long term (current) drug therapy: Secondary | ICD-10-CM | POA: Diagnosis not present

## 2015-01-21 LAB — URINALYSIS, ROUTINE W REFLEX MICROSCOPIC
Glucose, UA: NEGATIVE mg/dL
Hgb urine dipstick: NEGATIVE
Ketones, ur: NEGATIVE mg/dL
Nitrite: POSITIVE — AB
Protein, ur: 30 mg/dL — AB
SPECIFIC GRAVITY, URINE: 1.035 — AB (ref 1.005–1.030)
Urobilinogen, UA: 1 mg/dL (ref 0.0–1.0)
pH: 5 (ref 5.0–8.0)

## 2015-01-21 LAB — URINE MICROSCOPIC-ADD ON

## 2015-01-21 MED ORDER — CEPHALEXIN 500 MG PO CAPS
500.0000 mg | ORAL_CAPSULE | Freq: Once | ORAL | Status: AC
  Administered 2015-01-21: 500 mg via ORAL
  Filled 2015-01-21: qty 1

## 2015-01-21 MED ORDER — TRAMADOL HCL 50 MG PO TABS
50.0000 mg | ORAL_TABLET | Freq: Once | ORAL | Status: AC
  Administered 2015-01-21: 50 mg via ORAL
  Filled 2015-01-21: qty 1

## 2015-01-21 MED ORDER — CEPHALEXIN 500 MG PO CAPS: 500.0000 mg | ORAL_CAPSULE | Freq: Three times a day (TID) | ORAL | Status: DC

## 2015-01-21 MED ORDER — TRAMADOL HCL 50 MG PO TABS: 50.0000 mg | ORAL_TABLET | Freq: Four times a day (QID) | ORAL | Status: DC | PRN

## 2015-01-21 NOTE — ED Notes (Addendum)
Patient states she believes she has a kidney infection or UTI. C/o R flank pain and pelvic pain. Patient states she recently had a UTI within the last month, was placed on abx and states she took all the medication. Patient c/o burning with urination, increased frequency with decreased output. Patient states she has been taking AZO x2 days, and her urine is orange.

## 2015-01-21 NOTE — ED Provider Notes (Signed)
CSN: [id number]     Arrival date & time 01/21/15  7169 History   First MD Initiated Contact with Patient 01/21/15 1303     Chief Complaint  Patient presents with  . Urinary Tract Infection     (Consider location/radiation/quality/duration/timing/severity/associated sxs/prior Treatment) HPI Comments: Patient states she believes she has a kidney infection or UTI, she has been having R flank pain with suprapubic pain since Saturday. Patient states she recently had a UTI within the last month, was placed on abx and states she took all the medication, was also treated for yeast infection at that time. Patient c/o burning with urination, increased frequency with decreased output. Patient states she has been taking AZO x2 days, and her urine is orange. He denies any fevers, nausea, vomiting, vaginal bleeding or discharge.  Patient is a 44 y.o. female presenting with urinary tract infection.  Urinary Tract Infection    Past Medical History  Diagnosis Date  . Hypertension    Past Surgical History  Procedure Laterality Date  . Tubal ligation    . Cholecystectomy     Family History  Problem Relation Age of Onset  . Stomach cancer Mother   . Cancer Mother   . Prostate cancer Father   . Cancer Father   . Breast cancer Maternal Aunt   . Breast cancer Paternal Aunt    History  Substance Use Topics  . Smoking status: Never Smoker   . Smokeless tobacco: Never Used  . Alcohol Use: No   OB History    No data available     Review of Systems  Genitourinary: Positive for dysuria, urgency, frequency and flank pain. Negative for vaginal bleeding and vaginal discharge.  All other systems reviewed and are negative.     Allergies  Promethazine-dm  Home Medications   Prior to Admission medications   Medication Sig Start Date End Date Taking? Authorizing Provider  Cranberry-Vitamin C-Probiotic (AZO CRANBERRY PO) Take 2 tablets by mouth 3 (three) times daily as needed (pain).   Yes  Historical Provider, MD  cyanocobalamin 2000 MCG tablet Take 2,000 mcg by mouth daily.   Yes Historical Provider, MD  hydrochlorothiazide (HYDRODIURIL) 25 MG tablet Take 1 tablet by mouth daily. 10/21/14  Yes Historical Provider, MD  lisinopril (PRINIVIL,ZESTRIL) 20 MG tablet Take 1 tablet by mouth daily. 10/21/14  Yes Historical Provider, MD  norethindrone-ethinyl estradiol-iron (MICROGESTIN FE,GILDESS FE,LOESTRIN FE) 1.5-30 MG-MCG tablet Take 1 tablet by mouth at bedtime.   Yes Historical Provider, MD  phentermine 37.5 MG capsule Take 37.5 mg by mouth every morning.   Yes Historical Provider, MD  cephALEXin (KEFLEX) 500 MG capsule Take 1 capsule (500 mg total) by mouth 2 (two) times daily. Patient not taking: Reported on 01/21/2015 12/28/14   Gregor Hams, MD  cephALEXin (KEFLEX) 500 MG capsule Take 1 capsule (500 mg total) by mouth 3 (three) times daily. 01/21/15   Braddock Servellon, PA-C  fluconazole (DIFLUCAN) 150 MG tablet Take 1 tablet (150 mg total) by mouth every 3 (three) days. Patient not taking: Reported on 01/21/2015 12/25/14   Gregor Hams, MD  HYDROcodone-homatropine [facility]) 5-1.5 MG/5ML syrup Take 5 mLs by mouth every 8 (eight) hours as needed for cough. Patient not taking: Reported on 01/21/2015 12/02/14   Merlinda Frederick McVeigh, PA  metroNIDAZOLE (FLAGYL) 500 MG tablet Take 1 tablet (500 mg total) by mouth 2 (two) times daily. Patient not taking: Reported on 01/21/2015 12/28/14   Gregor Hams, MD  traMADol (ULTRAM) 50 MG tablet  Take 1 tablet (50 mg total) by mouth every 6 (six) hours as needed. 01/21/15   Vung Kush, PA-C   BP 143/87 mmHg  Pulse 82  Temp(Src) 97.8 F (36.6 C) (Oral)  Resp 16  Ht 5\' 4"  (1.626 m)  Wt 212 lb (96.163 kg)  BMI 36.37 kg/m2  SpO2 100%  LMP 12/26/2013 Physical Exam  Constitutional: She is oriented to person, place, and time. She appears well-developed and well-nourished. No distress.  HENT:  Head: Normocephalic and atraumatic.  Right Ear: External  ear normal.  Left Ear: External ear normal.  Nose: Nose normal.  Mouth/Throat: Oropharynx is clear and moist. No oropharyngeal exudate.  Eyes: Conjunctivae are normal.  Neck: Neck supple.  Cardiovascular: Normal rate, regular rhythm and normal heart sounds.   Pulmonary/Chest: Effort normal and breath sounds normal.  Abdominal: Soft. Bowel sounds are normal. She exhibits no distension. There is tenderness (suprapubic). There is CVA tenderness. There is no rebound and no guarding.  Musculoskeletal: Normal range of motion.  Neurological: She is alert and oriented to person, place, and time.  Moves all extremities without ataxia.   Skin: Skin is warm and dry. She is not diaphoretic.  Nursing note and vitals reviewed.   ED Course  Procedures (including critical care time) Medications  cephALEXin (KEFLEX) capsule 500 mg (500 mg Oral Given 01/21/15 1402)  traMADol (ULTRAM) tablet 50 mg (50 mg Oral Given 01/21/15 1402)    Labs Review Labs Reviewed  URINALYSIS, ROUTINE W REFLEX MICROSCOPIC - Abnormal; Notable for the following:    Color, Urine ORANGE (*)    Specific Gravity, Urine 1.035 (*)    Bilirubin Urine SMALL (*)    Protein, ur 30 (*)    Nitrite POSITIVE (*)    Leukocytes, UA SMALL (*)    All other components within normal limits  URINE MICROSCOPIC-ADD ON - Abnormal; Notable for the following:    Squamous Epithelial / LPF MANY (*)    Bacteria, UA FEW (*)    All other components within normal limits  URINE CULTURE    Imaging Review No results found.   EKG Interpretation None      MDM   Final diagnoses:  Pyelonephritis    Filed Vitals:   01/21/15 1409  BP: 143/87  Pulse: 82  Temp: 97.8 F (36.6 C)  Resp: 16   Afebrile, NAD, non-toxic appearing, AAOx4.  Pt has been diagnosed with a pyelonephritis. Pt is afebrile, mild R CVA tenderness, normotensive, and denies N/V. Pt to be dc home with antibiotics and instructions to follow up with PCP if symptoms persist.  Patient is stable at time of discharge      Baron Sane, PA-C 01/21/15 [place], MD 01/23/15 1729

## 2015-01-21 NOTE — Discharge Instructions (Signed)
Please follow up with your primary care physician in 1-2 days. If you do not have one please call the [place] number listed above. Please take your antibiotic until completion. Please read all discharge instructions and return precautions.    Pyelonephritis, Adult Pyelonephritis is a kidney infection. In general, there are 2 main types of pyelonephritis:  Infections that come on quickly without any warning (acute pyelonephritis).  Infections that persist for a long period of time (chronic pyelonephritis). CAUSES  Two main causes of pyelonephritis are:  Bacteria traveling from the bladder to the kidney. This is a problem especially in pregnant women. The urine in the bladder can become filled with bacteria from multiple causes, including:  Inflammation of the prostate gland (prostatitis).  Sexual intercourse in females.  Bladder infection (cystitis).  Bacteria traveling from the bloodstream to the tissue part of the kidney. Problems that may increase your risk of getting a kidney infection include:  Diabetes.  Kidney stones or bladder stones.  Cancer.  Catheters placed in the bladder.  Other abnormalities of the kidney or ureter. SYMPTOMS   Abdominal pain.  Pain in the side or flank area.  Fever.  Chills.  Upset stomach.  Blood in the urine (dark urine).  Frequent urination.  Strong or persistent urge to urinate.  Burning or stinging when urinating. DIAGNOSIS  Your caregiver may diagnose your kidney infection based on your symptoms. A urine sample may also be taken. TREATMENT  In general, treatment depends on how severe the infection is.   If the infection is mild and caught early, your caregiver may treat you with oral antibiotics and send you home.  If the infection is more severe, the bacteria may have gotten into the bloodstream. This will require intravenous (IV) antibiotics and a hospital stay. Symptoms may include:  High  fever.  Severe flank pain.  Shaking chills.  Even after a hospital stay, your caregiver may require you to be on oral antibiotics for a period of time.  Other treatments may be required depending upon the cause of the infection. HOME CARE INSTRUCTIONS   Take your antibiotics as directed. Finish them even if you start to feel better.  Make an appointment to have your urine checked to make sure the infection is gone.  Drink enough fluids to keep your urine clear or pale yellow.  Take medicines for the bladder if you have urgency and frequency of urination as directed by your caregiver. SEEK IMMEDIATE MEDICAL CARE IF:   You have a fever or persistent symptoms for more than 2-3 days.  You have a fever and your symptoms suddenly get worse.  You are unable to take your antibiotics or fluids.  You develop shaking chills.  You experience extreme weakness or fainting.  There is no improvement after 2 days of treatment. MAKE SURE YOU:  Understand these instructions.  Will watch your condition.  Will get help right away if you are not doing well or get worse. Document Released: 09/13/2005 Document Revised: 03/14/2012 Document Reviewed: 02/17/2011 [facility] Patient Information 2015 [place], [place]. This information is not intended to replace advice given to you by your health care provider. Make sure you discuss any questions you have with your health care provider.

## 2015-01-23 LAB — URINE CULTURE
COLONY COUNT: NO GROWTH
CULTURE: NO GROWTH

## 2015-02-05 ENCOUNTER — Other Ambulatory Visit: Payer: Self-pay | Admitting: Obstetrics and Gynecology

## 2015-02-06 LAB — CYTOLOGY - PAP

## 2015-03-04 IMAGING — CT CT ABD-PELV W/ CM
1 of 3 series · 14 of 32 positions shown, 19 images · IV contrast (100 ML OMNI 300)
Comparison: CT of the abdomen and pelvis performed 06/13/2012, and
abdominal ultrasound performed 11/05/2012

CLINICAL DATA: Worsening upper abdominal pain, radiating to the
back. Leukocytosis. White blood cells in the urine.

EXAM:
CT ABDOMEN AND PELVIS WITH CONTRAST
TECHNIQUE: Multidetector CT imaging of the abdomen and pelvis was performed
using the standard protocol following bolus administration of
intravenous contrast.
CONTRAST:  100mL OMNIPAQUE IOHEXOL 300 MG/ML  SOLN

[Series 2: abd/pel with · axial · 0.69mm/px · z∈[+1306,+1720]mm · 14 of 93 slices shown, 19 images]
[im 5/93  soft-tissue]
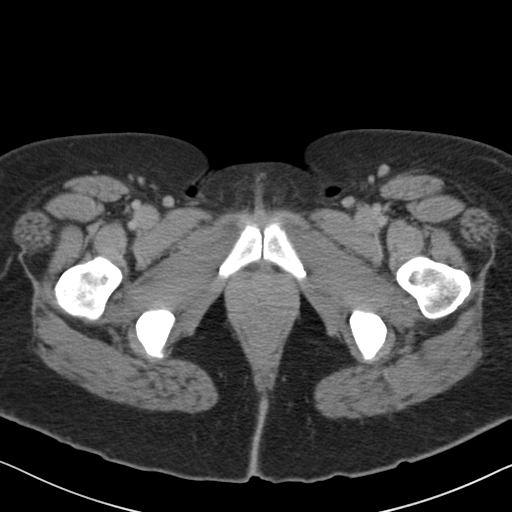
[im 5/93  bone]
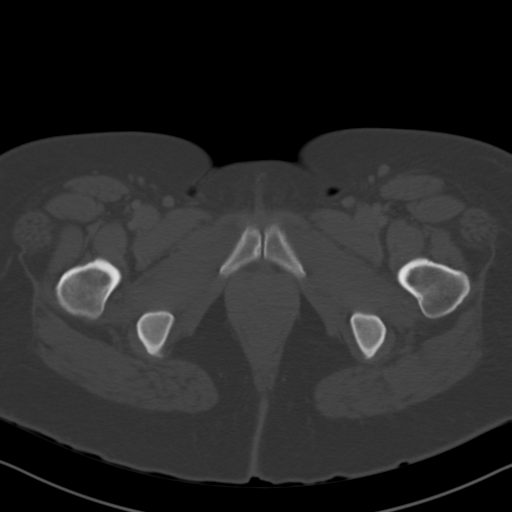
[im 15/93  soft-tissue]
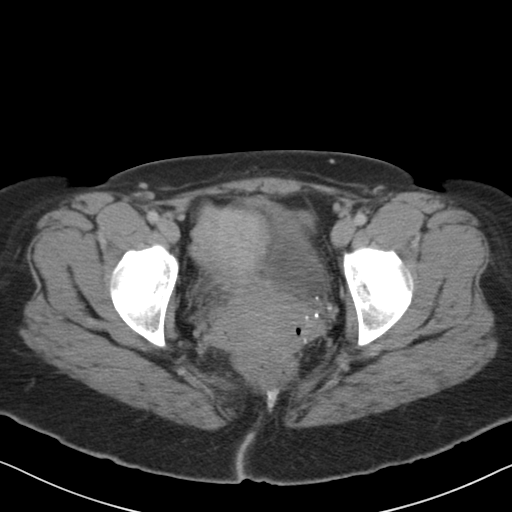
[im 20/93  soft-tissue]
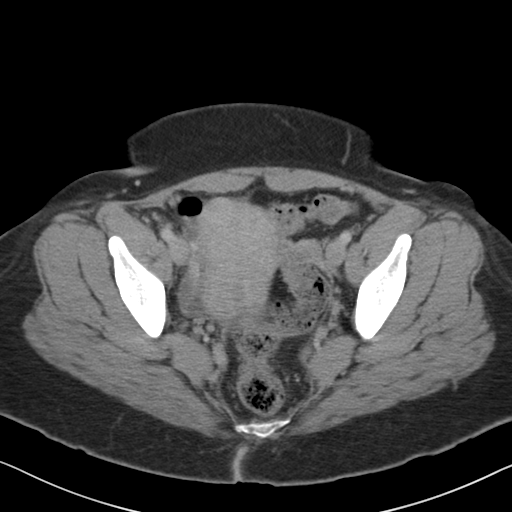
[im 25/93  soft-tissue]
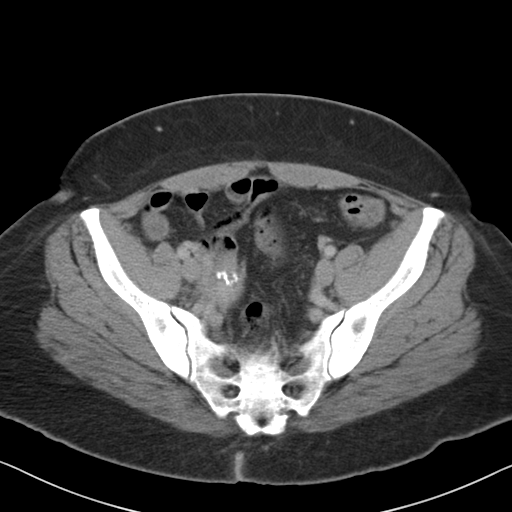
[im 34/93  soft-tissue]
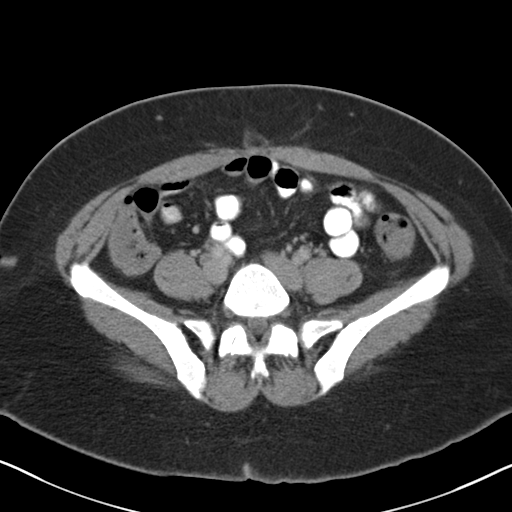
[im 39/93  soft-tissue]
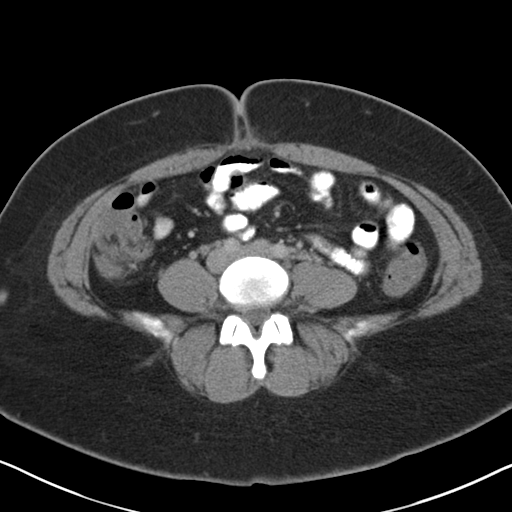
[im 49/93  soft-tissue]
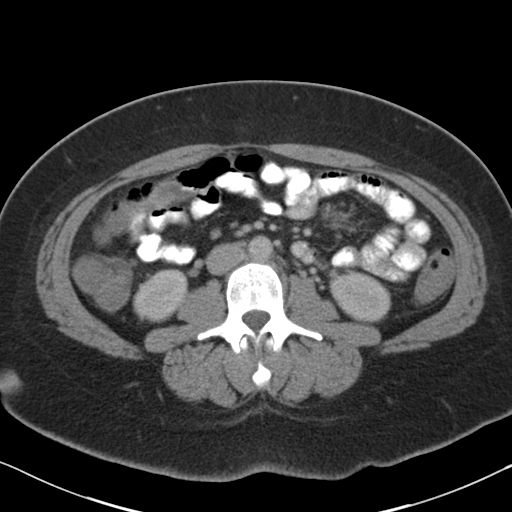
[im 54/93  soft-tissue]
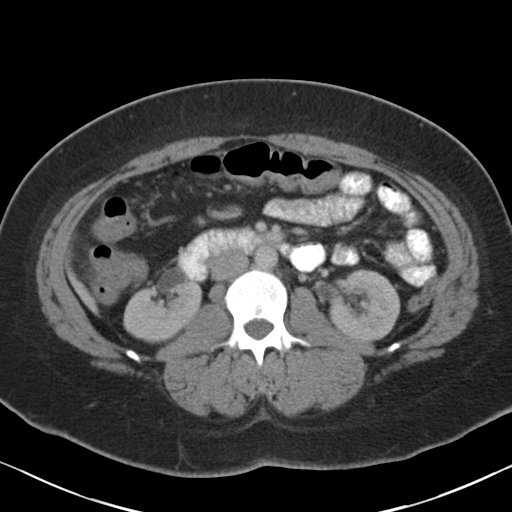
[im 59/93  soft-tissue]
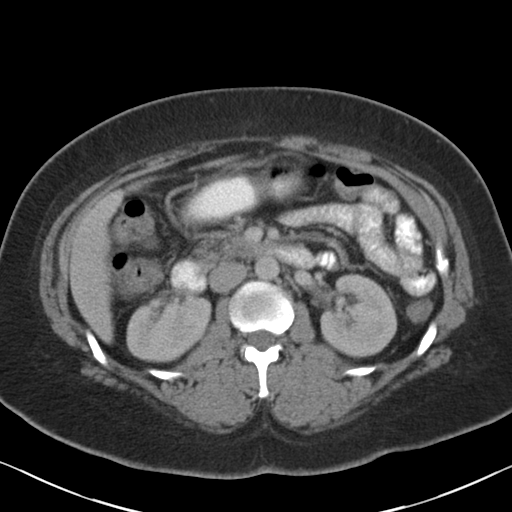
[im 59/93  bone]
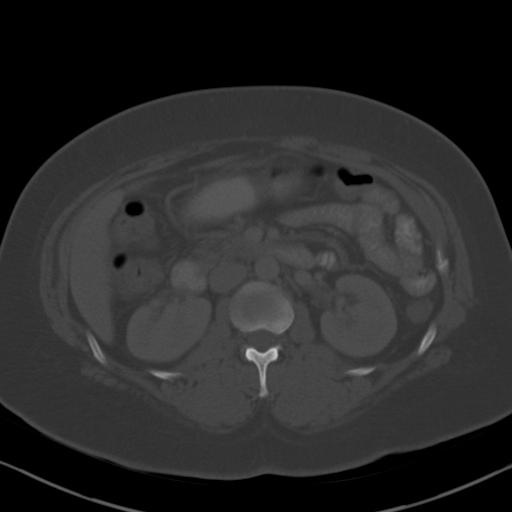
[im 68/93  soft-tissue]
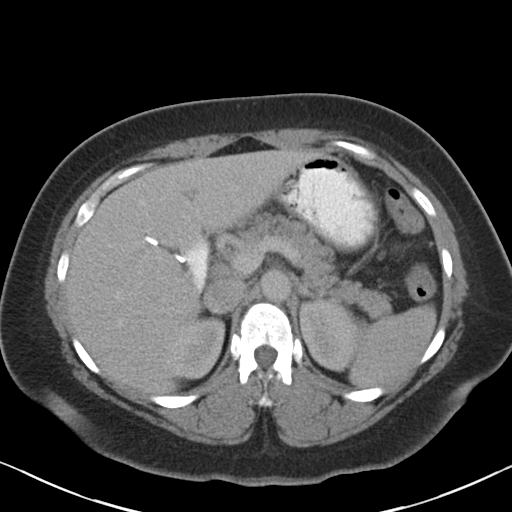
[im 73/93  soft-tissue]
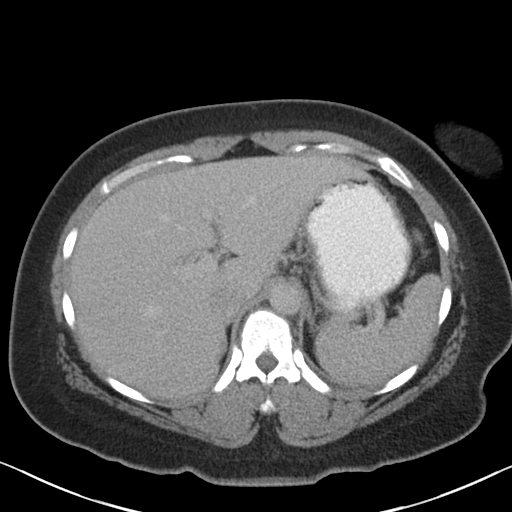
[im 73/93  lung]
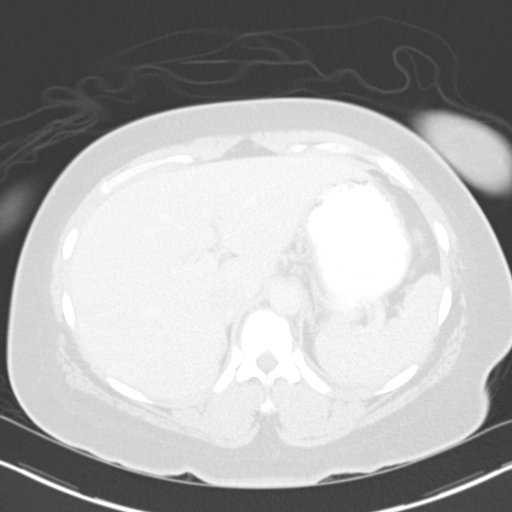
[im 78/93  soft-tissue]
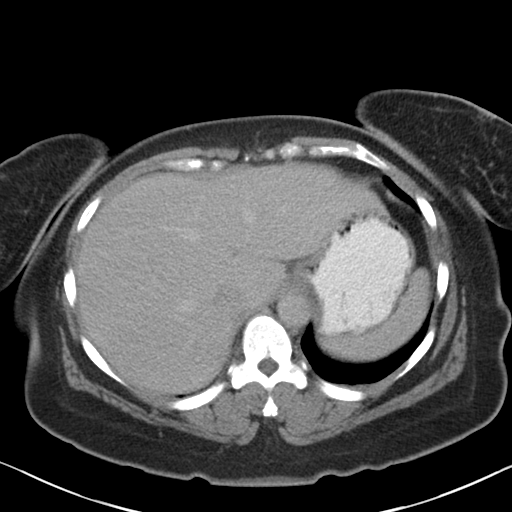
[im 78/93  lung]
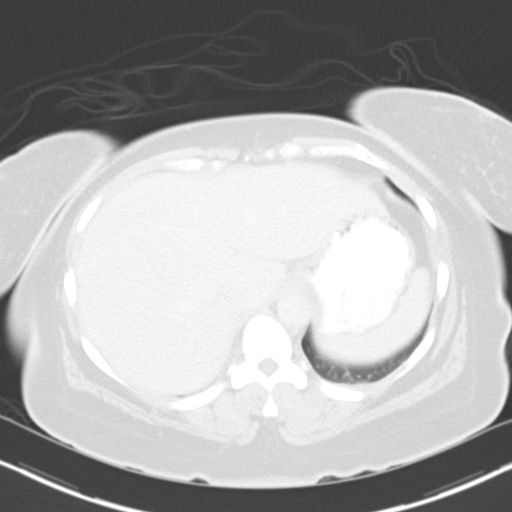
[im 83/93  lung]
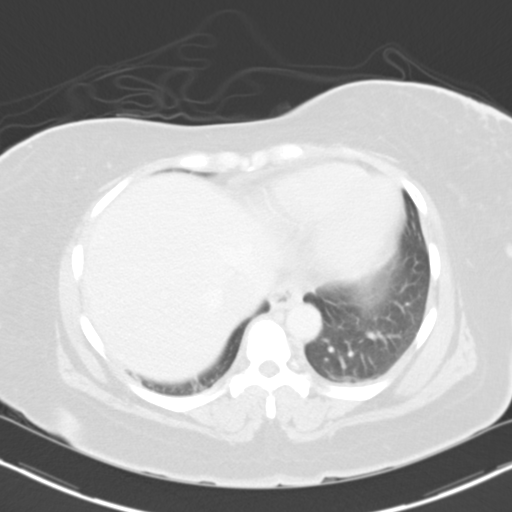
[im 88/93  soft-tissue]
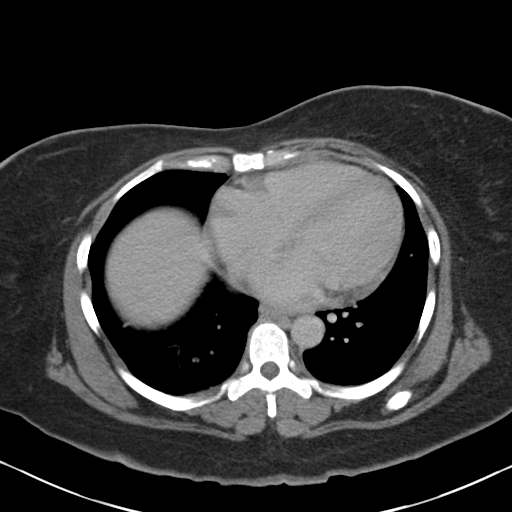
[im 88/93  lung]
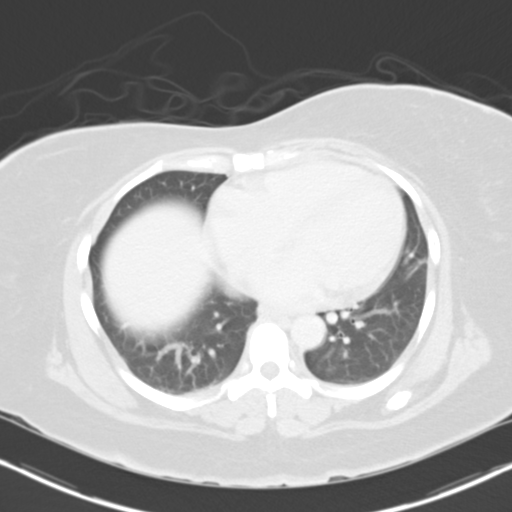

[14 of 32 positions shown; findings below may reference images not displayed]

FINDINGS: The visualized lung bases are clear.

The liver and spleen are unremarkable in appearance. The patient is
status post cholecystectomy, with clips noted along the gallbladder
fossa. The pancreas and adrenal glands are unremarkable.

There is no evidence of hydronephrosis. No renal or ureteral stones
are seen. Note is made of diffuse wall thickening along the proximal
and mid right ureter, raising concern for ureteritis. There is
incomplete rotation of the right kidney. No perinephric stranding is
seen.

No free fluid is identified. The small bowel is unremarkable in
appearance. The stomach is within normal limits. No acute vascular
abnormalities are seen.

The appendix is normal in caliber, without evidence for
appendicitis. The colon is unremarkable in appearance.

The bladder is mildly distended; mild bladder wall thickening may
reflect cystitis, given the patient's symptoms. A few fibroids are
noted within the uterus, one of which demonstrates peripheral
calcification. The ovaries are relatively symmetric; no suspicious
adnexal masses are seen. No inguinal lymphadenopathy is seen.

No acute osseous abnormalities are identified.
IMPRESSION: 1. Diffuse wall thickening along the proximal and mid right ureter,
raising concern for ureteritis.
2. Mild bladder wall thickening may reflect underlying cystitis,
given the patient's symptoms.
3. No evidence of hydronephrosis; no definite evidence of
pyelonephritis at this time.
4. Fibroid uterus noted.

## 2016-03-02 ENCOUNTER — Other Ambulatory Visit: Payer: Self-pay | Admitting: Obstetrics and Gynecology

## 2016-04-15 ENCOUNTER — Encounter (HOSPITAL_COMMUNITY): Payer: Self-pay | Admitting: Emergency Medicine

## 2016-04-15 ENCOUNTER — Ambulatory Visit (HOSPITAL_COMMUNITY)
Admission: EM | Admit: 2016-04-15 | Discharge: 2016-04-15 | Disposition: A | Discharge status: Home / Self Care | Payer: Self-pay | Attending: Emergency Medicine | Admitting: Emergency Medicine

## 2016-04-15 DIAGNOSIS — Z9851 Tubal ligation status: Secondary | ICD-10-CM | POA: Insufficient documentation

## 2016-04-15 DIAGNOSIS — B373 Candidiasis of vulva and vagina: Secondary | ICD-10-CM | POA: Insufficient documentation

## 2016-04-15 DIAGNOSIS — Z79899 Other long term (current) drug therapy: Secondary | ICD-10-CM | POA: Insufficient documentation

## 2016-04-15 DIAGNOSIS — B3731 Acute candidiasis of vulva and vagina: Secondary | ICD-10-CM

## 2016-04-15 DIAGNOSIS — Z9049 Acquired absence of other specified parts of digestive tract: Secondary | ICD-10-CM | POA: Insufficient documentation

## 2016-04-15 LAB — POCT URINALYSIS DIP (DEVICE)
Glucose, UA: NEGATIVE mg/dL
HGB URINE DIPSTICK: NEGATIVE
Ketones, ur: NEGATIVE mg/dL
Nitrite: NEGATIVE
Protein, ur: 30 mg/dL — AB
SPECIFIC GRAVITY, URINE: 1.025 (ref 1.005–1.030)
UROBILINOGEN UA: 0.2 mg/dL (ref 0.0–1.0)
pH: 6 (ref 5.0–8.0)

## 2016-04-15 NOTE — ED Notes (Signed)
Pt reports itching and discharge last week.  She tried monostat, with no relief, pts MD prescribed Diflucan three days ago, but she states she is not getting any better.  She reports a lot of irritation and burning on the outside of her vagina.

## 2016-04-15 NOTE — ED Provider Notes (Signed)
CSN: [id number]     Arrival date & time 04/15/16  1106 History   First MD Initiated Contact with Patient 04/15/16 1140     Chief Complaint  Patient presents with  . Vaginitis   (Consider location/radiation/quality/duration/timing/severity/associated sxs/prior Treatment) HPI History obtained from patient:  Pt presents with the cc of:  Vaginal discharge Duration of symptoms: One week Treatment prior to arrival: 2 courses of antifungal medication including Lamisil and Diflucan Context: Patient states that she has had symptoms of a vaginal yeast infection for about one week now. She has been treated with 2 courses of antifungal medicine but does not feel like her symptoms are getting any better. She states that she attempted to see her primary care today but there were no appointments. She was then urged to come to urgent care. Other symptoms include: Dryness with intercourse Pain score: 0 FAMILY HISTORY: Cancer    Past Medical History  Diagnosis Date  . Hypertension    Past Surgical History  Procedure Laterality Date  . Tubal ligation    . Cholecystectomy     Family History  Problem Relation Age of Onset  . Stomach cancer Mother   . Cancer Mother   . Prostate cancer Father   . Cancer Father   . Breast cancer Maternal Aunt   . Breast cancer Paternal Aunt    Social History  Substance Use Topics  . Smoking status: Never Smoker   . Smokeless tobacco: Never Used  . Alcohol Use: No   OB History    No data available     Review of Systems  Denies: HEADACHE, NAUSEA, ABDOMINAL PAIN, CHEST PAIN, CONGESTION, DYSURIA, SHORTNESS OF BREATH  Allergies  Promethazine-dm  Home Medications   Prior to Admission medications   Medication Sig Start Date End Date Taking? Authorizing Provider  fluconazole (DIFLUCAN) 100 MG tablet Take 100 mg by mouth daily. 04/12/16 04/21/16 Yes Historical Provider, MD  hydrochlorothiazide (HYDRODIURIL) 25 MG tablet Take 1 tablet by mouth daily. 10/21/14   Yes Historical Provider, MD  lisinopril (PRINIVIL,ZESTRIL) 20 MG tablet Take 1 tablet by mouth daily. 10/21/14  Yes Historical Provider, MD  norethindrone-ethinyl estradiol-iron (MICROGESTIN FE,GILDESS FE,LOESTRIN FE) 1.5-30 MG-MCG tablet Take 1 tablet by mouth at bedtime.   Yes Historical Provider, MD  phentermine 37.5 MG capsule Take 37.5 mg by mouth every morning.   Yes Historical Provider, MD  cephALEXin (KEFLEX) 500 MG capsule Take 1 capsule (500 mg total) by mouth 2 (two) times daily. Patient not taking: Reported on 01/21/2015 12/28/14   Gregor Hams, MD  cephALEXin (KEFLEX) 500 MG capsule Take 1 capsule (500 mg total) by mouth 3 (three) times daily. 01/21/15   Jennifer Piepenbrink, PA-C  Cranberry-Vitamin C-Probiotic (AZO CRANBERRY PO) Take 2 tablets by mouth 3 (three) times daily as needed (pain).    Historical Provider, MD  cyanocobalamin 2000 MCG tablet Take 2,000 mcg by mouth daily.    Historical Provider, MD  fluconazole (DIFLUCAN) 150 MG tablet Take 1 tablet (150 mg total) by mouth every 3 (three) days. Patient not taking: Reported on 01/21/2015 12/25/14   Gregor Hams, MD  HYDROcodone-homatropine [facility]) 5-1.5 MG/5ML syrup Take 5 mLs by mouth every 8 (eight) hours as needed for cough. Patient not taking: Reported on 01/21/2015 12/02/14   Araceli Bouche, PA  metroNIDAZOLE (FLAGYL) 500 MG tablet Take 1 tablet (500 mg total) by mouth 2 (two) times daily. Patient not taking: Reported on 01/21/2015 12/28/14   Gregor Hams, MD  traMADol [facility]) 50  MG tablet Take 1 tablet (50 mg total) by mouth every 6 (six) hours as needed. 01/21/15   Baron Sane, PA-C   Meds Ordered and Administered this Visit  Medications - No data to display  BP 146/82 mmHg  Pulse 79  Temp(Src) 98.2 F (36.8 C) (Oral)  Resp 16  SpO2 100%  LMP 12/26/2013 No data found.   Physical Exam NURSES NOTES AND VITAL SIGNS REVIEWED. CONSTITUTIONAL: Well developed, well nourished, no acute distress HEENT: normocephalic,  atraumatic, right and left TM's are normal EYES: Conjunctiva normal NECK:normal ROM, supple, no adenopathy PULMONARY:No respiratory distress, normal effort Exam is performed with patient's permission Female nursing staff present to chaperone.  Perineum: clean, dry without lesions, no groin or inguinal Lymphadenopathy; urethra . No caruncle or prolapse noted.  Vaginal canal: moderate amount thick white adherent discharge noted in canal with loss of rugae. Moderate amount thin homogenous white-yellow discharge in fornix.  Cervix is pink and non-friable.       ED Course  Procedures (including critical care time)  Labs Review Labs Reviewed  POCT URINALYSIS DIP (DEVICE) - Abnormal; Notable for the following:    Bilirubin Urine SMALL (*)    Protein, ur 30 (*)    Leukocytes, UA TRACE (*)    All other components within normal limits  CERVICOVAGINAL ANCILLARY ONLY    Imaging Review No results found.   Visual Acuity Review  Right Eye Distance:   Left Eye Distance:   Bilateral Distance:    Right Eye Near:   Left Eye Near:    Bilateral Near:      Pt is being treated at this time for yeast vaginitis. Suggest continuation of this treatment, await cultures and if treatment needs to be change, can do at that time.    MDM   1. Yeast vaginitis     Patient is reassured that there are no issues that require transfer to higher level of care at this time or additional tests. Patient is advised to continue home symptomatic treatment. Patient is advised that if there are new or worsening symptoms to attend the emergency department, contact primary care provider, or return to UC. Instructions of care provided discharged home in stable condition.    THIS NOTE WAS GENERATED USING A VOICE RECOGNITION SOFTWARE PROGRAM. ALL REASONABLE EFFORTS  WERE MADE TO PROOFREAD THIS DOCUMENT FOR ACCURACY.  I have verbally reviewed the discharge instructions with the patient. A printed AVS was given to  the patient.  All questions were answered prior to discharge.      Konrad Felix, [place] 04/15/16 1217

## 2016-04-15 NOTE — Discharge Instructions (Signed)
Vaginitis Vaginitis is an inflammation of the vagina. It can happen when the normal bacteria and yeast in the vagina grow too much. There are different types. Treatment will depend on the type you have. HOME CARE  Take all medicines as told by your doctor.  Keep your vagina area clean and dry. Avoid soap. Rinse the area with water.  Avoid washing and cleaning out the vagina (douching).  Do not use tampons or have sex (intercourse) until your treatment is done.  Wipe from front to back after going to the restroom.  Wear cotton underwear.  Avoid wearing underwear while you sleep until your vaginitis is gone.  Avoid tight pants. Avoid underwear or nylons without a cotton panel.  Take off wet clothing (such as a bathing suit) as soon as you can.  Use mild, unscented products. Avoid fabric softeners and scented:  Feminine sprays.  Laundry detergents.  Tampons.  Soaps or bubble baths.  Practice safe sex and use condoms. GET HELP RIGHT AWAY IF:   You have belly (abdominal) pain.  You have a fever or lasting symptoms for more than 2-3 days.  You have a fever and your symptoms suddenly get worse. MAKE SURE YOU:   Understand these instructions.  Will watch this condition.  Will get help right away if you are not doing well or get worse.   This information is not intended to replace advice given to you by your health care provider. Make sure you discuss any questions you have with your health care provider.   Document Released: 12/10/2008 Document Revised: 06/07/2012 Document Reviewed: 02/24/2012 Elsevier Interactive Patient Education 2016 [employer]  Monilial Vaginitis Vaginitis in a soreness, swelling and redness (inflammation) of the vagina and vulva. Monilial vaginitis is not a sexually transmitted infection. CAUSES  Yeast vaginitis is caused by yeast (candida) that is normally found in your vagina. With a yeast infection, the candida has overgrown in number to a  point that upsets the chemical balance. SYMPTOMS   White, thick vaginal discharge.  Swelling, itching, redness and irritation of the vagina and possibly the lips of the vagina (vulva).  Burning or painful urination.  Painful intercourse. DIAGNOSIS  Things that may contribute to monilial vaginitis are:  Postmenopausal and virginal states.  Pregnancy.  Infections.  Being tired, sick or stressed, especially if you had monilial vaginitis in the past.  Diabetes. Good control will help lower the chance.  Birth control pills.  Tight fitting garments.  Using bubble bath, feminine sprays, douches or deodorant tampons.  Taking certain medications that kill germs (antibiotics).  Sporadic recurrence can occur if you become ill. TREATMENT  Your caregiver will give you medication.  There are several kinds of anti monilial vaginal creams and suppositories specific for monilial vaginitis. For recurrent yeast infections, use a suppository or cream in the vagina 2 times a week, or as directed.  Anti-monilial or steroid cream for the itching or irritation of the vulva may also be used. Get your caregiver's permission.  Painting the vagina with methylene blue solution may help if the monilial cream does not work.  Eating yogurt may help prevent monilial vaginitis. HOME CARE INSTRUCTIONS   Finish all medication as prescribed.  Do not have sex until treatment is completed or after your caregiver tells you it is okay.  Take warm sitz baths.  Do not douche.  Do not use tampons, especially scented ones.  Wear cotton underwear.  Avoid tight pants and panty hose.  Tell your sexual partner  that you have a yeast infection. They should go to their caregiver if they have symptoms such as mild rash or itching.  Your sexual partner should be treated as well if your infection is difficult to eliminate.  Practice safer sex. Use condoms.  Some vaginal medications cause latex condoms to  fail. Vaginal medications that harm condoms are:  Cleocin cream.  Butoconazole (Femstat).  Terconazole (Terazol) vaginal suppository.  Miconazole (Monistat) (may be purchased over the counter). SEEK MEDICAL CARE IF:   You have a temperature by mouth above 102 F (38.9 C).  The infection is getting worse after 2 days of treatment.  The infection is not getting better after 3 days of treatment.  You develop blisters in or around your vagina.  You develop vaginal bleeding, and it is not your menstrual period.  You have pain when you urinate.  You develop intestinal problems.  You have pain with sexual intercourse.   This information is not intended to replace advice given to you by your health care provider. Make sure you discuss any questions you have with your health care provider.   Document Released: 06/23/2005 Document Revised: 12/06/2011 Document Reviewed: 03/17/2015 Elsevier Interactive Patient Education [employer].

## 2016-04-16 LAB — CERVICOVAGINAL ANCILLARY ONLY: WET PREP (BD AFFIRM): POSITIVE — AB

## 2016-04-18 ENCOUNTER — Other Ambulatory Visit (HOSPITAL_COMMUNITY): Payer: Self-pay | Admitting: Internal Medicine

## 2016-04-18 MED ORDER — METRONIDAZOLE 500 MG PO TABS: 500.0000 mg | ORAL_TABLET | Freq: Two times a day (BID) | ORAL | 0 refills | Status: AC

## 2016-04-18 NOTE — Telephone Encounter (Signed)
Clinical staff, please let patient know that tests for gardnerella (bacterial vaginosis) and candida (yeast) were positive.  Patient was reportedly taking fluconazole at time of UC visit; will add rx for metronidazole and send to [facility]).  Recheck as needed if symptoms persist.  LM

## 2017-11-16 ENCOUNTER — Emergency Department (HOSPITAL_COMMUNITY): Payer: Managed Care, Other (non HMO)

## 2017-11-16 ENCOUNTER — Encounter (HOSPITAL_COMMUNITY): Payer: Self-pay | Admitting: Emergency Medicine

## 2017-11-16 ENCOUNTER — Emergency Department (HOSPITAL_COMMUNITY)
Admission: EM | Admit: 2017-11-16 | Discharge: 2017-11-16 | Disposition: A | Discharge status: Home / Self Care | Payer: Managed Care, Other (non HMO) | Attending: Emergency Medicine | Admitting: Emergency Medicine

## 2017-11-16 DIAGNOSIS — Z79899 Other long term (current) drug therapy: Secondary | ICD-10-CM | POA: Diagnosis not present

## 2017-11-16 DIAGNOSIS — R112 Nausea with vomiting, unspecified: Secondary | ICD-10-CM | POA: Diagnosis not present

## 2017-11-16 DIAGNOSIS — R1084 Generalized abdominal pain: Secondary | ICD-10-CM | POA: Insufficient documentation

## 2017-11-16 LAB — I-STAT BETA HCG BLOOD, ED (MC, WL, AP ONLY)

## 2017-11-16 LAB — COMPREHENSIVE METABOLIC PANEL
ALBUMIN: 3.5 g/dL (ref 3.5–5.0)
ALT: 14 U/L (ref 14–54)
AST: 17 U/L (ref 15–41)
Alkaline Phosphatase: 48 U/L (ref 38–126)
Anion gap: 11 (ref 5–15)
BUN: 14 mg/dL (ref 6–20)
CHLORIDE: 100 mmol/L — AB (ref 101–111)
CO2: 29 mmol/L (ref 22–32)
CREATININE: 0.92 mg/dL (ref 0.44–1.00)
Calcium: 9.5 mg/dL (ref 8.9–10.3)
GFR calc Af Amer: >60 mL/min (ref >60)
Glucose, Bld: 123 mg/dL — ABNORMAL HIGH (ref 65–99)
Potassium: 3.5 mmol/L (ref 3.5–5.1)
SODIUM: 140 mmol/L (ref 135–145)
Total Bilirubin: 0.7 mg/dL (ref 0.3–1.2)
Total Protein: 8 g/dL (ref 6.5–8.1)

## 2017-11-16 LAB — URINALYSIS, ROUTINE W REFLEX MICROSCOPIC
BILIRUBIN URINE: NEGATIVE
Glucose, UA: NEGATIVE mg/dL
Hgb urine dipstick: NEGATIVE
KETONES UR: NEGATIVE mg/dL
LEUKOCYTES UA: NEGATIVE
Nitrite: NEGATIVE
Protein, ur: 30 mg/dL — AB
Specific Gravity, Urine: >1.046 — ABNORMAL HIGH (ref 1.005–1.030)
pH: 5 (ref 5.0–8.0)

## 2017-11-16 LAB — CBC
HEMATOCRIT: 35.5 % — AB (ref 36.0–46.0)
Hemoglobin: 12.1 g/dL (ref 12.0–15.0)
MCH: 28.9 pg (ref 26.0–34.0)
MCHC: 34.1 g/dL (ref 30.0–36.0)
MCV: 84.9 fL (ref 78.0–100.0)
PLATELETS: 362 10*3/uL (ref 150–400)
RBC: 4.18 MIL/uL (ref 3.87–5.11)
RDW: 14 % (ref 11.5–15.5)
WBC: 7 10*3/uL (ref 4.0–10.5)

## 2017-11-16 LAB — LIPASE, BLOOD: Lipase: 24 U/L (ref 11–51)

## 2017-11-16 MED ORDER — IOPAMIDOL (ISOVUE-300) INJECTION 61%
INTRAVENOUS | Status: AC
  Filled 2017-11-16: qty 100

## 2017-11-16 MED ORDER — IOPAMIDOL (ISOVUE-300) INJECTION 61%
100.0000 mL | Freq: Once | INTRAVENOUS | Status: AC | PRN
  Administered 2017-11-16: 100 mL via INTRAVENOUS

## 2017-11-16 MED ORDER — SODIUM CHLORIDE 0.9 % IV BOLUS (SEPSIS)
500.0000 mL | Freq: Once | INTRAVENOUS | Status: AC
  Administered 2017-11-16: 500 mL via INTRAVENOUS

## 2017-11-16 NOTE — ED Triage Notes (Signed)
Patient here from urgent care with complaints of right lower abdominal pain, nausea, vomiting x1 this morning. Denies pregnancy.

## 2017-11-16 NOTE — ED Notes (Signed)
Bed: WA01 Expected date:  Expected time:  Means of arrival:  Comments: 

## 2017-11-16 NOTE — Discharge Instructions (Signed)
There were no signs of appendicitis, or serious intra-abdominal problems.  For the pain, use Tylenol every 4 hours.  Began with a clear liquid diet then gradually advance to regular foods after 1 or 2 days.  Return here, if needed, for problems.

## 2017-11-16 NOTE — ED Provider Notes (Signed)
[facility] DEPT Provider Note   CSN: [id number] Arrival date & time: 11/16/17  1158     History   Chief Complaint Chief Complaint  Patient presents with  . Abdominal Pain  . Nausea  . Emesis    HPI Becky Vazquez is a 47 y.o. female.  She presents for evaluation of abdominal pain which started this morning and has been associated with some nausea and a single episode of vomiting.  She denies diarrhea, fever, weakness or dizziness.  She went to an urgent care earlier today and was instructed to come here "to get a CT scan."  She denies cough or shortness of breath.  She is taking her usual medications.  There are no other known modifying factors.  HPI  Past Medical History:  Diagnosis Date  . Hypertension     Patient Active Problem List   Diagnosis Date Noted  . Allergic rhinitis 12/02/2014  . Vaginal discharge 05/11/2013  . Dysuria 05/11/2013  . Abdominal pain, acute 06/14/2012  . Enteritis 06/14/2012  . Dehydration 06/14/2012  . Nausea & vomiting 06/14/2012  . Constipation 06/14/2012  . FATIGUE 03/03/2010  . VAGINITIS 10/16/2009  . IMPAIRED GLUCOSE TOLERANCE 01/09/2008  . ANEMIA, IRON DEFICIENCY 01/09/2008  . LEIOMYOMA, UTERUS 02/07/2007  . HYPERTENSION, BENIGN 12/28/2006  . OBESITY, NOS 11/24/2006    Past Surgical History:  Procedure Laterality Date  . CHOLECYSTECTOMY    . TUBAL LIGATION      OB History    No data available       Home Medications    Prior to Admission medications   Medication Sig Start Date End Date Taking? Authorizing Provider  atenolol (TENORMIN) 25 MG tablet Take 25 mg by mouth daily.   Yes [provider]  BIOTIN PO Take 1 tablet by mouth daily.   Yes [provider]  cyanocobalamin 2000 MCG tablet Take 2,000 mcg by mouth daily.   Yes [provider]  fluticasone (FLONASE) 50 MCG/ACT nasal spray 2 sprays.   Yes [provider]  hydrochlorothiazide  (HYDRODIURIL) 25 MG tablet Take 1 tablet by mouth daily. 10/21/14  Yes [provider]  JINTELI 1-5 MG-MCG TABS tablet Take 1 tablet by mouth daily. 11/05/17  Yes [provider]  liothyronine (CYTOMEL) 25 MCG tablet Take 25 mcg by mouth daily. 11/01/17  Yes [provider]  lisinopril (PRINIVIL,ZESTRIL) 20 MG tablet Take 1 tablet by mouth daily. 10/21/14  Yes [provider]  Multiple Vitamins-Minerals (WOMENS DAILY FORMULA PO) Take 1 tablet by mouth daily.   Yes [provider]  phentermine 37.5 MG capsule Take 37.5 mg by mouth every morning.   Yes [provider]  cephALEXin (KEFLEX) 500 MG capsule Take 1 capsule (500 mg total) by mouth 2 (two) times daily. Patient not taking: Reported on 11/16/2017 12/28/14   Gregor Hams, MD  cephALEXin (KEFLEX) 500 MG capsule Take 1 capsule (500 mg total) by mouth 3 (three) times daily. Patient not taking: Reported on 11/16/2017 01/21/15   Piepenbrink, Anderson Malta, PA-C  fluconazole (DIFLUCAN) 150 MG tablet Take 1 tablet (150 mg total) by mouth every 3 (three) days. Patient not taking: Reported on 11/16/2017 12/25/14   Gregor Hams, MD  HYDROcodone-homatropine [facility]) 5-1.5 MG/5ML syrup Take 5 mLs by mouth every 8 (eight) hours as needed for cough. Patient not taking: Reported on 11/16/2017 12/02/14   Araceli Bouche, PA  traMADol (ULTRAM) 50 MG tablet Take 1 tablet (50 mg total) by mouth every 6 (  six) hours as needed. Patient not taking: Reported on 11/16/2017 01/21/15   Baron Sane, PA-C    Family History Family History  Problem Relation Age of Onset  . Stomach cancer Mother   . Cancer Mother   . Prostate cancer Father   . Cancer Father   . Breast cancer Maternal Aunt   . Breast cancer Paternal Aunt     Social History Social History   Tobacco Use  . Smoking status: Never Smoker  . Smokeless tobacco: Never Used  Substance Use Topics  . Alcohol use: No  . Drug use: No     Allergies     Promethazine-dm   Review of Systems Review of Systems  All other systems reviewed and are negative.    Physical Exam Updated Vital Signs BP 136/78 (BP Location: Right Arm)   Pulse 74   Temp 98.2 F (36.8 C) (Oral)   Resp 17   Ht 5\' 4"  (1.626 m)   Wt 105.2 kg (232 lb)   LMP 02/28/2014   SpO2 100%   BMI 39.82 kg/m   Physical Exam  Constitutional: She is oriented to person, place, and time. She appears well-developed and well-nourished. She does not appear ill.  HENT:  Head: Normocephalic and atraumatic.  Eyes: Conjunctivae and EOM are normal. Pupils are equal, round, and reactive to light.  Neck: Normal range of motion and phonation normal. Neck supple.  Cardiovascular: Normal rate and regular rhythm.  Pulmonary/Chest: Effort normal and breath sounds normal. She exhibits no tenderness.  Abdominal: Soft. She exhibits no distension. There is generalized tenderness (Mild diffuse abdominal tenderness with increased tenderness in the right lower quadrant.). There is no rigidity, no rebound and no guarding. No hernia.  Musculoskeletal: Normal range of motion.  Neurological: She is alert and oriented to person, place, and time. She exhibits normal muscle tone.  Skin: Skin is warm and dry.  Psychiatric: She has a normal mood and affect. Her behavior is normal. Judgment and thought content normal.  Nursing note and vitals reviewed.    ED Treatments / Results  Labs (all labs ordered are listed, but only abnormal results are displayed) Labs Reviewed  COMPREHENSIVE METABOLIC PANEL - Abnormal; Notable for the following components:      Result Value   Chloride 100 (*)    Glucose, Bld 123 (*)    All other components within normal limits  CBC - Abnormal; Notable for the following components:   HCT 35.5 (*)    All other components within normal limits  LIPASE, BLOOD  URINALYSIS, ROUTINE W REFLEX MICROSCOPIC  I-STAT BETA HCG BLOOD, ED ([facility], [facility], [facility] ONLY)    EKG  EKG  Interpretation None       Radiology Ct Abdomen Pelvis W Contrast  Result Date: 11/16/2017 CLINICAL DATA:  Right lower quadrant abdominal pain, nausea and vomiting since this morning. EXAM: CT ABDOMEN AND PELVIS WITH CONTRAST TECHNIQUE: Multidetector CT imaging of the abdomen and pelvis was performed using the standard protocol following bolus administration of intravenous contrast. CONTRAST:  160mL ISOVUE-300 IOPAMIDOL (ISOVUE-300) INJECTION 61% COMPARISON:  CT scan 03/11/2014 FINDINGS: Lower chest: The lung bases are clear of an acute process. No worrisome pulmonary lesions or pleural effusion. The heart is borderline enlarged for age. No pericardial effusion. Hepatobiliary: No focal hepatic lesions or intrahepatic biliary dilatation. The gallbladder surgically absent. No common bile duct dilatation. Pancreas: No mass, inflammation or ductal dilatation. Spleen: Normal size.  No focal lesions. Adrenals/Urinary Tract: The adrenal glands and kidneys  are unremarkable. No renal, ureteral or bladder calculi or mass. Stomach/Bowel: The stomach, duodenum, small bowel and colon are grossly normal without oral contrast. No acute inflammatory changes, mass lesions or obstructive findings. The terminal ileum is normal. The appendix is normal. Vascular/Lymphatic: The aorta is normal in caliber. No atheroscerlotic calcifications. No mesenteric of retroperitoneal mass or adenopathy. Small scattered lymph nodes are noted. Reproductive: Enlarged fibroid uterus. The largest fibroid is in the lower uterine segment region posteriorly and demonstrates degeneration and calcification. The endometrial stripe appears to be grossly normal. Both ovaries are unremarkable. Other: No pelvic mass or adenopathy. No free pelvic fluid collections. No inguinal mass or adenopathy. No abdominal wall hernia or subcutaneous lesions. Musculoskeletal: No significant bony findings. IMPRESSION: 1. No acute abdominal/pelvic findings, mass lesions or  adenopathy. 2. The appendix and right ovary are unremarkable. 3. Fibroid uterus. 4. Status post cholecystectomy.  No biliary dilatation. Electronically Signed   By: Marijo Sanes M.D.   On: 11/16/2017 14:37    Procedures Procedures (including critical care time)  Medications Ordered in ED Medications  sodium chloride 0.9 % bolus 500 mL (0 mLs Intravenous Stopped 11/16/17 1450)  iopamidol (ISOVUE-300) 61 % injection 100 mL ( Intravenous Canceled Entry 11/16/17 1451)     Initial Impression / Assessment and Plan / ED Course  I have reviewed the triage vital signs and the nursing notes.  Pertinent labs & imaging results that were available during my care of the patient were reviewed by me and considered in my medical decision making (see chart for details).  Clinical Course as of Nov 16 1606  Wed Nov 16, 2017  1328 Abdominal pain is somewhat concerning for appendicitis, CT scan ordered.  [EW]    Clinical Course User Index [EW] Daleen Bo, MD     Patient Vitals for the past 24 hrs:  BP Temp Temp src Pulse Resp SpO2 Height Weight  11/16/17 1558 136/78 98.2 F (36.8 C) Oral 74 17 100 % - -  11/16/17 1449 (!) 142/75 97.8 F (36.6 C) Oral 71 16 100 % - -  11/16/17 1205 (!) 152/91 98.3 F (36.8 C) Oral 88 16 99 % 5\' 4"  (1.626 m) 105.2 kg (232 lb)    4:08 PM Reevaluation with update and discussion. After initial assessment and treatment, an updated evaluation reveals she is comfortable.  She denies having had any urine symptoms including dysuria, urinary frequency or hematuria.  Findings discussed and questions answered. Daleen Bo      Final Clinical Impressions(s) / ED Diagnoses   Final diagnoses:  Generalized abdominal pain   Nonspecific abdominal pain, reassuring evaluation here.  CBC, complete metabolic panel, lipase and pregnancy testing all are normal. CT abdomen done to evaluate for intra-abdominal pathology including appendicitis was negative.  Nursing Notes  Reviewed/ Care Coordinated Applicable Imaging Reviewed Interpretation of Laboratory Data incorporated into ED treatment  The patient appears reasonably screened and/or stabilized for discharge and I doubt any other medical condition or other [facility] requiring further screening, evaluation, or treatment in the ED at this time prior to discharge.  Plan: Home Medications-APAP for pain; Home Treatments-rest, fluids, gradually advance diet; return here if the recommended treatment, does not improve the symptoms; Recommended follow up-PCP, as needed   ED Discharge Orders    None       Daleen Bo, MD 11/16/17 [phone]

## 2017-11-17 ENCOUNTER — Encounter (HOSPITAL_COMMUNITY): Payer: Self-pay

## 2017-11-17 ENCOUNTER — Other Ambulatory Visit: Payer: Self-pay

## 2017-11-17 ENCOUNTER — Emergency Department (HOSPITAL_COMMUNITY)
Admission: EM | Admit: 2017-11-17 | Discharge: 2017-11-18 | Disposition: A | Discharge status: Home / Self Care | Payer: Managed Care, Other (non HMO) | Attending: Emergency Medicine | Admitting: Emergency Medicine

## 2017-11-17 DIAGNOSIS — I10 Essential (primary) hypertension: Secondary | ICD-10-CM | POA: Insufficient documentation

## 2017-11-17 DIAGNOSIS — R112 Nausea with vomiting, unspecified: Secondary | ICD-10-CM | POA: Insufficient documentation

## 2017-11-17 DIAGNOSIS — R1013 Epigastric pain: Secondary | ICD-10-CM

## 2017-11-17 DIAGNOSIS — R1115 Cyclical vomiting syndrome unrelated to migraine: Secondary | ICD-10-CM

## 2017-11-17 DIAGNOSIS — Z79899 Other long term (current) drug therapy: Secondary | ICD-10-CM | POA: Diagnosis not present

## 2017-11-17 NOTE — ED Triage Notes (Signed)
Pt endorses RUQ abd pain and right flank pain x 3 days. Pt went to [facility] and had a CT scan completed yesterday and was told her CT was negative. Pt had episode of vomiting and diarrhea around 1600 this afternoon. VSS.

## 2017-11-18 LAB — CBC WITH DIFFERENTIAL/PLATELET
Basophils Absolute: 0 10*3/uL (ref 0.0–0.1)
Basophils Relative: 0 %
EOS ABS: 0.1 10*3/uL (ref 0.0–0.7)
EOS PCT: 1 %
HCT: 33 % — ABNORMAL LOW (ref 36.0–46.0)
Hemoglobin: 11 g/dL — ABNORMAL LOW (ref 12.0–15.0)
LYMPHS ABS: 2.6 10*3/uL (ref 0.7–4.0)
Lymphocytes Relative: 33 %
MCH: 28.6 pg (ref 26.0–34.0)
MCHC: 33.3 g/dL (ref 30.0–36.0)
MCV: 85.7 fL (ref 78.0–100.0)
MONO ABS: 0.6 10*3/uL (ref 0.1–1.0)
MONOS PCT: 7 %
Neutro Abs: 4.7 10*3/uL (ref 1.7–7.7)
Neutrophils Relative %: 59 %
Platelets: 364 10*3/uL (ref 150–400)
RBC: 3.85 MIL/uL — ABNORMAL LOW (ref 3.87–5.11)
RDW: 13.9 % (ref 11.5–15.5)
WBC: 8 10*3/uL (ref 4.0–10.5)

## 2017-11-18 LAB — URINALYSIS, ROUTINE W REFLEX MICROSCOPIC
BILIRUBIN URINE: NEGATIVE
GLUCOSE, UA: NEGATIVE mg/dL
HGB URINE DIPSTICK: NEGATIVE
Ketones, ur: NEGATIVE mg/dL
Leukocytes, UA: NEGATIVE
Nitrite: NEGATIVE
PROTEIN: NEGATIVE mg/dL
Specific Gravity, Urine: 1.023 (ref 1.005–1.030)
pH: 7 (ref 5.0–8.0)

## 2017-11-18 LAB — COMPREHENSIVE METABOLIC PANEL
ALK PHOS: 45 U/L (ref 38–126)
ALT: 11 U/L — AB (ref 14–54)
AST: 15 U/L (ref 15–41)
Albumin: 3.2 g/dL — ABNORMAL LOW (ref 3.5–5.0)
Anion gap: 9 (ref 5–15)
BUN: 8 mg/dL (ref 6–20)
CO2: 24 mmol/L (ref 22–32)
CREATININE: 0.8 mg/dL (ref 0.44–1.00)
Calcium: 8.9 mg/dL (ref 8.9–10.3)
Chloride: 103 mmol/L (ref 101–111)
GFR calc non Af Amer: >60 mL/min (ref >60)
GLUCOSE: 133 mg/dL — AB (ref 65–99)
Potassium: 3.6 mmol/L (ref 3.5–5.1)
SODIUM: 136 mmol/L (ref 135–145)
Total Bilirubin: 0.2 mg/dL — ABNORMAL LOW (ref 0.3–1.2)
Total Protein: 6.7 g/dL (ref 6.5–8.1)

## 2017-11-18 LAB — TROPONIN I: Troponin I: <0.03 ng/mL (ref <0.03)

## 2017-11-18 LAB — LIPASE, BLOOD: Lipase: 26 U/L (ref 11–51)

## 2017-11-18 MED ORDER — FENTANYL CITRATE (PF) 100 MCG/2ML IJ SOLN
100.0000 ug | Freq: Once | INTRAMUSCULAR | Status: AC
  Administered 2017-11-18: 100 ug via INTRAVENOUS
  Filled 2017-11-18: qty 2

## 2017-11-18 MED ORDER — OMEPRAZOLE 20 MG PO CPDR: 20.0000 mg | DELAYED_RELEASE_CAPSULE | Freq: Every day | ORAL | 0 refills | Status: DC

## 2017-11-18 MED ORDER — SODIUM CHLORIDE 0.9 % IV BOLUS (SEPSIS)
1000.0000 mL | Freq: Once | INTRAVENOUS | Status: AC
Start: 2017-11-18 — End: 2017-11-18
  Administered 2017-11-18: 1000 mL via INTRAVENOUS

## 2017-11-18 MED ORDER — ONDANSETRON HCL 4 MG/2ML IJ SOLN
4.0000 mg | Freq: Once | INTRAMUSCULAR | Status: AC
  Administered 2017-11-18: 4 mg via INTRAVENOUS
  Filled 2017-11-18: qty 2

## 2017-11-18 MED ORDER — MORPHINE SULFATE (PF) 4 MG/ML IV SOLN
4.0000 mg | Freq: Once | INTRAVENOUS | Status: AC
  Administered 2017-11-18: 4 mg via INTRAVENOUS
  Filled 2017-11-18: qty 1

## 2017-11-18 MED ORDER — PANTOPRAZOLE SODIUM 40 MG IV SOLR
40.0000 mg | Freq: Once | INTRAVENOUS | Status: AC
  Administered 2017-11-18: 40 mg via INTRAVENOUS
  Filled 2017-11-18: qty 40

## 2017-11-18 MED ORDER — SODIUM CHLORIDE 0.9 % IV BOLUS (SEPSIS)
1000.0000 mL | Freq: Once | INTRAVENOUS | Status: AC
  Administered 2017-11-18: 1000 mL via INTRAVENOUS

## 2017-11-18 NOTE — ED Notes (Signed)
ED Provider at bedside. 

## 2017-11-18 NOTE — Discharge Instructions (Signed)

## 2017-11-18 NOTE — ED Notes (Signed)
Pt verbalizes understanding of d/c instructions. Pt received prescriptions. Pt ambulatory at d/c with all belongings.  

## 2017-11-18 NOTE — ED Provider Notes (Signed)
[facility] EMERGENCY DEPARTMENT Provider Note   CSN: [id number] Arrival date & time: 11/17/17  1743     History   Chief Complaint Chief Complaint  Patient presents with  . Abdominal Pain    HPI Becky Vazquez is a 47 y.o. female.  The history is provided by the patient.  Abdominal Pain   This is a new problem. The current episode started 6 to 12 hours ago. The problem occurs constantly. The problem has been gradually worsening. The pain is located in the epigastric region. The pain is moderate. Associated symptoms include diarrhea, nausea and vomiting. Pertinent negatives include fever. The symptoms are aggravated by palpation. Nothing relieves the symptoms.  Patient reports onset of epigastric abdominal pain several hours ago.  She also reports nausea/vomiting, and 2 loose stools.  No fevers.  She reports at times the abdominal pain will go up into her chest.  No fevers or cough.  She was seen in the emergency department on February 20 for abdominal pain, had a CT imaging that revealed fibroids only.  Patient reports the pain at this time is more upper abdomen, previous ED visit she had right lower quadrant tenderness  Past Medical History:  Diagnosis Date  . Hypertension     Patient Active Problem List   Diagnosis Date Noted  . Allergic rhinitis 12/02/2014  . Vaginal discharge 05/11/2013  . Dysuria 05/11/2013  . Abdominal pain, acute 06/14/2012  . Enteritis 06/14/2012  . Dehydration 06/14/2012  . Nausea & vomiting 06/14/2012  . Constipation 06/14/2012  . FATIGUE 03/03/2010  . VAGINITIS 10/16/2009  . IMPAIRED GLUCOSE TOLERANCE 01/09/2008  . ANEMIA, IRON DEFICIENCY 01/09/2008  . LEIOMYOMA, UTERUS 02/07/2007  . HYPERTENSION, BENIGN 12/28/2006  . OBESITY, NOS 11/24/2006    Past Surgical History:  Procedure Laterality Date  . CHOLECYSTECTOMY    . TUBAL LIGATION      OB History    No data available       Home Medications    Prior to  Admission medications   Medication Sig Start Date End Date Taking? Authorizing Provider  atenolol (TENORMIN) 25 MG tablet Take 25 mg by mouth daily.    [provider]  BIOTIN PO Take 1 tablet by mouth daily.    [provider]  cyanocobalamin 2000 MCG tablet Take 2,000 mcg by mouth daily.    [provider]  fluconazole (DIFLUCAN) 150 MG tablet Take 1 tablet (150 mg total) by mouth every 3 (three) days. Patient not taking: Reported on 11/16/2017 12/25/14   Gregor Hams, MD  fluticasone [facility]) 50 MCG/ACT nasal spray 2 sprays.    [provider]  hydrochlorothiazide (HYDRODIURIL) 25 MG tablet Take 1 tablet by mouth daily. 10/21/14   [provider]  JINTELI 1-5 MG-MCG TABS tablet Take 1 tablet by mouth daily. 11/05/17   [provider]  liothyronine (CYTOMEL) 25 MCG tablet Take 25 mcg by mouth daily. 11/01/17   [provider]  lisinopril (PRINIVIL,ZESTRIL) 20 MG tablet Take 1 tablet by mouth daily. 10/21/14   [provider]  Multiple Vitamins-Minerals (WOMENS DAILY FORMULA PO) Take 1 tablet by mouth daily.    [provider]  phentermine 37.5 MG capsule Take 37.5 mg by mouth every morning.    [provider]    Family History Family History  Problem Relation Age of Onset  . Stomach cancer Mother   . Cancer Mother   . Prostate cancer Father   . Cancer Father   .  Breast cancer Maternal Aunt   . Breast cancer Paternal Aunt     Social History Social History   Tobacco Use  . Smoking status: Never Smoker  . Smokeless tobacco: Never Used  Substance Use Topics  . Alcohol use: No  . Drug use: No     Allergies   Promethazine-dm   Review of Systems Review of Systems  Constitutional: Negative for fever.  Respiratory: Negative for cough.   Gastrointestinal: Positive for abdominal pain, diarrhea, nausea and vomiting.  All other systems reviewed and are negative.    Physical Exam Updated Vital  Signs BP 120/62 (BP Location: Right Arm)   Pulse 66   Temp 98.2 F (36.8 C) (Oral)   Resp 15   Ht 1.626 m (5\' 4" )   Wt 105.2 kg (232 lb)   LMP 02/28/2014   SpO2 100%   BMI 39.82 kg/m   Physical Exam CONSTITUTIONAL: Well developed/well nourished HEAD: Normocephalic/atraumatic EYES: EOMI/PERRL, no icterus ENMT: Mucous membranes moist NECK: supple no meningeal signs SPINE/BACK:entire spine nontender CV: S1/S2 noted, no murmurs/rubs/gallops noted LUNGS: Lungs are clear to auscultation bilaterally, no apparent distress ABDOMEN: soft, moderate epigastric tenderness, no rebound or guarding, bowel sounds noted throughout abdomen GU:no cva tenderness NEURO: Pt is awake/alert/appropriate, moves all extremitiesx4.  No facial droop.   EXTREMITIES: pulses normal/equal, full ROM SKIN: warm, color normal PSYCH: no abnormalities of mood noted, alert and oriented to situation   ED Treatments / Results  Labs (all labs ordered are listed, but only abnormal results are displayed) Labs Reviewed  COMPREHENSIVE METABOLIC PANEL - Abnormal; Notable for the following components:      Result Value   Glucose, Bld 133 (*)    Albumin 3.2 (*)    ALT 11 (*)    Total Bilirubin 0.2 (*)    All other components within normal limits  CBC WITH DIFFERENTIAL/PLATELET - Abnormal; Notable for the following components:   RBC 3.85 (*)    Hemoglobin 11.0 (*)    HCT 33.0 (*)    All other components within normal limits  LIPASE, BLOOD  URINALYSIS, ROUTINE W REFLEX MICROSCOPIC  TROPONIN I    EKG  EKG Interpretation  Date/Time:  Friday November 18 2017 00:54:55 EST Ventricular Rate:  58 PR Interval:    QRS Duration: 95 QT Interval:  427 QTC Calculation: 420 R Axis:   69 Text Interpretation:  Sinus rhythm No previous ECGs available Confirmed by Ripley Fraise [phone]) on 11/18/2017 12:59:35 AM       Radiology Ct Abdomen Pelvis W Contrast  Result Date: 11/16/2017 CLINICAL DATA:  Right lower  quadrant abdominal pain, nausea and vomiting since this morning. EXAM: CT ABDOMEN AND PELVIS WITH CONTRAST TECHNIQUE: Multidetector CT imaging of the abdomen and pelvis was performed using the standard protocol following bolus administration of intravenous contrast. CONTRAST:  16mL ISOVUE-300 IOPAMIDOL (ISOVUE-300) INJECTION 61% COMPARISON:  CT scan 03/11/2014 FINDINGS: Lower chest: The lung bases are clear of an acute process. No worrisome pulmonary lesions or pleural effusion. The heart is borderline enlarged for age. No pericardial effusion. Hepatobiliary: No focal hepatic lesions or intrahepatic biliary dilatation. The gallbladder surgically absent. No common bile duct dilatation. Pancreas: No mass, inflammation or ductal dilatation. Spleen: Normal size.  No focal lesions. Adrenals/Urinary Tract: The adrenal glands and kidneys are unremarkable. No renal, ureteral or bladder calculi or mass. Stomach/Bowel: The stomach, duodenum, small bowel and colon are grossly normal without oral contrast. No acute inflammatory changes, mass lesions or obstructive findings. The terminal ileum  is normal. The appendix is normal. Vascular/Lymphatic: The aorta is normal in caliber. No atheroscerlotic calcifications. No mesenteric of retroperitoneal mass or adenopathy. Small scattered lymph nodes are noted. Reproductive: Enlarged fibroid uterus. The largest fibroid is in the lower uterine segment region posteriorly and demonstrates degeneration and calcification. The endometrial stripe appears to be grossly normal. Both ovaries are unremarkable. Other: No pelvic mass or adenopathy. No free pelvic fluid collections. No inguinal mass or adenopathy. No abdominal wall hernia or subcutaneous lesions. Musculoskeletal: No significant bony findings. IMPRESSION: 1. No acute abdominal/pelvic findings, mass lesions or adenopathy. 2. The appendix and right ovary are unremarkable. 3. Fibroid uterus. 4. Status post cholecystectomy.  No biliary  dilatation. Electronically Signed   By: Marijo Sanes M.D.   On: 11/16/2017 14:37    Procedures Procedures (including critical care time)  Medications Ordered in ED Medications  sodium chloride 0.9 % bolus 1,000 mL (0 mLs Intravenous Stopped 11/18/17 0200)  ondansetron (ZOFRAN) injection 4 mg (4 mg Intravenous Given 11/18/17 0130)  fentaNYL (SUBLIMAZE) injection 100 mcg (100 mcg Intravenous Given 11/18/17 0130)  sodium chloride 0.9 % bolus 1,000 mL (0 mLs Intravenous Stopped 11/18/17 0345)  morphine 4 MG/ML injection 4 mg (4 mg Intravenous Given 11/18/17 0253)  pantoprazole (PROTONIX) injection 40 mg (40 mg Intravenous Given 11/18/17 0345)     Initial Impression / Assessment and Plan / ED Course  I have reviewed the triage vital signs and the nursing notes.  Pertinent labs  results that were available during my care of the patient were reviewed by me and considered in my medical decision making (see chart for details).     Patient monitored for several hours.  She is improved.  She reports sitting up  And she felt improved.  She feels like she may need to go back on Nexium or Prilosec.  Labs reassuring.  She has had a CT scan on 2/20 that  was unremarkable except for fibroid uterus. We will will discharge home  Final Clinical Impressions(s) / ED Diagnoses   Final diagnoses:  Epigastric pain  Non-intractable cyclical vomiting with nausea    ED Discharge Orders        Ordered    omeprazole (PRILOSEC) 20 MG capsule  Daily     11/18/17 0548       Ripley Fraise, MD 11/18/17 [phone]

## 2017-11-18 NOTE — ED Notes (Signed)
Main lab contacted about add on troponin

## 2017-11-20 ENCOUNTER — Inpatient Hospital Stay (HOSPITAL_COMMUNITY)
Admission: EM | Admit: 2017-11-20 | Discharge: 2017-11-22 | DRG: 439 | Disposition: A | Discharge status: Home / Self Care | Payer: Managed Care, Other (non HMO) | Attending: Internal Medicine | Admitting: Internal Medicine

## 2017-11-20 ENCOUNTER — Encounter (HOSPITAL_COMMUNITY): Payer: Self-pay | Admitting: Nurse Practitioner

## 2017-11-20 DIAGNOSIS — Z800 Family history of malignant neoplasm of digestive organs: Secondary | ICD-10-CM | POA: Diagnosis not present

## 2017-11-20 DIAGNOSIS — F40240 Claustrophobia: Secondary | ICD-10-CM | POA: Diagnosis not present

## 2017-11-20 DIAGNOSIS — Z6841 Body Mass Index (BMI) 40.0 and over, adult: Secondary | ICD-10-CM

## 2017-11-20 DIAGNOSIS — Z803 Family history of malignant neoplasm of breast: Secondary | ICD-10-CM

## 2017-11-20 DIAGNOSIS — E669 Obesity, unspecified: Secondary | ICD-10-CM | POA: Diagnosis present

## 2017-11-20 DIAGNOSIS — R1013 Epigastric pain: Secondary | ICD-10-CM | POA: Diagnosis present

## 2017-11-20 DIAGNOSIS — R112 Nausea with vomiting, unspecified: Secondary | ICD-10-CM | POA: Diagnosis present

## 2017-11-20 DIAGNOSIS — R7989 Other specified abnormal findings of blood chemistry: Secondary | ICD-10-CM | POA: Diagnosis present

## 2017-11-20 DIAGNOSIS — K851 Biliary acute pancreatitis without necrosis or infection: Principal | ICD-10-CM | POA: Diagnosis present

## 2017-11-20 DIAGNOSIS — Z9049 Acquired absence of other specified parts of digestive tract: Secondary | ICD-10-CM

## 2017-11-20 DIAGNOSIS — R197 Diarrhea, unspecified: Secondary | ICD-10-CM

## 2017-11-20 DIAGNOSIS — Z7951 Long term (current) use of inhaled steroids: Secondary | ICD-10-CM | POA: Diagnosis not present

## 2017-11-20 DIAGNOSIS — I10 Essential (primary) hypertension: Secondary | ICD-10-CM | POA: Diagnosis present

## 2017-11-20 DIAGNOSIS — R739 Hyperglycemia, unspecified: Secondary | ICD-10-CM | POA: Diagnosis present

## 2017-11-20 DIAGNOSIS — R740 Nonspecific elevation of levels of transaminase and lactic acid dehydrogenase [LDH]: Secondary | ICD-10-CM

## 2017-11-20 DIAGNOSIS — R945 Abnormal results of liver function studies: Secondary | ICD-10-CM | POA: Diagnosis present

## 2017-11-20 DIAGNOSIS — N179 Acute kidney failure, unspecified: Secondary | ICD-10-CM | POA: Diagnosis not present

## 2017-11-20 DIAGNOSIS — Z888 Allergy status to other drugs, medicaments and biological substances status: Secondary | ICD-10-CM

## 2017-11-20 DIAGNOSIS — K859 Acute pancreatitis without necrosis or infection, unspecified: Secondary | ICD-10-CM | POA: Diagnosis present

## 2017-11-20 DIAGNOSIS — R7401 Elevation of levels of liver transaminase levels: Secondary | ICD-10-CM

## 2017-11-20 DIAGNOSIS — Z8042 Family history of malignant neoplasm of prostate: Secondary | ICD-10-CM | POA: Diagnosis not present

## 2017-11-20 DIAGNOSIS — K831 Obstruction of bile duct: Secondary | ICD-10-CM | POA: Diagnosis present

## 2017-11-20 LAB — COMPREHENSIVE METABOLIC PANEL
ALBUMIN: 3.5 g/dL (ref 3.5–5.0)
ALK PHOS: 144 U/L — AB (ref 38–126)
ALT: 142 U/L — ABNORMAL HIGH (ref 14–54)
ANION GAP: 11 (ref 5–15)
AST: 130 U/L — ABNORMAL HIGH (ref 15–41)
BUN: 13 mg/dL (ref 6–20)
CALCIUM: 9.2 mg/dL (ref 8.9–10.3)
CO2: 22 mmol/L (ref 22–32)
Chloride: 104 mmol/L (ref 101–111)
Creatinine, Ser: 1.3 mg/dL — ABNORMAL HIGH (ref 0.44–1.00)
GFR calc Af Amer: 56 mL/min — ABNORMAL LOW (ref >60)
GFR calc non Af Amer: 48 mL/min — ABNORMAL LOW (ref >60)
GLUCOSE: 190 mg/dL — AB (ref 65–99)
Potassium: 3.8 mmol/L (ref 3.5–5.1)
SODIUM: 137 mmol/L (ref 135–145)
Total Bilirubin: 1.5 mg/dL — ABNORMAL HIGH (ref 0.3–1.2)
Total Protein: 8.1 g/dL (ref 6.5–8.1)

## 2017-11-20 LAB — URINALYSIS, ROUTINE W REFLEX MICROSCOPIC
Bilirubin Urine: NEGATIVE
GLUCOSE, UA: NEGATIVE mg/dL
Hgb urine dipstick: NEGATIVE
KETONES UR: NEGATIVE mg/dL
LEUKOCYTES UA: NEGATIVE
NITRITE: NEGATIVE
PROTEIN: 30 mg/dL — AB
Specific Gravity, Urine: 1.019 (ref 1.005–1.030)
pH: 5 (ref 5.0–8.0)

## 2017-11-20 LAB — I-STAT BETA HCG BLOOD, ED (MC, WL, AP ONLY): I-stat hCG, quantitative: <5 m[IU]/mL (ref <5)

## 2017-11-20 LAB — CBC
HEMATOCRIT: 36.6 % (ref 36.0–46.0)
HEMOGLOBIN: 12.4 g/dL (ref 12.0–15.0)
MCH: 28.6 pg (ref 26.0–34.0)
MCHC: 33.9 g/dL (ref 30.0–36.0)
MCV: 84.3 fL (ref 78.0–100.0)
Platelets: 413 10*3/uL — ABNORMAL HIGH (ref 150–400)
RBC: 4.34 MIL/uL (ref 3.87–5.11)
RDW: 14 % (ref 11.5–15.5)
WBC: 12.9 10*3/uL — ABNORMAL HIGH (ref 4.0–10.5)

## 2017-11-20 LAB — LIPASE, BLOOD: Lipase: 2196 U/L — ABNORMAL HIGH (ref 11–51)

## 2017-11-20 LAB — PROTIME-INR
INR: 1.13
PROTHROMBIN TIME: 14.4 s (ref 11.4–15.2)

## 2017-11-20 LAB — ACETAMINOPHEN LEVEL: ACETAMINOPHEN (TYLENOL), SERUM: 23 ug/mL (ref 10–30)

## 2017-11-20 MED ORDER — SODIUM CHLORIDE 0.9 % IV BOLUS (SEPSIS)
1000.0000 mL | Freq: Once | INTRAVENOUS | Status: AC
  Administered 2017-11-20: 1000 mL via INTRAVENOUS

## 2017-11-20 MED ORDER — ONDANSETRON HCL 4 MG/2ML IJ SOLN
4.0000 mg | Freq: Once | INTRAMUSCULAR | Status: AC
  Administered 2017-11-20: 4 mg via INTRAVENOUS
  Filled 2017-11-20: qty 2

## 2017-11-20 MED ORDER — LACTATED RINGERS IV SOLN
INTRAVENOUS | Status: DC
  Administered 2017-11-20: 23:00:00 via INTRAVENOUS

## 2017-11-20 MED ORDER — LACTATED RINGERS IV BOLUS (SEPSIS)
1000.0000 mL | Freq: Once | INTRAVENOUS | Status: AC
  Administered 2017-11-20: 1000 mL via INTRAVENOUS

## 2017-11-20 MED ORDER — HYDROMORPHONE HCL 1 MG/ML IJ SOLN
1.0000 mg | Freq: Once | INTRAMUSCULAR | Status: AC
  Administered 2017-11-20: 1 mg via INTRAVENOUS
  Filled 2017-11-20: qty 1

## 2017-11-20 NOTE — ED Notes (Signed)
ED TO INPATIENT HANDOFF REPORT  Name/Age/Gender Becky Vazquez 47 y.o. female  Code Status Code Status History    Date Active Date Inactive Code Status Order ID Comments User Context   06/14/2012 02:46 06/17/2012 17:17 Full Code [id number]  Ricky Ala, RN Inpatient      Home/SNF/Other Home  Chief Complaint abdominal pain  Level of Care/Admitting Diagnosis ED Disposition    ED Disposition Condition [place] [facility] Area: [facility] [[id number]]  Level of Care: Med-Surg [16]  Diagnosis: Acute pancreatitis [577.0.ICD-9-CM]  Admitting Physician: Elwyn Reach [[id number]]  Attending Physician: Elwyn Reach [[id number]]  Estimated length of stay: 3 - 4 days  Certification:: I certify this patient will need inpatient services for at least 2 midnights  PT Class (Do Not Modify): Inpatient [101]  PT Acc Code (Do Not Modify): Private [1]       Medical History Past Medical History:  Diagnosis Date  . Hypertension     Allergies Allergies  Allergen Reactions  . Promethazine-Dm     Loss of sensation.     IV Location/Drains/Wounds Patient Lines/Drains/Airways Status   Active Line/Drains/Airways    Name:   Placement date:   Placement time:   Site:   Days:   Peripheral IV 11/20/17 Left Antecubital   11/20/17    2057    Antecubital   less than 1          Labs/Imaging Results for orders placed or performed during the hospital encounter of 11/20/17 (from the past 48 hour(s))  Lipase, blood     Status: Abnormal   Collection Time: 11/20/17  6:49 PM  Result Value Ref Range   Lipase 2,196 (H) 11 - 51 U/L    Comment: RESULTS CONFIRMED BY MANUAL DILUTION Performed at [facility], [place] [address] [place], [place] [address]   Comprehensive metabolic panel     Status: Abnormal   Collection Time: 11/20/17  6:49 PM  Result Value Ref Range   Sodium 137 135 - 145 mmol/L   Potassium 3.8 3.5 - 5.1 mmol/L   Chloride 104 101 -  111 mmol/L   CO2 22 22 - 32 mmol/L   Glucose, Bld 190 (H) 65 - 99 mg/dL   BUN 13 6 - 20 mg/dL   Creatinine, Ser 1.30 (H) 0.44 - 1.00 mg/dL   Calcium 9.2 8.9 - 10.3 mg/dL   Total Protein 8.1 6.5 - 8.1 g/dL   Albumin 3.5 3.5 - 5.0 g/dL   AST 130 (H) 15 - 41 U/L   ALT 142 (H) 14 - 54 U/L   Alkaline Phosphatase 144 (H) 38 - 126 U/L   Total Bilirubin 1.5 (H) 0.3 - 1.2 mg/dL   GFR calc non Af Amer 48 (L) >60 mL/min   GFR calc Af Amer 56 (L) >60 mL/min    Comment: (NOTE) The eGFR has been calculated using the CKD EPI equation. This calculation has not been validated in all clinical situations. eGFR's persistently <60 mL/min signify possible Chronic Kidney Disease.    Anion gap 11 5 - 15    Comment: Performed at [facility], [place] [address], [place], [place] [address]  CBC     Status: Abnormal   Collection Time: 11/20/17  6:49 PM  Result Value Ref Range   WBC 12.9 (H) 4.0 - 10.5 K/uL   RBC 4.34 3.87 - 5.11 MIL/uL   Hemoglobin 12.4 12.0 - 15.0 g/dL   HCT 36.6 36.0 -  46.0 %   MCV 84.3 78.0 - 100.0 fL   MCH 28.6 26.0 - 34.0 pg   MCHC 33.9 30.0 - 36.0 g/dL   RDW 14.0 11.5 - 15.5 %   Platelets 413 (H) 150 - 400 K/uL    Comment: Performed at [facility], [place] [address] [place], [place] [address]  I-Stat beta hCG blood, ED     Status: None   Collection Time: 11/20/17  7:02 PM  Result Value Ref Range   I-stat hCG, quantitative <5.0 <5 mIU/mL   Comment 3            Comment:   GEST. AGE      CONC.  (mIU/mL)   <=1 WEEK        5 - 50     2 WEEKS       50 - 500     3 WEEKS       100 - 10,000     4 WEEKS     1,000 - 30,000        FEMALE AND NON-PREGNANT FEMALE:     LESS THAN 5 mIU/mL   Acetaminophen level     Status: None   Collection Time: 11/20/17  9:18 PM  Result Value Ref Range   Acetaminophen (Tylenol), Serum 23 10 - 30 ug/mL    Comment:        THERAPEUTIC CONCENTRATIONS VARY SIGNIFICANTLY. A RANGE OF 10-30 ug/mL MAY BE AN  EFFECTIVE CONCENTRATION FOR MANY PATIENTS. HOWEVER, SOME ARE BEST TREATED AT CONCENTRATIONS OUTSIDE THIS RANGE. ACETAMINOPHEN CONCENTRATIONS >150 ug/mL AT 4 HOURS AFTER INGESTION AND >50 ug/mL AT 12 HOURS AFTER INGESTION ARE OFTEN ASSOCIATED WITH TOXIC REACTIONS. Performed at [facility], [place] [address] [place], [place] [address]   Protime-INR     Status: None   Collection Time: 11/20/17  9:18 PM  Result Value Ref Range   Prothrombin Time 14.4 11.4 - 15.2 seconds   INR 1.13     Comment: Performed at [facility], [place] [address] [place], [place] [address]   No results found.  Pending Labs Unresulted Labs (From admission, onward)   Start     Ordered   11/20/17 2112  Urine culture  STAT,   STAT     11/20/17 2111   11/20/17 1848  Urinalysis, Routine w reflex microscopic  STAT,   STAT     11/20/17 1848   Signed and Held  HIV antibody (Routine Testing)  Once,   R     Signed and Held   Signed and Held  CBC  (enoxaparin (LOVENOX)    CrCl >/= 30 ml/min)  Once,   R    Comments:  Baseline for enoxaparin therapy IF NOT ALREADY DRAWN.  Notify MD if PLT < 100 K.    Signed and Held   Signed and Held  Creatinine, serum  (enoxaparin (LOVENOX)    CrCl >/= 30 ml/min)  Once,   R    Comments:  Baseline for enoxaparin therapy IF NOT ALREADY DRAWN.    Signed and Held   Signed and Held  Creatinine, serum  (enoxaparin (LOVENOX)    CrCl >/= 30 ml/min)  Weekly,   R    Comments:  while on enoxaparin therapy    Signed and Held   Signed and Held  Comprehensive metabolic panel  Tomorrow morning,   R     Signed and Held   Signed and Held  CBC  Tomorrow  morning,   R     Signed and Held   Signed and Held  TSH  Once,   R     Signed and Held      Vitals/Pain Today's Vitals   11/20/17 2200 11/20/17 2230 11/20/17 2300 11/20/17 2331  BP: 115/63 (!) 142/90 138/80   Pulse: (!) 55 (!) 54 (!) 58   Resp:      Temp:      TempSrc:      SpO2: 97% 97% 96%    PainSc:    6     Isolation Precautions No active isolations  Medications Medications  lactated ringers infusion ( Intravenous New Bag/Given 11/20/17 2233)  sodium chloride 0.9 % bolus 1,000 mL (0 mLs Intravenous Stopped 11/20/17 2130)  HYDROmorphone (DILAUDID) injection 1 mg (1 mg Intravenous Given 11/20/17 2117)  ondansetron (ZOFRAN) injection 4 mg (4 mg Intravenous Given 11/20/17 2115)  lactated ringers bolus 1,000 mL (0 mLs Intravenous Stopped 11/20/17 2226)  lactated ringers bolus 1,000 mL (0 mLs Intravenous Stopped 11/20/17 2331)    Mobility walks

## 2017-11-20 NOTE — H&P (Signed)
History and Physical    Becky Vazquez OMV:[id number] DOB: Jun 16, 1971 DOA: 11/20/2017  Referring MD/NP/PA: Dr Sherry Ruffing  PCP: Antonietta Jewel, MD   Outpatient Specialists: Dr [facility]   Patient coming from: Home  Chief Complaint: Abdominal pain  HPI: Becky Vazquez is a 47 y.o. female with medical history significant of history of cholecystectomy who is followed by GI presenting with recurrent abdominal pain nausea vomiting. Patient was seen in the year 2 days ago was evaluated and discharged home. At that time no significant finding and has CT. She came back today due to persistent nausea vomiting. Pain was rated as 9 out of 10 aggravated by activities and meals radiating to her back. Pain was burning in sensation but now sharp. She has slight improvement yesterday after coming in 2 days ago however symptoms have persisted. This is her third visit therefore seeking help. Today she was found to have elevated lipase as well as LFTs. Patient appears to have obstructive cause of pancreatitis. No hematochezia nor melena. Patient denied any hematemesis. No prior GI bleed. She is not alcoholic.   ED Course: Patient was seen and evaluated. Her lipase today is 2196 AST 1:30 L2 and 42 total bilirubin 1.5 with alkaline phosphatase 144. Glucose 190 with BUN/creatinine of 13 and 1.3. White count is 13,000 and platelets 413. GI was consulted and hospitalist consult for admission. Patient will be seen by GI in the morning  Review of Systems: As per HPI otherwise 10 point review of systems negative.    Past Medical History:  Diagnosis Date  . Hypertension     Past Surgical History:  Procedure Laterality Date  . CHOLECYSTECTOMY    . TUBAL LIGATION     Social history: Patient has been pretty active at home moving around without any problems  reports that  has never smoked. she has never used smokeless tobacco. She reports that she does not drink alcohol or use drugs.  Allergies  Allergen  Reactions  . Promethazine-Dm     Loss of sensation.     Family History  Problem Relation Age of Onset  . Stomach cancer Mother   . Cancer Mother   . Prostate cancer Father   . Cancer Father   . Breast cancer Maternal Aunt   . Breast cancer Paternal Aunt      Prior to Admission medications   Medication Sig Start Date End Date Taking? Authorizing Provider  atenolol (TENORMIN) 25 MG tablet Take 25 mg by mouth daily.   Yes [provider]  BIOTIN PO Take 1 tablet by mouth daily.   Yes [provider]  cyanocobalamin 2000 MCG tablet Take 2,000 mcg by mouth daily.   Yes [provider]  fluticasone (FLONASE) 50 MCG/ACT nasal spray Place 2 sprays into both nostrils daily.    Yes [provider]  hydrochlorothiazide (HYDRODIURIL) 25 MG tablet Take 1 tablet by mouth daily. 10/21/14  Yes [provider]  JINTELI 1-5 MG-MCG TABS tablet Take 1 tablet by mouth daily. 11/05/17  Yes [provider]  liothyronine (CYTOMEL) 25 MCG tablet Take 25 mcg by mouth daily. 11/01/17  Yes [provider]  lisinopril (PRINIVIL,ZESTRIL) 20 MG tablet Take 1 tablet by mouth daily. 10/21/14  Yes [provider]  Multiple Vitamins-Minerals (WOMENS DAILY FORMULA PO) Take 1 tablet by mouth daily.   Yes [provider]  omeprazole (PRILOSEC) 20 MG capsule Take 1 capsule (20 mg total) by mouth daily. 11/18/17  Yes Ripley Fraise, MD  phentermine 37.5 MG capsule Take 37.5 mg by mouth every morning.   Yes [provider]  sulfamethoxazole-trimethoprim (BACTRIM DS,SEPTRA DS) 800-160 MG tablet Take 1 tablet by mouth 2 (two) times daily. 11/18/17  Yes [provider]  traMADol (ULTRAM) 50 MG tablet Take 50 mg by mouth daily as needed for pain. 11/18/17  Yes [provider]    Physical Exam: Vitals:   11/20/17 1826 11/20/17 2057  BP: 140/86 (!) 134/92  Pulse: 68 70  Resp: 18 18  Temp: 99.2 F (37.3 C)   TempSrc: Oral     SpO2: 100% 100%      Constitutional: NAD, calm, comfortable Vitals:   11/20/17 1826 11/20/17 2057  BP: 140/86 (!) 134/92  Pulse: 68 70  Resp: 18 18  Temp: 99.2 F (37.3 C)   TempSrc: Oral   SpO2: 100% 100%   Eyes: PERRL, lids and conjunctivae normal ENMT: Mucous membranes are moist. Posterior pharynx clear of any exudate or lesions.Normal dentition.  Neck: normal, supple, no masses, no thyromegaly Respiratory: clear to auscultation bilaterally, no wheezing, no crackles. Normal respiratory effort. No accessory muscle use.  Cardiovascular: Regular rate and rhythm, no murmurs / rubs / gallops. No extremity edema. 2+ pedal pulses. No carotid bruits.  Abdomen: Diffuse epigastric and mid abdomen tenderness, no masses palpated. No hepatosplenomegaly. Bowel sounds positive.  Musculoskeletal: no clubbing / cyanosis. No joint deformity upper and lower extremities. Good ROM, no contractures. Normal muscle tone.  Skin: no rashes, lesions, ulcers. No induration Neurologic: CN 2-12 grossly intact. Sensation intact, DTR normal. Strength 5/5 in all 4.  Psychiatric: Normal judgment and insight. Alert and oriented x 3. Normal mood.   Labs on Admission: I have personally reviewed following labs and imaging studies  CBC: Recent Labs  Lab 11/16/17 1241 11/18/17 0117 11/20/17 1849  WBC 7.0 8.0 12.9*  NEUTROABS  --  4.7  --   HGB 12.1 11.0* 12.4  HCT 35.5* 33.0* 36.6  MCV 84.9 85.7 84.3  PLT 362 364 786*   Basic Metabolic Panel: Recent Labs  Lab 11/16/17 1241 11/18/17 0117 11/20/17 1849  NA 140 136 137  K 3.5 3.6 3.8  CL 100* 103 104  CO2 29 24 22   GLUCOSE 123* 133* 190*  BUN 14 8 13   CREATININE 0.92 0.80 1.30*  CALCIUM 9.5 8.9 9.2   GFR: Estimated Creatinine Clearance: 63.9 mL/min (A) (by C-G formula based on SCr of 1.3 mg/dL (H)). Liver Function Tests: Recent Labs  Lab 11/16/17 1241 11/18/17 0117 11/20/17 1849  AST 17 15 130*  ALT 14 11* 142*  ALKPHOS 48 45 144*   BILITOT 0.7 0.2* 1.5*  PROT 8.0 6.7 8.1  ALBUMIN 3.5 3.2* 3.5   Recent Labs  Lab 11/16/17 1241 11/18/17 0117 11/20/17 1849  LIPASE 24 26 2,196*   No results for input(s): AMMONIA in the last 168 hours. Coagulation Profile: Recent Labs  Lab 11/20/17 2118  INR 1.13   Cardiac Enzymes: Recent Labs  Lab 11/18/17 0330  TROPONINI <0.03   BNP (last 3 results) No results for input(s): PROBNP in the last 8760 hours. HbA1C: No results for input(s): HGBA1C in the last 72 hours. CBG: No results for input(s): GLUCAP in the last 168 hours. Lipid Profile: No results for input(s): CHOL, HDL, LDLCALC, TRIG, CHOLHDL, LDLDIRECT in the last 72 hours. Thyroid Function Tests: No results for input(s): TSH, T4TOTAL, FREET4, T3FREE, THYROIDAB in the last 72 hours. Anemia Panel: No results for input(s): VITAMINB12, FOLATE, FERRITIN, TIBC, IRON,  RETICCTPCT in the last 72 hours. Urine analysis:    Component Value Date/Time   COLORURINE YELLOW 11/18/2017 [place] 11/18/2017 0249   LABSPEC 1.023 11/18/2017 0249   PHURINE 7.0 11/18/2017 0249   GLUCOSEU NEGATIVE 11/18/2017 0249   HGBUR NEGATIVE 11/18/2017 0249   HGBUR negative 10/16/2009 0944   BILIRUBINUR NEGATIVE 11/18/2017 0249   BILIRUBINUR small 11/08/2014 1136   KETONESUR NEGATIVE 11/18/2017 0249   PROTEINUR NEGATIVE 11/18/2017 0249   UROBILINOGEN 0.2 04/15/2016 1142   NITRITE NEGATIVE 11/18/2017 0249   LEUKOCYTESUR NEGATIVE 11/18/2017 0249   Sepsis Labs: @LABRCNTIP (procalcitonin:4,lacticidven:4) )No results found for this or any previous visit (from the past 240 hour(s)).   Radiological Exams on Admission: No results found.    Assessment/Plan Principal Problem:   Pancreatitis due to biliary obstruction Active Problems:   OBESITY, NOS   HYPERTENSION, BENIGN   Nausea & vomiting   LFTs abnormal   Hyperglycemia   ARF (acute renal failure) (HCC)   Acute pancreatitis   #1 acute pancreatitis: Most likely  due to obstruction in the common bile duct region. GI consulted and patient will have MRCP. Complete bowel rest with pain management nausea vomiting management. Place patient on lactated Ringer's solution. Monitor closely.  #2 increased LFTs: Probably secondary to obstructive stone in the common bile duct. Monitor LFTs. Consider ultrasound.  #3 hypertension: Continue blood pressure control.  #4 hyperglycemia: No history of diabetes. Monitor blood glucose closely. We may have to do sliding scale insulin if it continues to increase.  #5 acute kidney injury: Hydrate patient and monitor closely.  #6 morbid obesity: Dietary counseling.     DVT prophylaxis: Lovenox   Code Status: Full code  Family Communication: None  Disposition Plan: Home  Consults called: GI, Dr Carlean Purl  Admission status: Inpatient   Severity of Illness: The appropriate patient status for this patient is INPATIENT. Inpatient status is judged to be reasonable and necessary in order to provide the required intensity of service to ensure the patient's safety. The patient's presenting symptoms, physical exam findings, and initial radiographic and laboratory data in the context of their chronic comorbidities is felt to place them at high risk for further clinical deterioration. Furthermore, it is not anticipated that the patient will be medically stable for discharge from the hospital within 2 midnights of admission. The following factors support the patient status of inpatient.   " The patient's presenting symptoms include abdominal pain nausea vomiting. " The worrisome physical exam findings include abdominal tenderness. " The initial radiographic and laboratory data are worrisome because of elevated lipase of 1200. " The chronic co-morbidities include morbid obesity.   * I certify that at the point of admission it is my clinical judgment that the patient will require inpatient hospital care spanning beyond 2 midnights  from the point of admission due to high intensity of service, high risk for further deterioration and high frequency of surveillance required.Barbette Merino MD [facility] Pager [phone]  If 7PM-7AM, please contact night-coverage [email or web address] Password [facility]  11/20/2017, 11:03 PM

## 2017-11-20 NOTE — ED Provider Notes (Signed)
[place] DEPT Provider Note   CSN: [id number] Arrival date & time: 11/20/17  1816     History   Chief Complaint Chief Complaint  Patient presents with  . Abdominal Pain    HPI Becky Vazquez is a 47 y.o. female.  The history is provided by the patient, a friend and medical records.  Abdominal Pain   This is a new problem. The current episode started more than 2 days ago. The problem occurs constantly. The problem has been gradually worsening. The pain is associated with an unknown factor. The pain is located in the epigastric region. The quality of the pain is sharp and pressure-like. The pain is at a severity of 10/10. The pain is severe. Associated symptoms include nausea and vomiting. Pertinent negatives include fever, diarrhea, constipation, dysuria, frequency and headaches. The symptoms are aggravated by palpation and eating. Nothing relieves the symptoms. Past workup includes CT scan.    Past Medical History:  Diagnosis Date  . Hypertension     Patient Active Problem List   Diagnosis Date Noted  . Allergic rhinitis 12/02/2014  . Vaginal discharge 05/11/2013  . Dysuria 05/11/2013  . Abdominal pain, acute 06/14/2012  . Enteritis 06/14/2012  . Dehydration 06/14/2012  . Nausea & vomiting 06/14/2012  . Constipation 06/14/2012  . FATIGUE 03/03/2010  . VAGINITIS 10/16/2009  . IMPAIRED GLUCOSE TOLERANCE 01/09/2008  . ANEMIA, IRON DEFICIENCY 01/09/2008  . LEIOMYOMA, UTERUS 02/07/2007  . HYPERTENSION, BENIGN 12/28/2006  . OBESITY, NOS 11/24/2006    Past Surgical History:  Procedure Laterality Date  . CHOLECYSTECTOMY    . TUBAL LIGATION      OB History    No data available       Home Medications    Prior to Admission medications   Medication Sig Start Date End Date Taking? Authorizing Provider  atenolol (TENORMIN) 25 MG tablet Take 25 mg by mouth daily.    [provider]  BIOTIN PO Take 1 tablet by mouth daily.     [provider]  cyanocobalamin 2000 MCG tablet Take 2,000 mcg by mouth daily.    [provider]  fluticasone (FLONASE) 50 MCG/ACT nasal spray Place 2 sprays into both nostrils daily.     [provider]  hydrochlorothiazide (HYDRODIURIL) 25 MG tablet Take 1 tablet by mouth daily. 10/21/14   [provider]  JINTELI 1-5 MG-MCG TABS tablet Take 1 tablet by mouth daily. 11/05/17   [provider]  liothyronine (CYTOMEL) 25 MCG tablet Take 25 mcg by mouth daily. 11/01/17   [provider]  lisinopril (PRINIVIL,ZESTRIL) 20 MG tablet Take 1 tablet by mouth daily. 10/21/14   [provider]  Multiple Vitamins-Minerals (WOMENS DAILY FORMULA PO) Take 1 tablet by mouth daily.    [provider]  omeprazole (PRILOSEC) 20 MG capsule Take 1 capsule (20 mg total) by mouth daily. 11/18/17   Ripley Fraise, MD  phentermine 37.5 MG capsule Take 37.5 mg by mouth every morning.    [provider]    Family History Family History  Problem Relation Age of Onset  . Stomach cancer Mother   . Cancer Mother   . Prostate cancer Father   . Cancer Father   . Breast cancer Maternal Aunt   . Breast cancer Paternal Aunt     Social History Social History   Tobacco Use  . Smoking status: Never Smoker  . Smokeless tobacco: Never Used  Substance Use Topics  . Alcohol use: No  .  Drug use: No     Allergies   Promethazine-dm   Review of Systems Review of Systems  Constitutional: Positive for fatigue. Negative for chills, diaphoresis and fever.  HENT: Negative for congestion.   Eyes: Negative for visual disturbance.  Respiratory: Negative for cough, chest tightness, shortness of breath and stridor.   Cardiovascular: Negative for chest pain and palpitations.  Gastrointestinal: Positive for abdominal pain, nausea and vomiting. Negative for constipation and diarrhea.  Genitourinary: Negative for dysuria and frequency.    Musculoskeletal: Negative for back pain, neck pain and neck stiffness.  Skin: Negative for rash and wound.  Neurological: Negative for dizziness and headaches.  Psychiatric/Behavioral: Negative for agitation.  All other systems reviewed and are negative.    Physical Exam Updated Vital Signs BP 140/86   Pulse 68   Temp 99.2 F (37.3 C) (Oral)   Resp 18   LMP 02/28/2014   SpO2 100% Comment: Simultaneous filing. User may not have seen previous data.  Physical Exam  Constitutional: She is oriented to person, place, and time. She appears well-developed and well-nourished. No distress.  HENT:  Head: Normocephalic and atraumatic.  Right Ear: External ear normal.  Left Ear: External ear normal.  Nose: Nose normal.  Mouth/Throat: Oropharynx is clear and moist. No oropharyngeal exudate.  Eyes: Conjunctivae and EOM are normal. Pupils are equal, round, and reactive to light.  Neck: Normal range of motion. Neck supple.  Cardiovascular: Normal rate and intact distal pulses.  No murmur heard. Pulmonary/Chest: Effort normal. No stridor. No respiratory distress. She has no wheezes. She has no rales. She exhibits no tenderness.  Abdominal: Soft. She exhibits no distension and no mass. There is tenderness. There is no rebound.  Musculoskeletal: She exhibits no edema, tenderness or deformity.  Neurological: She is alert and oriented to person, place, and time. She has normal reflexes. No sensory deficit. She exhibits normal muscle tone.  Skin: Skin is warm. Capillary refill takes less than 2 seconds. No rash noted. She is not diaphoretic. No erythema.  Nursing note and vitals reviewed.    ED Treatments / Results  Labs (all labs ordered are listed, but only abnormal results are displayed) Labs Reviewed  LIPASE, BLOOD - Abnormal; Notable for the following components:      Result Value   Lipase 2,196 (*)    All other components within normal limits  COMPREHENSIVE METABOLIC PANEL - Abnormal;  Notable for the following components:   Glucose, Bld 190 (*)    Creatinine, Ser 1.30 (*)    AST 130 (*)    ALT 142 (*)    Alkaline Phosphatase 144 (*)    Total Bilirubin 1.5 (*)    GFR calc non Af Amer 48 (*)    GFR calc Af Amer 56 (*)    All other components within normal limits  CBC - Abnormal; Notable for the following components:   WBC 12.9 (*)    Platelets 413 (*)    All other components within normal limits  URINALYSIS, ROUTINE W REFLEX MICROSCOPIC - Abnormal; Notable for the following components:   Color, Urine AMBER (*)    Protein, ur 30 (*)    Bacteria, UA RARE (*)    Squamous Epithelial / LPF 0-5 (*)    All other components within normal limits  URINE CULTURE  ACETAMINOPHEN LEVEL  PROTIME-INR  HIV ANTIBODY (ROUTINE TESTING)  COMPREHENSIVE METABOLIC PANEL  CBC  TSH  I-STAT BETA HCG BLOOD, ED ([facility], [facility], [facility] ONLY)    EKG  EKG Interpretation None       Radiology No results found.  Procedures Procedures (including critical care time)  Medications Ordered in ED Medications  lactated ringers bolus 1,000 mL (1,000 mLs Intravenous New Bag/Given 11/20/17 2226)  lactated ringers infusion (not administered)  sodium chloride 0.9 % bolus 1,000 mL (0 mLs Intravenous Stopped 11/20/17 2130)  HYDROmorphone (DILAUDID) injection 1 mg (1 mg Intravenous Given 11/20/17 2117)  ondansetron (ZOFRAN) injection 4 mg (4 mg Intravenous Given 11/20/17 2115)  lactated ringers bolus 1,000 mL (0 mLs Intravenous Stopped 11/20/17 2226)     Initial Impression / Assessment and Plan / ED Course  I have reviewed the triage vital signs and the nursing notes.  Pertinent labs & imaging results that were available during my care of the patient were reviewed by me and considered in my medical decision making (see chart for details).     Becky Vazquez is a 47 y.o. female with a past medical history significant for hypertension, iron deficiency anemia, prior cholecystectomy and obesity who presents  with continued abdominal pain nausea and vomiting.  Patient reports that for the last 4 days she has had severe upper abdominal pain.  She has been seen twice at different facilities with reassuring workups including lab testing and imaging.  Patient says that her pain is continued today to the point where she could not tolerate it.  She describes it as a 10 out of 10 in severity and across her upper abdomen.  She has not had any oral intake over the last 24 hours.  She reports some diarrhea but denies any constipation.  She has had no success with over-the-counter medications including Tylenol.  On exam, patient has tenderness across her upper abdomen.  Patient's lungs are clear and back is nontender.  No CVA tenderness.  Patient had reassuring vital signs on arrival.  Patient had laboratory testing that revealed a significant increase in her lipase to 2196 and elevation in AST, ALT, and alk phos.  Patient also has increase in her bilirubin.  Patient had CT scan several days ago which was reassuring with no evidence of abscess, dilated biliary ducts, or biliary stone.  No evidence of abscess or obstruction.  With these new findings on lab testing, gastroenterology was called.  Patient is a previous [facility]  [facility] was called and they feel the patient needs further workup including admission to the hospital service, MRCP overnight, and they will see the patient in the morning.  They recommended lactated Ringer's for rehydration for her as well as symptomatic medications.  Hospitalist team will be called for admission for further workup and management of her transaminitis and pancreatitis with poor p.o. tolerance.   Final Clinical Impressions(s) / ED Diagnoses   Final diagnoses:  Epigastric pain  Nausea vomiting and diarrhea  Transaminitis  Acute pancreatitis, unspecified complication status, unspecified pancreatitis type    ED Discharge Orders    None        Clinical Impression: 1. Epigastric pain   2. Nausea vomiting and diarrhea   3. Transaminitis   4. Acute pancreatitis, unspecified complication status, unspecified pancreatitis type     Disposition: Admit  This note was prepared with assistance of Dragon voice recognition software. Occasional wrong-word or sound-a-like substitutions may have occurred due to the inherent limitations of voice recognition software.     Ladrea Holladay, Gwenyth Allegra, MD 11/21/17 [phone]

## 2017-11-20 NOTE — ED Triage Notes (Signed)
Pt bib EMS and coming from home.  Pt presents with abdominal pain, n/v x 4 days. Pt had midline upper abd pain that pt describes as "burning"  Pt also reports left flank pain that radiates medially.  Pt had a slight improvement of her symptoms yesterday but has become worse today.  Pt reported diarrhea today from 9am to 1pm.  Pt was seen at [facility] on Wednesday and [facility] on Thursday for the same symptoms.  Pt vomited stomach bile on arrival to ED.

## 2017-11-21 ENCOUNTER — Other Ambulatory Visit: Payer: Self-pay

## 2017-11-21 ENCOUNTER — Inpatient Hospital Stay (HOSPITAL_COMMUNITY): Payer: Managed Care, Other (non HMO)

## 2017-11-21 DIAGNOSIS — N179 Acute kidney failure, unspecified: Secondary | ICD-10-CM

## 2017-11-21 DIAGNOSIS — K859 Acute pancreatitis without necrosis or infection, unspecified: Secondary | ICD-10-CM

## 2017-11-21 DIAGNOSIS — K851 Biliary acute pancreatitis without necrosis or infection: Principal | ICD-10-CM

## 2017-11-21 LAB — CBC
HEMATOCRIT: 33.7 % — AB (ref 36.0–46.0)
HEMOGLOBIN: 11.4 g/dL — AB (ref 12.0–15.0)
MCH: 28.4 pg (ref 26.0–34.0)
MCHC: 33.8 g/dL (ref 30.0–36.0)
MCV: 84 fL (ref 78.0–100.0)
Platelets: 347 10*3/uL (ref 150–400)
RBC: 4.01 MIL/uL (ref 3.87–5.11)
RDW: 14.2 % (ref 11.5–15.5)
WBC: 10.6 10*3/uL — ABNORMAL HIGH (ref 4.0–10.5)

## 2017-11-21 LAB — COMPREHENSIVE METABOLIC PANEL
ALBUMIN: 3.1 g/dL — AB (ref 3.5–5.0)
ALK PHOS: 118 U/L (ref 38–126)
ALT: 109 U/L — ABNORMAL HIGH (ref 14–54)
AST: 76 U/L — ABNORMAL HIGH (ref 15–41)
Anion gap: 14 (ref 5–15)
BILIRUBIN TOTAL: 0.9 mg/dL (ref 0.3–1.2)
BUN: 12 mg/dL (ref 6–20)
CALCIUM: 8.9 mg/dL (ref 8.9–10.3)
CO2: 21 mmol/L — ABNORMAL LOW (ref 22–32)
Chloride: 105 mmol/L (ref 101–111)
Creatinine, Ser: 1.09 mg/dL — ABNORMAL HIGH (ref 0.44–1.00)
GFR calc non Af Amer: 60 mL/min — ABNORMAL LOW (ref >60)
GLUCOSE: 105 mg/dL — AB (ref 65–99)
POTASSIUM: 3.6 mmol/L (ref 3.5–5.1)
SODIUM: 140 mmol/L (ref 135–145)
TOTAL PROTEIN: 7 g/dL (ref 6.5–8.1)

## 2017-11-21 LAB — TSH: TSH: 1.984 u[IU]/mL (ref 0.350–4.500)

## 2017-11-21 LAB — HIV ANTIBODY (ROUTINE TESTING W REFLEX): HIV Screen 4th Generation wRfx: NONREACTIVE

## 2017-11-21 MED ORDER — GADOBENATE DIMEGLUMINE 529 MG/ML IV SOLN: 20.0000 mL | Freq: Once | INTRAVENOUS | Status: DC | PRN

## 2017-11-21 MED ORDER — ENOXAPARIN SODIUM 40 MG/0.4ML ~~LOC~~ SOLN
40.0000 mg | SUBCUTANEOUS | Status: DC
  Administered 2017-11-21 – 2017-11-22 (×2): 40 mg via SUBCUTANEOUS
  Filled 2017-11-21 (×2): qty 0.4

## 2017-11-21 MED ORDER — HYDROMORPHONE HCL 1 MG/ML IJ SOLN
1.0000 mg | INTRAMUSCULAR | Status: DC | PRN
  Administered 2017-11-21: 1 mg via INTRAVENOUS
  Filled 2017-11-21: qty 1

## 2017-11-21 MED ORDER — ONDANSETRON HCL 4 MG PO TABS: 4.0000 mg | ORAL_TABLET | Freq: Four times a day (QID) | ORAL | Status: DC | PRN

## 2017-11-21 MED ORDER — LACTATED RINGERS IV SOLN
INTRAVENOUS | Status: DC
  Administered 2017-11-21 – 2017-11-22 (×4): via INTRAVENOUS

## 2017-11-21 MED ORDER — HYDRALAZINE HCL 20 MG/ML IJ SOLN: 10.0000 mg | Freq: Four times a day (QID) | INTRAMUSCULAR | Status: DC | PRN

## 2017-11-21 MED ORDER — ONDANSETRON HCL 4 MG/2ML IJ SOLN
4.0000 mg | Freq: Four times a day (QID) | INTRAMUSCULAR | Status: DC | PRN
Start: 2017-11-21 — End: 2017-11-22

## 2017-11-21 MED ORDER — PANTOPRAZOLE SODIUM 40 MG IV SOLR
40.0000 mg | Freq: Every day | INTRAVENOUS | Status: DC
  Administered 2017-11-21 (×2): 40 mg via INTRAVENOUS
  Filled 2017-11-21 (×2): qty 40

## 2017-11-21 MED ORDER — LIP MEDEX EX OINT
TOPICAL_OINTMENT | CUTANEOUS | Status: AC
  Administered 2017-11-21: 15:00:00
  Filled 2017-11-21: qty 7

## 2017-11-21 NOTE — Consult Note (Signed)
Referring Provider: [facility]    Primary Care Physician:  Antonietta Jewel, MD Primary Gastroenterologist:  Zenovia Jarred, MD  Reason for Consultation:  pancreatitis   ASSESSMENT AND PLAN:     46. 47 yo female with acute pancreatitis, possibly biliary. No dilated ducts on imaging but mildly abnormal liver labs which have improved overnight. She is s/p cholecystectomy 2014 at [facility], cholelithiasis on path. She is scheduled for MRCP If negative then need to consider medication induced pancreatitis as she is on an ACEI and also med containing HCTZ. Additionally, if MRCP negative will need to check triglycerides. She seldom drinks ETOH. Ca+ normal.  -supportive care for pancreatitis. She received 3 Liter bolus and currently has LR at 124m / hr. HCT 33%. Pain is controlled -MRCP already ordered.   2. AKI, improving. Cr 1.3 >>>1.09.    HPI: Becky Wieseleris a 47y.o. female with hx of HTN and morbid obesity who developed intermittent right sided abdominal pain and vomiting last week. The pain initially started in right flank area and awoke her from sleep. Initially seen at Urgent Care where there was concern for appendicitis. Sent for CTscan 11/16/17 which was negative. Pain persisted, became for localized to upper abdomen so patient went to ED. Labs in ED were unrevealing. Discharged home with PPI for possible reflux related pain . Pain and vomiting did temporarily improve but by Sunday both had returned and she had had several episodes of diarrhea as well. She came back to ED on 11/20/17 at which time liver labs and lipase were found to be elevated.  No fevers / chills. Patient is post cholecystectomy (symptomatic cholelithiasis). Currently her pain is controlled, no vomiting. No other GI complaints. Typically her BMs are normal. No rectal bleeding. No unusual weight loss.   [place] drinks ETOH. The only new medication preceding pancreatitis is her thyroid medication. PCP started her  on Bactrim Friday(? Reason) but she only had a couple of doses). She does take an ACEI as well as HCTZ.    Past Medical History:  Diagnosis Date  . Hypertension     Past Surgical History:  Procedure Laterality Date  . CHOLECYSTECTOMY    . TUBAL LIGATION      Prior to Admission medications   Medication Sig Start Date End Date Taking? Authorizing Provider  atenolol (TENORMIN) 25 MG tablet Take 25 mg by mouth daily.   Yes [provider]  BIOTIN PO Take 1 tablet by mouth daily.   Yes [provider]  cyanocobalamin 2000 MCG tablet Take 2,000 mcg by mouth daily.   Yes [provider]  fluticasone (FLONASE) 50 MCG/ACT nasal spray Place 2 sprays into both nostrils daily.    Yes [provider]  hydrochlorothiazide (HYDRODIURIL) 25 MG tablet Take 1 tablet by mouth daily. 10/21/14  Yes [provider]  JINTELI 1-5 MG-MCG TABS tablet Take 1 tablet by mouth daily. 11/05/17  Yes [provider]  liothyronine (CYTOMEL) 25 MCG tablet Take 25 mcg by mouth daily. 11/01/17  Yes [provider]  lisinopril (PRINIVIL,ZESTRIL) 20 MG tablet Take 1 tablet by mouth daily. 10/21/14  Yes [provider]  Multiple Vitamins-Minerals (WOMENS DAILY FORMULA PO) Take 1 tablet by mouth daily.   Yes [provider]  omeprazole (PRILOSEC) 20 MG capsule Take 1 capsule (20 mg total) by mouth daily. 11/18/17  Yes WRipley Fraise MD  phentermine 37.5 MG capsule Take 37.5 mg by mouth every morning.   Yes [provider]  sulfamethoxazole-trimethoprim (BACTRIM DS,SEPTRA DS) 800-160 MG tablet Take 1 tablet by mouth 2 (two) times daily. 11/18/17  Yes [provider]  traMADol (ULTRAM) 50 MG tablet Take 50 mg by mouth daily as needed for pain. 11/18/17  Yes [provider]    Current Facility-Administered Medications  Medication Dose Route Frequency Provider Last Rate Last Dose  . enoxaparin (LOVENOX) injection 40 mg  40 mg  Subcutaneous Q24H Garba, Mohammad L, MD      . hydrALAZINE (APRESOLINE) injection 10 mg  10 mg Intravenous Q6H PRN Bodenheimer, Charles A, NP      . HYDROmorphone (DILAUDID) injection 1 mg  1 mg Intravenous Q3H PRN Elwyn Reach, MD   1 mg at 11/21/17 6195  . lactated ringers infusion   Intravenous Continuous Tegeler, Gwenyth Allegra, MD   Stopped at 11/21/17 0100  . lactated ringers infusion   Intravenous Continuous Elwyn Reach, MD 125 mL/hr at 11/21/17 0530    . ondansetron (ZOFRAN) tablet 4 mg  4 mg Oral Q6H PRN Elwyn Reach, MD       Or  . ondansetron (ZOFRAN) injection 4 mg  4 mg Intravenous Q6H PRN Gala Romney L, MD      . pantoprazole (PROTONIX) injection 40 mg  40 mg Intravenous QHS Gala Romney L, MD   40 mg at 11/21/17 0100    Allergies as of 11/20/2017 - Review Complete 11/20/2017  Allergen Reaction Noted  . Promethazine-dm  12/02/2014    Family History  Problem Relation Age of Onset  . Stomach cancer Mother   . Cancer Mother   . Prostate cancer Father   . Cancer Father   . Breast cancer Maternal Aunt   . Breast cancer Paternal Aunt     Social History   Socioeconomic History  . Marital status: Single    Spouse name: Not on file  . Number of children: 1  . Years of education: Not on file  . Highest education level: Not on file  Social Needs  . Financial resource strain: Not on file  . Food insecurity - worry: Not on file  . Food insecurity - inability: Not on file  . Transportation needs - medical: Not on file  . Transportation needs - non-medical: Not on file  Occupational History  . Occupation: childcare provider    Employer: [employer]  Tobacco Use  . Smoking status: Never Smoker  . Smokeless tobacco: Never Used  Substance and Sexual Activity  . Alcohol use: No  . Drug use: No  . Sexual activity: Yes    Birth control/protection: Pill  Other Topics Concern  . Not on file  Social History Narrative  . Not on file    Review of  Systems: All systems reviewed and negative except where noted in HPI.  Physical Exam: Vital signs in last 24 hours: Temp:  [98.3 F (36.8 C)-99.2 F (37.3 C)] 98.6 F (37 C) (02/25 0600) Pulse Rate:  [54-70] 64 (02/25 0600) Resp:  [18-20] 20 (02/25 0600) BP: (115-142)/(63-92) 131/63 (02/25 0600) SpO2:  [96 %-100 %] 100 % (02/25 0600) Weight:  [233 lb 4 oz (105.8 kg)] 233 lb 4 oz (105.8 kg) (02/25 0100) Last BM Date: 11/20/17 General:   Alert, obese female in NAD Psych:  Pleasant, cooperative. Normal mood and affect. Eyes:  Pupils equal, sclera clear, no icterus.   Conjunctiva pink. Ears:  Normal auditory acuity. Nose:  No deformity, discharge,  or lesions. Neck:  Supple; no masses Lungs:  Clear  throughout to auscultation.   No wheezes, crackles, or rhonchi.  Heart:  Regular rate and rhythm; no murmurs, no edema Abdomen:  Soft, non-distended, nontender, BS active,   Rectal:  Deferred  Msk:  Symmetrical without gross deformities. . Neurologic:  Alert and  oriented x4;  grossly normal neurologically. Skin:  Intact without significant lesions or rashes..   Intake/Output from previous day: 02/24 0701 - 02/25 0700 In: 3053.3 [I.V.:653.3; IV Piggyback:2400] Out: 500 [Urine:500] Intake/Output this shift: No intake/output data recorded.  Lab Results: Recent Labs    11/20/17 1849 11/21/17 0419  WBC 12.9* 10.6*  HGB 12.4 11.4*  HCT 36.6 33.7*  PLT 413* 347   BMET Recent Labs    11/20/17 1849 11/21/17 0419  NA 137 140  K 3.8 3.6  CL 104 105  CO2 22 21*  GLUCOSE 190* 105*  BUN 13 12  CREATININE 1.30* 1.09*  CALCIUM 9.2 8.9   LFT Recent Labs    11/21/17 0419  PROT 7.0  ALBUMIN 3.1*  AST 76*  ALT 109*  ALKPHOS 118  BILITOT 0.9   PT/INR Recent Labs    11/20/17 2118  LABPROT 14.4  INR 1.13    Studies/Results: No results found.   Tye Savoy, NP-C @  11/21/2017, 8:46 AM  Pager number [phone]

## 2017-11-21 NOTE — Progress Notes (Signed)
PROGRESS NOTE    Becky Vazquez  TOI:[id number] DOB: [birth year] DOA: 11/20/2017 PCP: Becky Jewel, MD     Brief Narrative:  Becky Vazquez is a 47 yo female with medical history significant of history of cholecystectomy who is followed by GI presenting with recurrent abdominal pain, nausea, vomiting. Patient was seen in the ED 2 days ago and discharged home. At that time no significant finding was found on CT. She came back due to persistent nausea and vomiting as well as abdominal pain. Pain was rated as 9 out of 10 aggravated by activities and meals radiating to her back. Pain was burning in sensation but now sharp. She has slight improvement yesterday after coming in 2 days ago however symptoms have persisted. This is her third visit therefore seeking help. She was found to have elevated lipase as well as LFTs. GI was consulted and patient admitted for further evaluation.   Assessment & Plan:   Principal Problem:   Pancreatitis due to biliary obstruction Active Problems:   OBESITY, NOS   HYPERTENSION, BENIGN   Nausea & vomiting   LFTs abnormal   Hyperglycemia   ARF (acute renal failure) (HCC)   Acute pancreatitis  Acute pancreatitis  -Patient is status post cholecystectomy -MRCP ordered -Pain improved today, no further nausea or vomiting -NPO -IV fluids -GI consulted and following  Elevated LFT -?Obstructive stone in common bile duct -LFT improving overnight with IV fluids  Acute kidney injury -Improved with IV fluids -Hold lisinopril  Essential HTN -BP controlled. Holding oral antihypertensives for now as NPO     DVT prophylaxis: Lovenox Code Status: Full Family Communication: No family at bedside Disposition Plan: Pending MRCP   Consultants:   GI  Procedures:   None   Antimicrobials:  Anti-infectives (From admission, onward)   None       Subjective: Patient feeling well today.  She received pain medication early this morning and pain is well  controlled.  No further nausea or vomiting.  Objective: Vitals:   11/21/17 0100 11/21/17 0130 11/21/17 0600 11/21/17 1308  BP: (!) 141/79 126/79 131/63 133/69  Pulse: 61 60 64 65  Resp:   20 18  Temp: 98.3 F (36.8 C)  98.6 F (37 C) 98.3 F (36.8 C)  TempSrc: Oral  Oral Oral  SpO2: 99%  100% 100%  Weight: 105.8 kg (233 lb 4 oz)     Height: 5\' 4"  (1.626 m)       Intake/Output Summary (Last 24 hours) at 11/21/2017 1332 Last data filed at 11/21/2017 0416 Gross per 24 hour  Intake 3053.33 ml  Output 500 ml  Net 2553.33 ml   Filed Weights   11/21/17 0100  Weight: 105.8 kg (233 lb 4 oz)    Examination:  General exam: Appears calm and comfortable  Respiratory system: Clear to auscultation. Respiratory effort normal. Cardiovascular system: S1 & S2 heard, RRR. No JVD, murmurs, rubs, gallops or clicks. No pedal edema. Gastrointestinal system: Abdomen is nondistended, soft and TTP epigastric. No organomegaly or masses felt. Normal bowel sounds heard. Central nervous system: Alert and oriented. No focal neurological deficits. Extremities: Symmetric 5 x 5 power. Skin: No rashes, lesions or ulcers Psychiatry: Judgement and insight appear normal. Mood & affect appropriate.   Data Reviewed: I have personally reviewed following labs and imaging studies  CBC: Recent Labs  Lab 11/16/17 1241 11/18/17 0117 11/20/17 1849 11/21/17 0419  WBC 7.0 8.0 12.9* 10.6*  NEUTROABS  --  4.7  --   --  HGB 12.1 11.0* 12.4 11.4*  HCT 35.5* 33.0* 36.6 33.7*  MCV 84.9 85.7 84.3 84.0  PLT 362 364 413* 283   Basic Metabolic Panel: Recent Labs  Lab 11/16/17 1241 11/18/17 0117 11/20/17 1849 11/21/17 0419  NA 140 136 137 140  K 3.5 3.6 3.8 3.6  CL 100* 103 104 105  CO2 29 24 22  21*  GLUCOSE 123* 133* 190* 105*  BUN 14 8 13 12   CREATININE 0.92 0.80 1.30* 1.09*  CALCIUM 9.5 8.9 9.2 8.9   GFR: Estimated Creatinine Clearance: 76.5 mL/min (A) (by C-G formula based on SCr of 1.09 mg/dL  (H)). Liver Function Tests: Recent Labs  Lab 11/16/17 1241 11/18/17 0117 11/20/17 1849 11/21/17 0419  AST 17 15 130* 76*  ALT 14 11* 142* 109*  ALKPHOS 48 45 144* 118  BILITOT 0.7 0.2* 1.5* 0.9  PROT 8.0 6.7 8.1 7.0  ALBUMIN 3.5 3.2* 3.5 3.1*   Recent Labs  Lab 11/16/17 1241 11/18/17 0117 11/20/17 1849  LIPASE 24 26 2,196*   No results for input(s): AMMONIA in the last 168 hours. Coagulation Profile: Recent Labs  Lab 11/20/17 2118  INR 1.13   Cardiac Enzymes: Recent Labs  Lab 11/18/17 0330  TROPONINI <0.03   BNP (last 3 results) No results for input(s): PROBNP in the last 8760 hours. HbA1C: No results for input(s): HGBA1C in the last 72 hours. CBG: No results for input(s): GLUCAP in the last 168 hours. Lipid Profile: No results for input(s): CHOL, HDL, LDLCALC, TRIG, CHOLHDL, LDLDIRECT in the last 72 hours. Thyroid Function Tests: Recent Labs    11/21/17 0419  TSH 1.984   Anemia Panel: No results for input(s): VITAMINB12, FOLATE, FERRITIN, TIBC, IRON, RETICCTPCT in the last 72 hours. Sepsis Labs: No results for input(s): PROCALCITON, LATICACIDVEN in the last 168 hours.  No results found for this or any previous visit (from the past 240 hour(s)).     Radiology Studies: No results found.    Scheduled Meds: . enoxaparin (LOVENOX) injection  40 mg Subcutaneous Q24H  . lip balm      . pantoprazole (PROTONIX) IV  40 mg Intravenous QHS   Continuous Infusions: . lactated ringers Stopped (11/21/17 0100)  . lactated ringers 125 mL/hr at 11/21/17 1325     LOS: 1 day    Time spent: 40 minutes   Becky Phi, DO [facility] [email or web address] Password [email or web address] 11/21/2017, 1:32 PM

## 2017-11-22 ENCOUNTER — Telehealth: Payer: Self-pay

## 2017-11-22 LAB — COMPREHENSIVE METABOLIC PANEL
ALBUMIN: 2.9 g/dL — AB (ref 3.5–5.0)
ALT: 73 U/L — ABNORMAL HIGH (ref 14–54)
ANION GAP: 10 (ref 5–15)
AST: 38 U/L (ref 15–41)
Alkaline Phosphatase: 93 U/L (ref 38–126)
BUN: 7 mg/dL (ref 6–20)
CO2: 24 mmol/L (ref 22–32)
Calcium: 8.8 mg/dL — ABNORMAL LOW (ref 8.9–10.3)
Chloride: 106 mmol/L (ref 101–111)
Creatinine, Ser: 0.84 mg/dL (ref 0.44–1.00)
GFR calc non Af Amer: >60 mL/min (ref >60)
GLUCOSE: 114 mg/dL — AB (ref 65–99)
POTASSIUM: 3.5 mmol/L (ref 3.5–5.1)
SODIUM: 140 mmol/L (ref 135–145)
TOTAL PROTEIN: 6.3 g/dL — AB (ref 6.5–8.1)
Total Bilirubin: 0.7 mg/dL (ref 0.3–1.2)

## 2017-11-22 LAB — URINE CULTURE: CULTURE: NO GROWTH

## 2017-11-22 LAB — CBC
HCT: 30.2 % — ABNORMAL LOW (ref 36.0–46.0)
Hemoglobin: 10.4 g/dL — ABNORMAL LOW (ref 12.0–15.0)
MCH: 29.4 pg (ref 26.0–34.0)
MCHC: 34.4 g/dL (ref 30.0–36.0)
MCV: 85.3 fL (ref 78.0–100.0)
Platelets: 346 10*3/uL (ref 150–400)
RBC: 3.54 MIL/uL — ABNORMAL LOW (ref 3.87–5.11)
RDW: 14.2 % (ref 11.5–15.5)
WBC: 7.2 10*3/uL (ref 4.0–10.5)

## 2017-11-22 LAB — LIPID PANEL
CHOL/HDL RATIO: 4 ratio
Cholesterol: 116 mg/dL (ref 0–200)
HDL: 29 mg/dL — AB (ref >40)
LDL CALC: 58 mg/dL (ref 0–99)
TRIGLYCERIDES: 143 mg/dL (ref <150)
VLDL: 29 mg/dL (ref 0–40)

## 2017-11-22 MED ORDER — OXYCODONE HCL 5 MG PO TABS: 5.0000 mg | ORAL_TABLET | ORAL | Status: DC | PRN

## 2017-11-22 NOTE — Discharge Summary (Signed)
Physician Discharge Summary  Becky Vazquez RCV:[id number] DOB: [birth year] DOA: 11/20/2017  PCP: Antonietta Jewel, MD  Admit date: 11/20/2017 Discharge date: 11/22/2017  Admitted From: Home Disposition:  Home  Recommendations for Outpatient Follow-up:  1. Follow up with Dr. Hilarie Fredrickson, GI, in 1 week  Discharge Condition: Stable CODE STATUS: Full  Diet recommendation: Slowly advance to soft GI diet   Brief/Interim Summary: Becky Vazquez is a 47 yo female with medical history significant ofhistory of cholecystectomy who is followed by GI presenting with recurrent abdominal pain, nausea, vomiting. Patient was seen in the ED 2 days ago and discharged home. At that time no significant finding was found on CT. She came back due to persistent nausea and vomiting as well as abdominal pain. Pain was rated as 9 out of 10 aggravated by activities and meals radiating to her back. Pain was burning in sensation but now sharp. She has slight improvement yesterday after coming in 2 days ago however symptoms have persisted. This is her third visit therefore seeking help. She was found to have elevated lipase as well as LFTs. GI was consulted and patient admitted for further evaluation. MRCP was ordered but patient unable to tolerate it. Patient was treated with supportive care and slowly started to improve. Her diet was advanced with no further nausea/vomiting or worsening abdominal pain. Her acute pancreatitis was most likely secondary to biliary, possibly retained CBD stone vs microlithiasis. She is to follow up with GI for outpatient EUS.   Discharge Diagnoses:  Principal Problem:   Pancreatitis due to biliary obstruction Active Problems:   OBESITY, NOS   HYPERTENSION, BENIGN   Nausea & vomiting   LFTs abnormal   Hyperglycemia   ARF (acute renal failure) (HCC)   Acute pancreatitis  Acute pancreatitis  -Patient is status post cholecystectomy -MRCP ordered but patient unable to tolerate  -Pain improved  today, no further nausea or vomiting -Diet advanced and patient tolerating well -Follow up with GI for outpatient EUS   Elevated LFT -?Obstructive stone in common bile duct -LFT improving  Acute kidney injury -Improved with IV fluids -Resolved   Essential HTN -BP controlled    Discharge Instructions  Discharge Instructions    Call MD for:  difficulty breathing, headache or visual disturbances   Complete by:  As directed    Call MD for:  extreme fatigue   Complete by:  As directed    Call MD for:  persistant dizziness or light-headedness   Complete by:  As directed    Call MD for:  persistant nausea and vomiting   Complete by:  As directed    Call MD for:  severe uncontrolled pain   Complete by:  As directed    Call MD for:  temperature >100.4   Complete by:  As directed    Discharge instructions   Complete by:  As directed    You were cared for by a hospitalist during your hospital stay. If you have any questions about your discharge medications or the care you received while you were in the hospital after you are discharged, you can call the unit and asked to speak with the hospitalist on call if the hospitalist that took care of you is not available. Once you are discharged, your primary care physician will handle any further medical issues. Please note that NO REFILLS for any discharge medications will be authorized once you are discharged, as it is imperative that you return to your primary care physician (or establish a relationship  with a primary care physician if you do not have one) for your aftercare needs so that they can reassess your need for medications and monitor your lab values.   Increase activity slowly   Complete by:  As directed      Allergies as of 11/22/2017      Reactions   Promethazine-dm    Loss of sensation.       Medication List    STOP taking these medications   sulfamethoxazole-trimethoprim 800-160 MG tablet Commonly known as:  BACTRIM  DS,SEPTRA DS     TAKE these medications   atenolol 25 MG tablet Commonly known as:  TENORMIN Take 25 mg by mouth daily.   BIOTIN PO Take 1 tablet by mouth daily.   cyanocobalamin 2000 MCG tablet Take 2,000 mcg by mouth daily.   fluticasone 50 MCG/ACT nasal spray Commonly known as:  FLONASE Place 2 sprays into both nostrils daily.   hydrochlorothiazide 25 MG tablet Commonly known as:  HYDRODIURIL Take 1 tablet by mouth daily.   JINTELI 1-5 MG-MCG Tabs tablet Generic drug:  norethindrone-ethinyl estradiol Take 1 tablet by mouth daily.   liothyronine 25 MCG tablet Commonly known as:  CYTOMEL Take 25 mcg by mouth daily.   lisinopril 20 MG tablet Commonly known as:  PRINIVIL,ZESTRIL Take 1 tablet by mouth daily.   omeprazole 20 MG capsule Commonly known as:  PRILOSEC Take 1 capsule (20 mg total) by mouth daily.   phentermine 37.5 MG capsule Take 37.5 mg by mouth every morning.   traMADol 50 MG tablet Commonly known as:  ULTRAM Take 50 mg by mouth daily as needed for pain.   WOMENS DAILY FORMULA PO Take 1 tablet by mouth daily.      Follow-up Information    Pyrtle, Lajuan Lines, MD. Schedule an appointment as soon as possible for a visit in 1 week(s).   Specialty:  Gastroenterology Contact information: [address] [address] [phone]          Allergies  Allergen Reactions  . Promethazine-Dm     Loss of sensation.     Consultations:  GI   Procedures/Studies: Ct Abdomen Pelvis W Contrast  Result Date: 11/16/2017 CLINICAL DATA:  Right lower quadrant abdominal pain, nausea and vomiting since this morning. EXAM: CT ABDOMEN AND PELVIS WITH CONTRAST TECHNIQUE: Multidetector CT imaging of the abdomen and pelvis was performed using the standard protocol following bolus administration of intravenous contrast. CONTRAST:  128mL ISOVUE-300 IOPAMIDOL (ISOVUE-300) INJECTION 61% COMPARISON:  CT scan 03/11/2014 FINDINGS: Lower chest: The lung bases  are clear of an acute process. No worrisome pulmonary lesions or pleural effusion. The heart is borderline enlarged for age. No pericardial effusion. Hepatobiliary: No focal hepatic lesions or intrahepatic biliary dilatation. The gallbladder surgically absent. No common bile duct dilatation. Pancreas: No mass, inflammation or ductal dilatation. Spleen: Normal size.  No focal lesions. Adrenals/Urinary Tract: The adrenal glands and kidneys are unremarkable. No renal, ureteral or bladder calculi or mass. Stomach/Bowel: The stomach, duodenum, small bowel and colon are grossly normal without oral contrast. No acute inflammatory changes, mass lesions or obstructive findings. The terminal ileum is normal. The appendix is normal. Vascular/Lymphatic: The aorta is normal in caliber. No atheroscerlotic calcifications. No mesenteric of retroperitoneal mass or adenopathy. Small scattered lymph nodes are noted. Reproductive: Enlarged fibroid uterus. The largest fibroid is in the lower uterine segment region posteriorly and demonstrates degeneration and calcification. The endometrial stripe appears to be grossly normal. Both ovaries are unremarkable. Other:  No pelvic mass or adenopathy. No free pelvic fluid collections. No inguinal mass or adenopathy. No abdominal wall hernia or subcutaneous lesions. Musculoskeletal: No significant bony findings. IMPRESSION: 1. No acute abdominal/pelvic findings, mass lesions or adenopathy. 2. The appendix and right ovary are unremarkable. 3. Fibroid uterus. 4. Status post cholecystectomy.  No biliary dilatation. Electronically Signed   By: Marijo Sanes M.D.   On: 11/16/2017 14:37   Mr Abdomen Limited  Result Date: 11/21/2017 CLINICAL DATA:  Progressive epigastric pain with nausea and vomiting for 4 days. Mild elevation of the liver function studies. EXAM: MRI ABDOMEN WITHOUT CONTRAST TECHNIQUE: Multiplanar multisequence MR imaging was performed without the administration of intravenous  contrast. Due to claustrophobia, the patient was not able to complete the examination. Only axial T2 and selected thin section MRCP images were completed. COMPARISON:  CT 11/16/2017. FINDINGS: Lower chest: Trace pleural fluid and mild atelectasis at both lung bases. Borderline cardiac enlargement. The visualized lower chest otherwise appears unremarkable. Hepatobiliary: The visualized liver appears unremarkable without focal abnormality on axial T2 weighted images. The gallbladder is surgically absent. There is no biliary dilatation. The common hepatic duct measures 5 mm in diameter. No evidence of choledocholithiasis. Pancreas: Mild fatty replacement in the pancreatic head. No focal abnormality on limited imaging. Spleen:  Unremarkable. Adrenals/Urinary Tract: The adrenal glands appear normal. The kidneys appear normal. Stomach/Bowel: Visualized portions are unremarkable. Vascular/Lymphatic: No vascular abnormalities or enlarged lymph nodes are seen. There are small lymph nodes in the porta hepatis. Other:  No ascites. Musculoskeletal: Unremarkable. IMPRESSION: 1. Very limited study. Patient was not able to complete the examination due to claustrophobia, and only 4 sequences were obtained. No contrast was given. 2. No biliary dilatation or evidence of choledocholithiasis status post cholecystectomy. Electronically Signed   By: Richardean Sale M.D.   On: 11/21/2017 18:18   Mr 3d Recon At Scanner  Result Date: 11/21/2017 CLINICAL DATA:  Progressive epigastric pain with nausea and vomiting for 4 days. Mild elevation of the liver function studies. EXAM: MRI ABDOMEN WITHOUT CONTRAST TECHNIQUE: Multiplanar multisequence MR imaging was performed without the administration of intravenous contrast. Due to claustrophobia, the patient was not able to complete the examination. Only axial T2 and selected thin section MRCP images were completed. COMPARISON:  CT 11/16/2017. FINDINGS: Lower chest: Trace pleural fluid and mild  atelectasis at both lung bases. Borderline cardiac enlargement. The visualized lower chest otherwise appears unremarkable. Hepatobiliary: The visualized liver appears unremarkable without focal abnormality on axial T2 weighted images. The gallbladder is surgically absent. There is no biliary dilatation. The common hepatic duct measures 5 mm in diameter. No evidence of choledocholithiasis. Pancreas: Mild fatty replacement in the pancreatic head. No focal abnormality on limited imaging. Spleen:  Unremarkable. Adrenals/Urinary Tract: The adrenal glands appear normal. The kidneys appear normal. Stomach/Bowel: Visualized portions are unremarkable. Vascular/Lymphatic: No vascular abnormalities or enlarged lymph nodes are seen. There are small lymph nodes in the porta hepatis. Other:  No ascites. Musculoskeletal: Unremarkable. IMPRESSION: 1. Very limited study. Patient was not able to complete the examination due to claustrophobia, and only 4 sequences were obtained. No contrast was given. 2. No biliary dilatation or evidence of choledocholithiasis status post cholecystectomy. Electronically Signed   By: Richardean Sale M.D.   On: 11/21/2017 18:18      Discharge Exam: Vitals:   11/21/17 2126 11/22/17 0439  BP: (!) 133/56 137/64  Pulse: 78 68  Resp: 16 16  Temp: 98.8 F (37.1 C) 99.2 F (37.3 C)  SpO2: 100%  98%   Vitals:   11/21/17 0600 11/21/17 1308 11/21/17 2126 11/22/17 0439  BP: 131/63 133/69 (!) 133/56 137/64  Pulse: 64 65 78 68  Resp: 20 18 16 16   Temp: 98.6 F (37 C) 98.3 F (36.8 C) 98.8 F (37.1 C) 99.2 F (37.3 C)  TempSrc: Oral Oral Oral Oral  SpO2: 100% 100% 100% 98%  Weight:      Height:        General: Pt is alert, awake, not in acute distress Cardiovascular: RRR, S1/S2 +, no rubs, no gallops Respiratory: CTA bilaterally, no wheezing, no rhonchi Abdominal: Soft, NT, ND, bowel sounds + Extremities: no edema, no cyanosis    The results of significant diagnostics from this  hospitalization (including imaging, microbiology, ancillary and laboratory) are listed below for reference.     Microbiology: Recent Results (from the past 240 hour(s))  Urine culture     Status: None   Collection Time: 11/20/17 11:26 PM  Result Value Ref Range Status   Specimen Description   Final    URINE, CLEAN CATCH Performed at [facility], [place] [address], [place], [place] [address]    Special Requests   Final    NONE Performed at [facility], [place] [address], [place], [place] [address]    Culture   Final    NO GROWTH Performed at [facility], [place] [address] [place], [place] [address]    Report Status 11/22/2017 FINAL  Final     Labs: BNP (last 3 results) No results for input(s): BNP in the last 8760 hours. Basic Metabolic Panel: Recent Labs  Lab 11/16/17 1241 11/18/17 0117 11/20/17 1849 11/21/17 0419 11/22/17 0359  NA 140 136 137 140 140  K 3.5 3.6 3.8 3.6 3.5  CL 100* 103 104 105 106  CO2 29 24 22  21* 24  GLUCOSE 123* 133* 190* 105* 114*  BUN 14 8 13 12 7   CREATININE 0.92 0.80 1.30* 1.09* 0.84  CALCIUM 9.5 8.9 9.2 8.9 8.8*   Liver Function Tests: Recent Labs  Lab 11/16/17 1241 11/18/17 0117 11/20/17 1849 11/21/17 0419 11/22/17 0359  AST 17 15 130* 76* 38  ALT 14 11* 142* 109* 73*  ALKPHOS 48 45 144* 118 93  BILITOT 0.7 0.2* 1.5* 0.9 0.7  PROT 8.0 6.7 8.1 7.0 6.3*  ALBUMIN 3.5 3.2* 3.5 3.1* 2.9*   Recent Labs  Lab 11/16/17 1241 11/18/17 0117 11/20/17 1849  LIPASE 24 26 2,196*   No results for input(s): AMMONIA in the last 168 hours. CBC: Recent Labs  Lab 11/16/17 1241 11/18/17 0117 11/20/17 1849 11/21/17 0419 11/22/17 0359  WBC 7.0 8.0 12.9* 10.6* 7.2  NEUTROABS  --  4.7  --   --   --   HGB 12.1 11.0* 12.4 11.4* 10.4*  HCT 35.5* 33.0* 36.6 33.7* 30.2*  MCV 84.9 85.7 84.3 84.0 85.3  PLT 362 364 413* 347 346   Cardiac Enzymes: Recent Labs  Lab 11/18/17 0330  TROPONINI <0.03    BNP: Invalid input(s): POCBNP CBG: No results for input(s): GLUCAP in the last 168 hours. D-Dimer No results for input(s): DDIMER in the last 72 hours. Hgb A1c No results for input(s): HGBA1C in the last 72 hours. Lipid Profile Recent Labs    11/22/17 0359  CHOL 116  HDL 29*  LDLCALC 58  TRIG 143  CHOLHDL 4.0   Thyroid function studies Recent Labs    11/21/17 0419  TSH 1.984   Anemia work  up No results for input(s): VITAMINB12, FOLATE, FERRITIN, TIBC, IRON, RETICCTPCT in the last 72 hours. Urinalysis    Component Value Date/Time   COLORURINE AMBER (A) 11/20/2017 2326   APPEARANCEUR CLEAR 11/20/2017 2326   LABSPEC 1.019 11/20/2017 2326   PHURINE 5.0 11/20/2017 2326   GLUCOSEU NEGATIVE 11/20/2017 2326   HGBUR NEGATIVE 11/20/2017 2326   HGBUR negative 10/16/2009 0944   BILIRUBINUR NEGATIVE 11/20/2017 2326   BILIRUBINUR small 11/08/2014 1136   KETONESUR NEGATIVE 11/20/2017 2326   PROTEINUR 30 (A) 11/20/2017 2326   UROBILINOGEN 0.2 04/15/2016 1142   NITRITE NEGATIVE 11/20/2017 2326   LEUKOCYTESUR NEGATIVE 11/20/2017 2326   Sepsis Labs Invalid input(s): PROCALCITONIN,  WBC,  LACTICIDVEN Microbiology Recent Results (from the past 240 hour(s))  Urine culture     Status: None   Collection Time: 11/20/17 11:26 PM  Result Value Ref Range Status   Specimen Description   Final    URINE, CLEAN CATCH Performed at [facility], [place] [address], [place], [place] [address]    Special Requests   Final    NONE Performed at [facility], [place] [address], [place], [place] [address]    Culture   Final    NO GROWTH Performed at [facility], [place] [address], [place], [place] [address]    Report Status 11/22/2017 FINAL  Final     Time coordinating discharge: 30 minutes  SIGNED:  Dessa Phi, DO [facility] Pager [phone]  If 7PM-7AM, please contact night-coverage [email or web address] Password [email or web address] 11/22/2017, 1:22  PM

## 2017-11-22 NOTE — Progress Notes (Signed)
Progress Note   Subjective  Feeling overall better, abd still "sore" Has not needed pain medication since yesterday am Some flatus, no BM Tolerated clears for dinner last night and breakfast this am No nausea or vomiting No fevers Got claustrophobic in the MRI machine   Objective  Vital signs in last 24 hours: Temp:  [98.3 F (36.8 C)-99.2 F (37.3 C)] 99.2 F (37.3 C) (02/26 0439) Pulse Rate:  [65-78] 68 (02/26 0439) Resp:  [16-18] 16 (02/26 0439) BP: (133-137)/(56-69) 137/64 (02/26 0439) SpO2:  [98 %-100 %] 98 % (02/26 0439) Last BM Date: 11/20/17  General: Alert, well-developed, in NAD Heart:  Regular rate and rhythm; no murmurs Chest: Clear to ascultation bilaterally Abdomen:  Soft, mild tenderness epigastrium, nondistended. Bowel sounds are decreased but present, without guarding, and without rebound.   Extremities:  Without edema. Neurologic:  Alert and  oriented x4; grossly normal neurologically. Psych:  Alert and cooperative. Normal mood and affect.  Intake/Output from previous day: 02/25 0701 - 02/26 0700 In: 1311 [I.V.:1311] Out: -  Intake/Output this shift: No intake/output data recorded.  Lab Results: Recent Labs    11/20/17 1849 11/21/17 0419 11/22/17 0359  WBC 12.9* 10.6* 7.2  HGB 12.4 11.4* 10.4*  HCT 36.6 33.7* 30.2*  PLT 413* 347 346   BMET Recent Labs    11/20/17 1849 11/21/17 0419 11/22/17 0359  NA 137 140 140  K 3.8 3.6 3.5  CL 104 105 106  CO2 22 21* 24  GLUCOSE 190* 105* 114*  BUN 13 12 7   CREATININE 1.30* 1.09* 0.84  CALCIUM 9.2 8.9 8.8*   LFT Recent Labs    11/22/17 0359  PROT 6.3*  ALBUMIN 2.9*  AST 38  ALT 73*  ALKPHOS 93  BILITOT 0.7   PT/INR Recent Labs    11/20/17 2118  LABPROT 14.4  INR 1.13   Hepatitis Panel No results for input(s): HEPBSAG, HCVAB, HEPAIGM, HEPBIGM in the last 72 hours.  Studies/Results: Mr Abdomen Limited  Result Date: 11/21/2017 CLINICAL DATA:  Progressive epigastric pain  with nausea and vomiting for 4 days. Mild elevation of the liver function studies. EXAM: MRI ABDOMEN WITHOUT CONTRAST TECHNIQUE: Multiplanar multisequence MR imaging was performed without the administration of intravenous contrast. Due to claustrophobia, the patient was not able to complete the examination. Only axial T2 and selected thin section MRCP images were completed. COMPARISON:  CT 11/16/2017. FINDINGS: Lower chest: Trace pleural fluid and mild atelectasis at both lung bases. Borderline cardiac enlargement. The visualized lower chest otherwise appears unremarkable. Hepatobiliary: The visualized liver appears unremarkable without focal abnormality on axial T2 weighted images. The gallbladder is surgically absent. There is no biliary dilatation. The common hepatic duct measures 5 mm in diameter. No evidence of choledocholithiasis. Pancreas: Mild fatty replacement in the pancreatic head. No focal abnormality on limited imaging. Spleen:  Unremarkable. Adrenals/Urinary Tract: The adrenal glands appear normal. The kidneys appear normal. Stomach/Bowel: Visualized portions are unremarkable. Vascular/Lymphatic: No vascular abnormalities or enlarged lymph nodes are seen. There are small lymph nodes in the porta hepatis. Other:  No ascites. Musculoskeletal: Unremarkable. IMPRESSION: 1. Very limited study. Patient was not able to complete the examination due to claustrophobia, and only 4 sequences were obtained. No contrast was given. 2. No biliary dilatation or evidence of choledocholithiasis status post cholecystectomy. Electronically Signed   By: Richardean Sale M.D.   On: 11/21/2017 18:18   Mr 3d Recon At Scanner  Result Date: 11/21/2017 CLINICAL DATA:  Progressive epigastric pain with  nausea and vomiting for 4 days. Mild elevation of the liver function studies. EXAM: MRI ABDOMEN WITHOUT CONTRAST TECHNIQUE: Multiplanar multisequence MR imaging was performed without the administration of intravenous contrast. Due  to claustrophobia, the patient was not able to complete the examination. Only axial T2 and selected thin section MRCP images were completed. COMPARISON:  CT 11/16/2017. FINDINGS: Lower chest: Trace pleural fluid and mild atelectasis at both lung bases. Borderline cardiac enlargement. The visualized lower chest otherwise appears unremarkable. Hepatobiliary: The visualized liver appears unremarkable without focal abnormality on axial T2 weighted images. The gallbladder is surgically absent. There is no biliary dilatation. The common hepatic duct measures 5 mm in diameter. No evidence of choledocholithiasis. Pancreas: Mild fatty replacement in the pancreatic head. No focal abnormality on limited imaging. Spleen:  Unremarkable. Adrenals/Urinary Tract: The adrenal glands appear normal. The kidneys appear normal. Stomach/Bowel: Visualized portions are unremarkable. Vascular/Lymphatic: No vascular abnormalities or enlarged lymph nodes are seen. There are small lymph nodes in the porta hepatis. Other:  No ascites. Musculoskeletal: Unremarkable. IMPRESSION: 1. Very limited study. Patient was not able to complete the examination due to claustrophobia, and only 4 sequences were obtained. No contrast was given. 2. No biliary dilatation or evidence of choledocholithiasis status post cholecystectomy. Electronically Signed   By: Richardean Sale M.D.   On: 11/21/2017 18:18      Assessment & Plan  47 year old female presenting with acute pancreatitis (elevated lipase and classic abdominal pain; CT did not show pancreatitis)  1.  Acute pancreatitis --most likely biliary, presumption is from retained CBD stone versus microlithiasis.  She had associated elevated liver enzymes.  Initially pain was more in the right mid to lower back radiating around to the right mid abdomen but then localized more to the epigastrium reminiscent of her prior gallbladder pain. --She is improving significantly and tolerating clear liquids.  White  count has normalized.  Liver enzymes are improving; all very reassuring.  Good urine output --I have advanced her diet to full liquids, can advance as tolerated --Triglycerides are normal --Would recommend continue supportive care and then outpatient follow-up with [place]. --I have recommended EUS as an outpatient to evaluate common bile duct and pancreatic duct to rule out microlithiasis.  Should microlithiasis or retained stone be found then she could have an ERCP at the same time.  This is the most sensitive study and the MRI was suboptimal due to motion and claustrophobia. --I have added oral oxycodone to allow for an oral option should she have moderate pain.  She has not needed IV pain medication in over 24 hours --I advised that upon discharge she will need the rest of this week off work  2. AKI -- resolved    LOS: 2 days   Becky Vazquez  11/22/2017, 9:49 AM

## 2017-11-22 NOTE — H&P (View-Only) (Signed)
Progress Note   Subjective  Feeling overall better, abd still "sore" Has not needed pain medication since yesterday am Some flatus, no BM Tolerated clears for dinner last night and breakfast this am No nausea or vomiting No fevers Got claustrophobic in the MRI machine   Objective  Vital signs in last 24 hours: Temp:  [98.3 F (36.8 C)-99.2 F (37.3 C)] 99.2 F (37.3 C) (02/26 0439) Pulse Rate:  [65-78] 68 (02/26 0439) Resp:  [16-18] 16 (02/26 0439) BP: (133-137)/(56-69) 137/64 (02/26 0439) SpO2:  [98 %-100 %] 98 % (02/26 0439) Last BM Date: 11/20/17  General: Alert, well-developed, in NAD Heart:  Regular rate and rhythm; no murmurs Chest: Clear to ascultation bilaterally Abdomen:  Soft, mild tenderness epigastrium, nondistended. Bowel sounds are decreased but present, without guarding, and without rebound.   Extremities:  Without edema. Neurologic:  Alert and  oriented x4; grossly normal neurologically. Psych:  Alert and cooperative. Normal mood and affect.  Intake/Output from previous day: 02/25 0701 - 02/26 0700 In: 1311 [I.V.:1311] Out: -  Intake/Output this shift: No intake/output data recorded.  Lab Results: Recent Labs    11/20/17 1849 11/21/17 0419 11/22/17 0359  WBC 12.9* 10.6* 7.2  HGB 12.4 11.4* 10.4*  HCT 36.6 33.7* 30.2*  PLT 413* 347 346   BMET Recent Labs    11/20/17 1849 11/21/17 0419 11/22/17 0359  NA 137 140 140  K 3.8 3.6 3.5  CL 104 105 106  CO2 22 21* 24  GLUCOSE 190* 105* 114*  BUN 13 12 7   CREATININE 1.30* 1.09* 0.84  CALCIUM 9.2 8.9 8.8*   LFT Recent Labs    11/22/17 0359  PROT 6.3*  ALBUMIN 2.9*  AST 38  ALT 73*  ALKPHOS 93  BILITOT 0.7   PT/INR Recent Labs    11/20/17 2118  LABPROT 14.4  INR 1.13   Hepatitis Panel No results for input(s): HEPBSAG, HCVAB, HEPAIGM, HEPBIGM in the last 72 hours.  Studies/Results: Mr Abdomen Limited  Result Date: 11/21/2017 CLINICAL DATA:  Progressive epigastric pain  with nausea and vomiting for 4 days. Mild elevation of the liver function studies. EXAM: MRI ABDOMEN WITHOUT CONTRAST TECHNIQUE: Multiplanar multisequence MR imaging was performed without the administration of intravenous contrast. Due to claustrophobia, the patient was not able to complete the examination. Only axial T2 and selected thin section MRCP images were completed. COMPARISON:  CT 11/16/2017. FINDINGS: Lower chest: Trace pleural fluid and mild atelectasis at both lung bases. Borderline cardiac enlargement. The visualized lower chest otherwise appears unremarkable. Hepatobiliary: The visualized liver appears unremarkable without focal abnormality on axial T2 weighted images. The gallbladder is surgically absent. There is no biliary dilatation. The common hepatic duct measures 5 mm in diameter. No evidence of choledocholithiasis. Pancreas: Mild fatty replacement in the pancreatic head. No focal abnormality on limited imaging. Spleen:  Unremarkable. Adrenals/Urinary Tract: The adrenal glands appear normal. The kidneys appear normal. Stomach/Bowel: Visualized portions are unremarkable. Vascular/Lymphatic: No vascular abnormalities or enlarged lymph nodes are seen. There are small lymph nodes in the porta hepatis. Other:  No ascites. Musculoskeletal: Unremarkable. IMPRESSION: 1. Very limited study. Patient was not able to complete the examination due to claustrophobia, and only 4 sequences were obtained. No contrast was given. 2. No biliary dilatation or evidence of choledocholithiasis status post cholecystectomy. Electronically Signed   By: Richardean Sale M.D.   On: 11/21/2017 18:18   Mr 3d Recon At Scanner  Result Date: 11/21/2017 CLINICAL DATA:  Progressive epigastric pain with  nausea and vomiting for 4 days. Mild elevation of the liver function studies. EXAM: MRI ABDOMEN WITHOUT CONTRAST TECHNIQUE: Multiplanar multisequence MR imaging was performed without the administration of intravenous contrast. Due  to claustrophobia, the patient was not able to complete the examination. Only axial T2 and selected thin section MRCP images were completed. COMPARISON:  CT 11/16/2017. FINDINGS: Lower chest: Trace pleural fluid and mild atelectasis at both lung bases. Borderline cardiac enlargement. The visualized lower chest otherwise appears unremarkable. Hepatobiliary: The visualized liver appears unremarkable without focal abnormality on axial T2 weighted images. The gallbladder is surgically absent. There is no biliary dilatation. The common hepatic duct measures 5 mm in diameter. No evidence of choledocholithiasis. Pancreas: Mild fatty replacement in the pancreatic head. No focal abnormality on limited imaging. Spleen:  Unremarkable. Adrenals/Urinary Tract: The adrenal glands appear normal. The kidneys appear normal. Stomach/Bowel: Visualized portions are unremarkable. Vascular/Lymphatic: No vascular abnormalities or enlarged lymph nodes are seen. There are small lymph nodes in the porta hepatis. Other:  No ascites. Musculoskeletal: Unremarkable. IMPRESSION: 1. Very limited study. Patient was not able to complete the examination due to claustrophobia, and only 4 sequences were obtained. No contrast was given. 2. No biliary dilatation or evidence of choledocholithiasis status post cholecystectomy. Electronically Signed   By: Richardean Sale M.D.   On: 11/21/2017 18:18      Assessment & Plan  47 year old female presenting with acute pancreatitis (elevated lipase and classic abdominal pain; CT did not show pancreatitis)  1.  Acute pancreatitis --most likely biliary, presumption is from retained CBD stone versus microlithiasis.  She had associated elevated liver enzymes.  Initially pain was more in the right mid to lower back radiating around to the right mid abdomen but then localized more to the epigastrium reminiscent of her prior gallbladder pain. --She is improving significantly and tolerating clear liquids.  White  count has normalized.  Liver enzymes are improving; all very reassuring.  Good urine output --I have advanced her diet to full liquids, can advance as tolerated --Triglycerides are normal --Would recommend continue supportive care and then outpatient follow-up with [place]. --I have recommended EUS as an outpatient to evaluate common bile duct and pancreatic duct to rule out microlithiasis.  Should microlithiasis or retained stone be found then she could have an ERCP at the same time.  This is the most sensitive study and the MRI was suboptimal due to motion and claustrophobia. --I have added oral oxycodone to allow for an oral option should she have moderate pain.  She has not needed IV pain medication in over 24 hours --I advised that upon discharge she will need the rest of this week off work  2. AKI -- resolved    LOS: 2 days   Jerene Bears  11/22/2017, 9:49 AM

## 2017-11-22 NOTE — Discharge Instructions (Signed)
Acute Pancreatitis Acute pancreatitis is a condition in which the pancreas suddenly becomes irritated and swollen (has inflammation). The pancreas is a gland that is located behind the stomach. It produces enzymes that help to digest food. The pancreas also releases the hormones glucagon and insulin, which help to regulate blood sugar. Damage to the pancreas occurs when the digestive enzymes from the pancreas are activated before they are released into the intestine. Most acute attacks last a couple of days and can cause serious problems. Some people become dehydrated and develop low blood pressure. In severe cases, bleeding into the pancreas can lead to shock and can be life-threatening. The lungs, heart, and kidneys may fail. What are the causes? The most common causes of this condition are:  Alcohol abuse.  Gallstones.  Other causes include:  Certain medicines.  Exposure to certain chemicals.  Infection.  Damage caused by an accident (trauma).  Abdominal surgery.  In some cases, the cause may not be known. What are the signs or symptoms? Symptoms of this condition include:  Pain in the upper abdomen that may radiate to the back.  Tenderness and swelling of the abdomen.  Nausea and vomiting.  How is this diagnosed? This condition may be diagnosed based on:  A physical exam.  Blood tests.  Imaging tests, such as X-rays, CT scans, or an ultrasound of the abdomen.  How is this treated? Treatment for this condition usually requires a stay in the hospital. Treatment may include:  Pain medicine.  Fluid replacement through an IV tube.  Placing a tube in the stomach to remove stomach contents and to control vomiting (NG tube, or nasogastric tube).  Not eating for 3-4 days. This gives the pancreas a rest, because enzymes are not being produced that can cause further damage.  Antibiotic medicines, if your condition is caused by an infection.  Surgery on the pancreas or  gallbladder.  Follow these instructions at home: Eating and drinking  Follow instructions from your health care provider about diet. This may involve avoiding alcohol and decreasing the amount of fat in your diet.  Eat smaller, more frequent meals. This reduces the amount of digestive fluids that the pancreas produces.  Drink enough fluid to keep your urine clear or pale yellow.  Do not drink alcohol if it caused your condition. General instructions  Take over-the-counter and prescription medicines only as told by your health care provider.  Do not use any tobacco products, such as cigarettes, chewing tobacco, and e-cigarettes. If you need help quitting, ask your health care provider.  Get plenty of rest.  If directed, check your blood sugar at home as told by your health care provider.  Keep all follow-up visits as told by your health care provider. This is important. Contact a health care provider if:  You do not recover as quickly as expected.  You develop new or worsening symptoms.  You have persistent pain, weakness, or nausea.  You recover and then have another episode of pain.  You have a fever. Get help right away if:  You cannot eat or keep fluids down.  Your pain becomes severe.  Your skin or the white part of your eyes turns yellow (jaundice).  You vomit.  You feel dizzy or you faint.  Your blood sugar is high (over 300 mg/dL). This information is not intended to replace advice given to you by your health care provider. Make sure you discuss any questions you have with your health care provider. Document Released:  09/13/2005 Document Revised: 01/21/2016 Document Reviewed: 06/17/2015 Elsevier Interactive Patient Education  2018 [place] Meal Plan A soft-food meal plan includes foods that are safe and easy to swallow. This meal plan typically is used:  If you are having trouble chewing or swallowing foods.  As a transition meal  plan after only having had liquid meals for a long period.  What do I need to know about the soft-food meal plan? A soft-food meal plan includes tender foods that are soft and easy to chew and swallow. In most cases, bite-sized pieces of food are easier to swallow. A bite-sized piece is about  inch or smaller. Foods in this plan do not need to be ground or pureed. Foods that are very hard, crunchy, or sticky should be avoided. Also, breads, cereals, yogurts, and desserts with nuts, seeds, or fruits should be avoided. What foods can I eat? Grains Rice and wild rice. Moist bread, dressing, pasta, and noodles. Well-moistened dry or cooked cereals, such as farina (cooked wheat cereal), oatmeal, or grits. Biscuits, breads, muffins, pancakes, and waffles that have been well moistened. Vegetables Shredded lettuce. Cooked, tender vegetables, including potatoes without skins. Vegetable juices. Broths or creamed soups made with vegetables that are not stringy or chewy. Strained tomatoes (without seeds). Fruits Canned or well-cooked fruits. Soft (ripe), peeled fresh fruits, such as peaches, nectarines, kiwi, cantaloupe, honeydew melon, and watermelon (without seeds). Soft berries with small seeds, such as strawberries. Fruit juices (without pulp). Meats and Other Protein Sources Moist, tender, lean beef. Mutton. Lamb. Veal. Chicken. [place]. Liver. Ham. Fish without bones. Eggs. Dairy Milk, milk drinks, and cream. Plain cream cheese and cottage cheese. Plain yogurt. Sweets/Desserts Flavored gelatin desserts. Custard. Plain ice cream, frozen yogurt, sherbet, milk shakes, and malts. Plain cakes and cookies. Plain hard candy. Other Butter, margarine (without trans fat), and cooking oils. Mayonnaise. Cream sauces. Mild spices, salt, and sugar. Syrup, molasses, honey, and jelly. The items listed above may not be a complete list of recommended foods or beverages. Contact your dietitian for more options. What  foods are not recommended? Grains Dry bread, toast, crackers that have not been moistened. Coarse or dry cereals, such as bran, granola, and shredded wheat. Tough or chewy crusty breads, such as [place] bread or baguettes. Vegetables Corn. Raw vegetables except shredded lettuce. Cooked vegetables that are tough or stringy. Tough, crisp, fried potatoes and potato skins. Fruits Fresh fruits with skins or seeds or both, such as apples, pears, or grapes. Stringy, high-pulp fruits, such as papaya, pineapple, coconut, or mango. Fruit leather, fruit roll-ups, and all dried fruits. Meats and Other Protein Sources Sausages and hot dogs. Meats with gristle. Fish with bones. Nuts, seeds, and chunky peanut or other nut butters. Sweets/Desserts Cakes or cookies that are very dry or chewy. The items listed above may not be a complete list of foods and beverages to avoid. Contact your dietitian for more information. This information is not intended to replace advice given to you by your health care provider. Make sure you discuss any questions you have with your health care provider. Document Released: 12/21/2007 Document Revised: 02/19/2016 Document Reviewed: 08/10/2013 Elsevier Interactive Patient Education  2017 [employer].

## 2017-11-22 NOTE — Telephone Encounter (Signed)
[name], MD  Pyrtle, Lajuan Lines, MD; Willia Craze, NP  Cc: Jeoffrey Massed, RN        Ulice Dash,  Sounds like a good plan. Will aim for about a month from now to allow a bit more time for her AP to resolve.   Jonai Weyland,  She needs upper EUS +/- ERCP in 3-4 weeks from now on a Thursday MAC day. For pancreatitis, ? Retained stone.   Thanks

## 2017-11-24 ENCOUNTER — Other Ambulatory Visit: Payer: Self-pay

## 2017-11-24 DIAGNOSIS — K851 Biliary acute pancreatitis without necrosis or infection: Secondary | ICD-10-CM

## 2017-11-24 NOTE — Telephone Encounter (Signed)
EUS,ERCP scheduled, pt instructed and medications reviewed.  Patient instructions mailed to home.  Patient to call with any questions or concerns.

## 2017-12-07 ENCOUNTER — Other Ambulatory Visit: Payer: Self-pay

## 2017-12-07 ENCOUNTER — Encounter (HOSPITAL_COMMUNITY): Payer: Self-pay | Admitting: Emergency Medicine

## 2017-12-15 ENCOUNTER — Ambulatory Visit (HOSPITAL_COMMUNITY)
Admission: RE | Admit: 2017-12-15 | Discharge: 2017-12-15 | Disposition: A | Discharge status: Home / Self Care | Payer: Managed Care, Other (non HMO) | Source: Ambulatory Visit | Attending: Gastroenterology | Admitting: Gastroenterology

## 2017-12-15 ENCOUNTER — Encounter (HOSPITAL_COMMUNITY)
Admission: RE | Disposition: A | Discharge status: Home / Self Care | Payer: Self-pay | Source: Ambulatory Visit | Attending: Gastroenterology

## 2017-12-15 ENCOUNTER — Encounter (HOSPITAL_COMMUNITY): Payer: Self-pay

## 2017-12-15 ENCOUNTER — Ambulatory Visit (HOSPITAL_COMMUNITY): Payer: Managed Care, Other (non HMO) | Admitting: Anesthesiology

## 2017-12-15 ENCOUNTER — Other Ambulatory Visit: Payer: Self-pay

## 2017-12-15 DIAGNOSIS — Z9049 Acquired absence of other specified parts of digestive tract: Secondary | ICD-10-CM | POA: Insufficient documentation

## 2017-12-15 DIAGNOSIS — R1013 Epigastric pain: Secondary | ICD-10-CM | POA: Diagnosis not present

## 2017-12-15 DIAGNOSIS — Z79890 Hormone replacement therapy (postmenopausal): Secondary | ICD-10-CM | POA: Diagnosis not present

## 2017-12-15 DIAGNOSIS — Z79899 Other long term (current) drug therapy: Secondary | ICD-10-CM | POA: Diagnosis not present

## 2017-12-15 DIAGNOSIS — Z8719 Personal history of other diseases of the digestive system: Secondary | ICD-10-CM | POA: Insufficient documentation

## 2017-12-15 DIAGNOSIS — K859 Acute pancreatitis without necrosis or infection, unspecified: Secondary | ICD-10-CM

## 2017-12-15 DIAGNOSIS — I10 Essential (primary) hypertension: Secondary | ICD-10-CM | POA: Insufficient documentation

## 2017-12-15 DIAGNOSIS — Z7951 Long term (current) use of inhaled steroids: Secondary | ICD-10-CM | POA: Diagnosis not present

## 2017-12-15 DIAGNOSIS — Z79891 Long term (current) use of opiate analgesic: Secondary | ICD-10-CM | POA: Insufficient documentation

## 2017-12-15 DIAGNOSIS — F40240 Claustrophobia: Secondary | ICD-10-CM | POA: Diagnosis not present

## 2017-12-15 DIAGNOSIS — K851 Biliary acute pancreatitis without necrosis or infection: Secondary | ICD-10-CM

## 2017-12-15 HISTORY — PX: EUS: SHX5427

## 2017-12-15 SURGERY — UPPER ENDOSCOPIC ULTRASOUND (EUS) LINEAR
Anesthesia: General

## 2017-12-15 MED ORDER — ONDANSETRON HCL 4 MG/2ML IJ SOLN
INTRAMUSCULAR | Status: DC | PRN
  Administered 2017-12-15: 4 mg via INTRAVENOUS

## 2017-12-15 MED ORDER — LACTATED RINGERS IV SOLN
INTRAVENOUS | Status: DC
  Administered 2017-12-15: 10:00:00 via INTRAVENOUS

## 2017-12-15 MED ORDER — INDOMETHACIN 50 MG RE SUPP
RECTAL | Status: AC
  Filled 2017-12-15: qty 2

## 2017-12-15 MED ORDER — PROPOFOL 10 MG/ML IV BOLUS
INTRAVENOUS | Status: AC
  Filled 2017-12-15: qty 40

## 2017-12-15 MED ORDER — PROPOFOL 500 MG/50ML IV EMUL
INTRAVENOUS | Status: DC | PRN
  Administered 2017-12-15: 10 mg via INTRAVENOUS
  Administered 2017-12-15: 20 mg via INTRAVENOUS
  Administered 2017-12-15: 40 mg via INTRAVENOUS
  Administered 2017-12-15: 20 mg via INTRAVENOUS
  Administered 2017-12-15: 10 mg via INTRAVENOUS

## 2017-12-15 MED ORDER — CIPROFLOXACIN IN D5W 400 MG/200ML IV SOLN
INTRAVENOUS | Status: AC
  Filled 2017-12-15: qty 200

## 2017-12-15 MED ORDER — GLUCAGON HCL RDNA (DIAGNOSTIC) 1 MG IJ SOLR
INTRAMUSCULAR | Status: AC
  Filled 2017-12-15: qty 1

## 2017-12-15 MED ORDER — SODIUM CHLORIDE 0.9 % IV SOLN: INTRAVENOUS | Status: DC

## 2017-12-15 MED ORDER — PROPOFOL 500 MG/50ML IV EMUL
INTRAVENOUS | Status: DC | PRN
  Administered 2017-12-15: 150 ug/kg/min via INTRAVENOUS

## 2017-12-15 MED ORDER — PROPOFOL 10 MG/ML IV BOLUS
INTRAVENOUS | Status: AC
  Filled 2017-12-15: qty 20

## 2017-12-15 NOTE — Op Note (Signed)
[facility] Patient Name: Becky Vazquez Procedure Date: 12/15/2017 MRN: [id number] Attending MD: [name] , MD Date of Birth: [birth year] CSN: [id number] Age: 47 Admit Type: Outpatient Procedure:                Upper EUS Indications:              Acute pancreatitis last month with elevated liver                            tests, remote lap chole (HP). MRCP during recent                            admission showed normal bile duct without stones                            but was very limited due to pt claustrophobia. Providers:                [name], MD, Burtis Junes, RN, Elspeth Cho                            Tech., Technician, Rosario Adie, CRNA Referring MD:             Zenovia Jarred, MD Medicines:                Monitored Anesthesia Care Complications:            No immediate complications. Estimated blood loss:                            None. Estimated Blood Loss:     Estimated blood loss: none. Procedure:                Pre-Anesthesia Assessment:                           - Prior to the procedure, a History and Physical                            was performed, and patient medications and                            allergies were reviewed. The patient's tolerance of                            previous anesthesia was also reviewed. The risks                            and benefits of the procedure and the sedation                            options and risks were discussed with the patient.                            All questions were answered, and informed consent  was obtained. Prior Anticoagulants: The patient has                            taken no previous anticoagulant or antiplatelet                            agents. ASA Grade Assessment: II - A patient with                            mild systemic disease. After reviewing the risks                            and benefits, the patient was deemed in                     satisfactory condition to undergo the procedure.                           After obtaining informed consent, the endoscope was                            passed under direct vision. Throughout the                            procedure, the patient's blood pressure, pulse, and                            oxygen saturations were monitored continuously. The                            [id number] ([id number]) scope was introduced through                            the mouth, and advanced to the second part of                            duodenum. The upper EUS was accomplished without                            difficulty. The patient tolerated the procedure                            well. Scope In: Scope Out: Findings:      Endoscopic Finding :      1. The UGI tract was normal (with radial echoendoscope)      Endosonographic Finding :      1. The extrehepatic biliary tree was normal (CBD maximum diameter of the       duct was 3 mm and it did not contain any stones).      2. Main pancreatic duct was normal.      3. Pancreatic parenchyma was normal (no masses or signs of chronic       pancreatitis).      4. Gallbladder surgically absent      5. Limited views of liver, spleen, portal and splenic vessels were all  normal. Impression:               - Normal extrahepatic biliary tree (non-dilated and                            no stones). Moderate Sedation:      N/A- Per Anesthesia Care Recommendation:           - Discharge patient to home.                           - For now, please stop your hydrochlorothiazide                            (blood pressure medicine) as it may have been the                            cause of your recent pancreatitis. Call your PCP in                            the next few days to consider a replacement blood                            pressure medicine. Procedure Code(s):        --- Professional ---                           [id number],  Esophagogastroduodenoscopy, flexible,                            transoral; with endoscopic ultrasound examination                            limited to the esophagus, stomach or duodenum, and                            adjacent structures Diagnosis Code(s):        --- Professional ---                           K85.90, Acute pancreatitis without necrosis or                            infection, unspecified CPT copyright 2016 American Medical Association. All rights reserved. The codes documented in this report are preliminary and upon coder review may  be revised to meet current compliance requirements. [name], MD 12/15/2017 11:22:23 AM This report has been signed electronically. Number of Addenda: 0

## 2017-12-15 NOTE — Anesthesia Postprocedure Evaluation (Signed)
Anesthesia Post Note  Patient: Becky Vazquez  Procedure(s) Performed: UPPER ENDOSCOPIC ULTRASOUND (EUS) LINEAR (N/A )     Patient location during evaluation: Endoscopy Anesthesia Type: General Level of consciousness: awake Pain management: pain level controlled Vital Signs Assessment: post-procedure vital signs reviewed and stable Respiratory status: spontaneous breathing Cardiovascular status: stable Anesthetic complications: no    Last Vitals:  Vitals Value Taken Time  BP    Temp    Pulse    Resp    SpO2      Last Pain:  Vitals:   12/15/17 1135  TempSrc:   PainSc: 0-No pain                 Zakia Sainato

## 2017-12-15 NOTE — Anesthesia Preprocedure Evaluation (Addendum)
Anesthesia Evaluation  Patient identified by MRN, date of birth, ID band Patient awake    Reviewed: Allergy & Precautions, NPO status , Patient's Chart, lab work & pertinent test results  History of Anesthesia Complications (+) PSEUDOCHOLINESTERASE DEFICIENCY  Airway Mallampati: II       Dental   Pulmonary neg pulmonary ROS,    breath sounds clear to auscultation       Cardiovascular hypertension,  Rhythm:Regular Rate:Normal     Neuro/Psych    GI/Hepatic negative GI ROS, Neg liver ROS,   Endo/Other    Renal/GU Renal disease     Musculoskeletal   Abdominal   Peds  Hematology  (+) anemia ,   Anesthesia Other Findings   Reproductive/Obstetrics                            Anesthesia Physical Anesthesia Plan  ASA: III  Anesthesia Plan: MAC   Post-op Pain Management:    Induction: Intravenous  PONV Risk Score and Plan: 3 and Treatment may vary due to age or medical condition, Ondansetron and Midazolam  Airway Management Planned: Simple Face Mask and Nasal Cannula  Additional Equipment:   Intra-op Plan:   Post-operative Plan:   Informed Consent: I have reviewed the patients History and Physical, chart, labs and discussed the procedure including the risks, benefits and alternatives for the proposed anesthesia with the patient or authorized representative who has indicated his/her understanding and acceptance.   Dental advisory given  Plan Discussed with: CRNA and Anesthesiologist  Anesthesia Plan Comments:        Anesthesia Quick Evaluation

## 2017-12-15 NOTE — Interval H&P Note (Signed)
History and Physical Interval Note:  12/15/2017 10:13 AM  Becky Vazquez  has presented today for surgery, with the diagnosis of pancreatitis, retained stone  The various methods of treatment have been discussed with the patient and family. After consideration of risks, benefits and other options for treatment, the patient has consented to  Procedure(s): UPPER ENDOSCOPIC ULTRASOUND (EUS) LINEAR (N/A) ENDOSCOPIC RETROGRADE CHOLANGIOPANCREATOGRAPHY (ERCP) WITH PROPOFOL (N/A) as a surgical intervention .  The patient's history has been reviewed, patient examined, no change in status, stable for surgery.  I have reviewed the patient's chart and labs.  Questions were answered to the patient's satisfaction.     [name]

## 2017-12-15 NOTE — Anesthesia Procedure Notes (Signed)
Date/Time: 12/15/2017 10:53 AM Performed by: Glory Buff, CRNA Oxygen Delivery Method: Nasal cannula

## 2017-12-15 NOTE — Transfer of Care (Signed)
Immediate Anesthesia Transfer of Care Note  Patient: Becky Vazquez  Procedure(s) Performed: UPPER ENDOSCOPIC ULTRASOUND (EUS) LINEAR (N/A ) ENDOSCOPIC RETROGRADE CHOLANGIOPANCREATOGRAPHY (ERCP) WITH PROPOFOL (N/A )  Patient Location: PACU  Anesthesia Type:MAC  Level of Consciousness: awake, alert  and oriented  Airway & Oxygen Therapy: Patient Spontanous Breathing and Patient connected to nasal cannula oxygen  Post-op Assessment: Report given to RN  Post vital signs: Reviewed and stable  Last Vitals:  Vitals Value Taken Time  BP    Temp    Pulse    Resp    SpO2      Last Pain:  Vitals:   12/15/17 0957  TempSrc: Oral  PainSc: 0-No pain         Complications: No apparent anesthesia complications

## 2017-12-15 NOTE — Discharge Instructions (Signed)
YOU HAD AN ENDOSCOPIC PROCEDURE TODAY: Refer to the procedure report and other information in the discharge instructions given to you for any specific questions about what was found during the examination. If this information does not answer your questions, please call [place] office at [phone] to clarify.  ° °YOU SHOULD EXPECT: Some feelings of bloating in the abdomen. Passage of more gas than usual. Walking can help get rid of the air that was put into your GI tract during the procedure and reduce the bloating.. ° °DIET: Your first meal following the procedure should be a light meal and then it is ok to progress to your normal diet. A half-sandwich or bowl of soup is an example of a good first meal. Heavy or fried foods are harder to digest and may make you feel nauseous or bloated. Drink plenty of fluids but you should avoid alcoholic beverages for 24 hours. If you had a esophageal dilation, please see attached instructions for diet.   ° °ACTIVITY: Your care partner should take you home directly after the procedure. You should plan to take it easy, moving slowly for the rest of the day. You can resume normal activity the day after the procedure however YOU SHOULD NOT DRIVE, use power tools, machinery or perform tasks that involve climbing or major physical exertion for 24 hours (because of the sedation medicines used during the test).  ° °SYMPTOMS TO REPORT IMMEDIATELY: °A gastroenterologist can be reached at any hour. Please call [phone]  for any of the following symptoms:  ° °Following upper endoscopy (EGD, EUS, ERCP, esophageal dilation) °Vomiting of blood or coffee ground material  °New, significant abdominal pain  °New, significant chest pain or pain under the shoulder blades  °Painful or persistently difficult swallowing  °New shortness of breath  °Black, tarry-looking or red, bloody stools ° °FOLLOW UP:  °If any biopsies were taken you will be contacted by phone or by letter within the next 1-3  weeks. Call [phone]  if you have not heard about the biopsies in 3 weeks.  °Please also call with any specific questions about appointments or follow up tests. ° °

## 2017-12-16 ENCOUNTER — Encounter (HOSPITAL_COMMUNITY): Payer: Self-pay | Admitting: Gastroenterology

## 2017-12-16 NOTE — Addendum Note (Signed)
Addendum  created 12/16/17 2327 by Belinda Block, MD   Sign clinical note

## 2017-12-16 NOTE — Anesthesia Postprocedure Evaluation (Signed)
Anesthesia Post Note  Patient: Becky Vazquez  Procedure(s) Performed: UPPER ENDOSCOPIC ULTRASOUND (EUS) LINEAR (N/A )     Patient location during evaluation: PACU Anesthesia Type: MAC Level of consciousness: awake Pain management: pain level controlled Respiratory status: spontaneous breathing Cardiovascular status: stable Anesthetic complications: no    Last Vitals:  Vitals:   12/15/17 1125 12/15/17 1135  BP: 138/81 (!) 150/74  Pulse: 71 61  Resp: 17 13  Temp:    SpO2: 100% 100%    Last Pain:  Vitals:   12/15/17 1135  TempSrc:   PainSc: 0-No pain                 Shawna Wearing

## 2018-04-12 ENCOUNTER — Telehealth: Payer: Self-pay | Admitting: Internal Medicine

## 2018-04-12 DIAGNOSIS — R945 Abnormal results of liver function studies: Secondary | ICD-10-CM

## 2018-04-12 DIAGNOSIS — R7989 Other specified abnormal findings of blood chemistry: Secondary | ICD-10-CM

## 2018-04-12 DIAGNOSIS — R109 Unspecified abdominal pain: Secondary | ICD-10-CM

## 2018-04-12 NOTE — Telephone Encounter (Signed)
Patient states she is still having gi symptoms and would like to speak with a nurse regarding next steps she should take.

## 2018-04-13 NOTE — Telephone Encounter (Signed)
Patient seen during hospitalization earlier this year and then had EUS with Dr. Ardis Hughs which showed a normal upper EUS with no evidence of retained bile duct stone  Would have her come back for labs as liver enzymes were previously elevated Please check CBC, CMP and lipase Would also recommend she start MiraLAX 17 g daily to avoid constipation, this is not an aggressive laxative and should not cause diarrhea but should prevent constipation She can also be offered follow-up in the office

## 2018-04-13 NOTE — Telephone Encounter (Signed)
Pt states that Tuesday she had an episode like she had back in February where she felt nauseated like she needed to vomit but she did not. She also states she felt like she had to have BM. Reports eventually she had a couple of BM's and felt better. Pt was concerned and wants to know what she can do to try and prevent episodes like this. Please advise.

## 2018-04-14 NOTE — Telephone Encounter (Signed)
Spoke with pt and she is aware and will come for labs, orders in epic.

## 2018-04-21 ENCOUNTER — Other Ambulatory Visit (INDEPENDENT_AMBULATORY_CARE_PROVIDER_SITE_OTHER): Payer: Managed Care, Other (non HMO)

## 2018-04-21 DIAGNOSIS — R945 Abnormal results of liver function studies: Secondary | ICD-10-CM

## 2018-04-21 DIAGNOSIS — R109 Unspecified abdominal pain: Secondary | ICD-10-CM

## 2018-04-21 DIAGNOSIS — R7989 Other specified abnormal findings of blood chemistry: Secondary | ICD-10-CM

## 2018-04-21 LAB — COMPREHENSIVE METABOLIC PANEL
ALK PHOS: 44 U/L (ref 39–117)
ALT: 9 U/L (ref 0–35)
AST: 10 U/L (ref 0–37)
Albumin: 3.8 g/dL (ref 3.5–5.2)
BUN: 10 mg/dL (ref 6–23)
CHLORIDE: 99 meq/L (ref 96–112)
CO2: 33 mEq/L — ABNORMAL HIGH (ref 19–32)
CREATININE: 0.89 mg/dL (ref 0.40–1.20)
Calcium: 9 mg/dL (ref 8.4–10.5)
GFR: 87.32 mL/min (ref >60.00)
GLUCOSE: 132 mg/dL — AB (ref 70–99)
Potassium: 3.7 mEq/L (ref 3.5–5.1)
SODIUM: 139 meq/L (ref 135–145)
TOTAL PROTEIN: 7.6 g/dL (ref 6.0–8.3)
Total Bilirubin: 0.6 mg/dL (ref 0.2–1.2)

## 2018-04-21 LAB — CBC WITH DIFFERENTIAL/PLATELET
BASOS PCT: 0.6 % (ref 0.0–3.0)
Basophils Absolute: 0 10*3/uL (ref 0.0–0.1)
EOS PCT: 2.5 % (ref 0.0–5.0)
Eosinophils Absolute: 0.2 10*3/uL (ref 0.0–0.7)
HEMATOCRIT: 34.3 % — AB (ref 36.0–46.0)
HEMOGLOBIN: 11.7 g/dL — AB (ref 12.0–15.0)
LYMPHS ABS: 3 10*3/uL (ref 0.7–4.0)
Lymphocytes Relative: 39.1 % (ref 12.0–46.0)
MCHC: 34 g/dL (ref 30.0–36.0)
MCV: 84.3 fl (ref 78.0–100.0)
Monocytes Absolute: 0.5 10*3/uL (ref 0.1–1.0)
Monocytes Relative: 6.2 % (ref 3.0–12.0)
Neutro Abs: 4 10*3/uL (ref 1.4–7.7)
Neutrophils Relative %: 51.6 % (ref 43.0–77.0)
Platelets: 371 10*3/uL (ref 150.0–400.0)
RBC: 4.07 Mil/uL (ref 3.87–5.11)
RDW: 15.1 % (ref 11.5–15.5)
WBC: 7.6 10*3/uL (ref 4.0–10.5)

## 2018-04-21 LAB — LIPASE: LIPASE: 14 U/L (ref 11.0–59.0)

## 2020-03-21 ENCOUNTER — Emergency Department (HOSPITAL_BASED_OUTPATIENT_CLINIC_OR_DEPARTMENT_OTHER): Payer: Managed Care, Other (non HMO)

## 2020-03-21 ENCOUNTER — Other Ambulatory Visit: Payer: Self-pay

## 2020-03-21 ENCOUNTER — Encounter (HOSPITAL_BASED_OUTPATIENT_CLINIC_OR_DEPARTMENT_OTHER): Payer: Self-pay | Admitting: *Deleted

## 2020-03-21 ENCOUNTER — Emergency Department (HOSPITAL_BASED_OUTPATIENT_CLINIC_OR_DEPARTMENT_OTHER)
Admission: EM | Admit: 2020-03-21 | Discharge: 2020-03-22 | Disposition: A | Discharge status: Home / Self Care | Payer: Managed Care, Other (non HMO) | Attending: Emergency Medicine | Admitting: Emergency Medicine

## 2020-03-21 DIAGNOSIS — Z20822 Contact with and (suspected) exposure to covid-19: Secondary | ICD-10-CM | POA: Insufficient documentation

## 2020-03-21 DIAGNOSIS — R112 Nausea with vomiting, unspecified: Secondary | ICD-10-CM | POA: Diagnosis not present

## 2020-03-21 DIAGNOSIS — R519 Headache, unspecified: Secondary | ICD-10-CM | POA: Insufficient documentation

## 2020-03-21 DIAGNOSIS — R42 Dizziness and giddiness: Secondary | ICD-10-CM | POA: Insufficient documentation

## 2020-03-21 DIAGNOSIS — R0602 Shortness of breath: Secondary | ICD-10-CM | POA: Insufficient documentation

## 2020-03-21 DIAGNOSIS — R197 Diarrhea, unspecified: Secondary | ICD-10-CM | POA: Diagnosis not present

## 2020-03-21 DIAGNOSIS — R109 Unspecified abdominal pain: Secondary | ICD-10-CM | POA: Insufficient documentation

## 2020-03-21 DIAGNOSIS — I10 Essential (primary) hypertension: Secondary | ICD-10-CM | POA: Diagnosis not present

## 2020-03-21 DIAGNOSIS — Z79899 Other long term (current) drug therapy: Secondary | ICD-10-CM | POA: Insufficient documentation

## 2020-03-21 DIAGNOSIS — R509 Fever, unspecified: Secondary | ICD-10-CM

## 2020-03-21 LAB — COMPREHENSIVE METABOLIC PANEL
ALT: 16 U/L (ref 0–44)
AST: 24 U/L (ref 15–41)
Albumin: 3.6 g/dL (ref 3.5–5.0)
Alkaline Phosphatase: 64 U/L (ref 38–126)
Anion gap: 15 (ref 5–15)
BUN: 16 mg/dL (ref 6–20)
CO2: 25 mmol/L (ref 22–32)
Calcium: 8.7 mg/dL — ABNORMAL LOW (ref 8.9–10.3)
Chloride: 94 mmol/L — ABNORMAL LOW (ref 98–111)
Creatinine, Ser: 0.96 mg/dL (ref 0.44–1.00)
GFR calc Af Amer: >60 mL/min (ref >60)
GFR calc non Af Amer: >60 mL/min (ref >60)
Glucose, Bld: 237 mg/dL — ABNORMAL HIGH (ref 70–99)
Potassium: 3.2 mmol/L — ABNORMAL LOW (ref 3.5–5.1)
Sodium: 134 mmol/L — ABNORMAL LOW (ref 135–145)
Total Bilirubin: 1.3 mg/dL — ABNORMAL HIGH (ref 0.3–1.2)
Total Protein: 7.7 g/dL (ref 6.5–8.1)

## 2020-03-21 LAB — URINALYSIS, ROUTINE W REFLEX MICROSCOPIC
Bilirubin Urine: NEGATIVE
Glucose, UA: NEGATIVE mg/dL
Hgb urine dipstick: NEGATIVE
Ketones, ur: NEGATIVE mg/dL
Leukocytes,Ua: NEGATIVE
Nitrite: NEGATIVE
Protein, ur: NEGATIVE mg/dL
Specific Gravity, Urine: 1.02 (ref 1.005–1.030)
pH: 6 (ref 5.0–8.0)

## 2020-03-21 LAB — SARS CORONAVIRUS 2 BY RT PCR (HOSPITAL ORDER, PERFORMED IN ~~LOC~~ HOSPITAL LAB): SARS Coronavirus 2: NEGATIVE

## 2020-03-21 LAB — LACTIC ACID, PLASMA
Lactic Acid, Venous: 3.9 mmol/L (ref 0.5–1.9)
Lactic Acid, Venous: 3.3 mmol/L (ref 0.5–1.9)

## 2020-03-21 LAB — CBC WITH DIFFERENTIAL/PLATELET
Abs Immature Granulocytes: 0.03 10*3/uL (ref 0.00–0.07)
Basophils Absolute: 0 10*3/uL (ref 0.0–0.1)
Basophils Relative: 0 %
Eosinophils Absolute: 0.1 10*3/uL (ref 0.0–0.5)
Eosinophils Relative: 1 %
HCT: 35.7 % — ABNORMAL LOW (ref 36.0–46.0)
Hemoglobin: 11.7 g/dL — ABNORMAL LOW (ref 12.0–15.0)
Immature Granulocytes: 0 %
Lymphocytes Relative: 7 %
Lymphs Abs: 0.6 10*3/uL — ABNORMAL LOW (ref 0.7–4.0)
MCH: 28.5 pg (ref 26.0–34.0)
MCHC: 32.8 g/dL (ref 30.0–36.0)
MCV: 87.1 fL (ref 80.0–100.0)
Monocytes Absolute: 0.4 10*3/uL (ref 0.1–1.0)
Monocytes Relative: 4 %
Neutro Abs: 7.8 10*3/uL — ABNORMAL HIGH (ref 1.7–7.7)
Neutrophils Relative %: 88 %
Platelets: 369 10*3/uL (ref 150–400)
RBC: 4.1 MIL/uL (ref 3.87–5.11)
RDW: 14.3 % (ref 11.5–15.5)
WBC: 8.9 10*3/uL (ref 4.0–10.5)
nRBC: 0 % (ref 0.0–0.2)

## 2020-03-21 LAB — PROTIME-INR
INR: 1.1 (ref 0.8–1.2)
Prothrombin Time: 14.2 seconds (ref 11.4–15.2)

## 2020-03-21 LAB — APTT: aPTT: 28 seconds (ref 24–36)

## 2020-03-21 LAB — PREGNANCY, URINE: Preg Test, Ur: NEGATIVE

## 2020-03-21 MED ORDER — POTASSIUM CHLORIDE CRYS ER 20 MEQ PO TBCR
40.0000 meq | EXTENDED_RELEASE_TABLET | Freq: Once | ORAL | Status: AC
  Administered 2020-03-22: 40 meq via ORAL
  Filled 2020-03-21: qty 2

## 2020-03-21 MED ORDER — SODIUM CHLORIDE 0.9% FLUSH
3.0000 mL | Freq: Once | INTRAVENOUS | Status: DC
  Filled 2020-03-21: qty 3

## 2020-03-21 MED ORDER — ACETAMINOPHEN ER 650 MG PO TBCR
650.0000 mg | EXTENDED_RELEASE_TABLET | Freq: Three times a day (TID) | ORAL | 0 refills | Status: DC | PRN
Start: 2020-03-21 — End: 2021-02-05

## 2020-03-21 MED ORDER — ONDANSETRON 4 MG PO TBDP
4.0000 mg | ORAL_TABLET | Freq: Three times a day (TID) | ORAL | 0 refills | Status: DC | PRN
Start: 2020-03-21 — End: 2021-02-05

## 2020-03-21 MED ORDER — ACETAMINOPHEN 325 MG PO TABS
650.0000 mg | ORAL_TABLET | Freq: Once | ORAL | Status: AC
  Administered 2020-03-21: 650 mg via ORAL
  Filled 2020-03-21: qty 2

## 2020-03-21 MED ORDER — SODIUM CHLORIDE 0.9 % IV BOLUS
1000.0000 mL | Freq: Once | INTRAVENOUS | Status: AC
  Administered 2020-03-21: 1000 mL via INTRAVENOUS

## 2020-03-21 NOTE — Discharge Instructions (Signed)
As discussed, all of your labs are reassuring today.  I have no source for a bacterial infection.  I suspect your symptoms are related to Covid infection or another viral infection.  Continue to drink water.  I am sending you home with nausea medication.  Take as prescribed and as needed.  If your blood cultures are positive, you will be called back to the ER.  Return to the ER for worsening symptoms.

## 2020-03-21 NOTE — ED Provider Notes (Signed)
[facility] Provider Note   CSN: [id number] Arrival date & time: 03/21/20  2008     History Chief Complaint  Patient presents with  . Covid Symptoms    Becky Vazquez is a 49 y.o. female with a past medical history significant for hypertension who presents to the ED due to fever, chills, body aches, headache, and generalized abdominal pain.  Patient admits to 7 episodes of nonbloody, nonbilious emesis today.  She also admits to 4 episodes of nonbloody diarrhea.  Patient notes all of her symptoms started today.  Denies sick contacts and known Covid exposures.  She also admits to intermittent episodes of shortness of breath with exertion. Denies associated chest pain and lower extremity edema.  Denies history of blood clots, recent surgeries, recent long immobilizations, and hormonal treatments.  Denies history of asthma.  Denies urinary and vaginal symptoms.  She notes her abdominal pain and headache started after numerous episodes of vomiting and believes her pain is attributed to straining. Denies photophobia and neck pain. No treatment prior to arrival.  No aggravating or alleviating factors.  History obtained from patient and past medical records. No interpreter used during encounter.      Past Medical History:  Diagnosis Date  . Hypertension     Patient Active Problem List   Diagnosis Date Noted  . Pancreatitis due to biliary obstruction 11/20/2017  . LFTs abnormal 11/20/2017  . Hyperglycemia 11/20/2017  . ARF (acute renal failure) ([place]) 11/20/2017  . Acute pancreatitis 11/20/2017  . Allergic rhinitis 12/02/2014  . Vaginal discharge 05/11/2013  . Dysuria 05/11/2013  . Abdominal pain, acute 06/14/2012  . Enteritis 06/14/2012  . Dehydration 06/14/2012  . Nausea & vomiting 06/14/2012  . Constipation 06/14/2012  . FATIGUE 03/03/2010  . VAGINITIS 10/16/2009  . IMPAIRED GLUCOSE TOLERANCE 01/09/2008  . ANEMIA, IRON DEFICIENCY 01/09/2008  .  LEIOMYOMA, UTERUS 02/07/2007  . HYPERTENSION, BENIGN 12/28/2006  . OBESITY, NOS 11/24/2006    Past Surgical History:  Procedure Laterality Date  . CHOLECYSTECTOMY    . EUS N/A 12/15/2017   Procedure: UPPER ENDOSCOPIC ULTRASOUND (EUS) LINEAR;  Surgeon: [name], MD;  Location: [facility] ENDOSCOPY;  Service: Endoscopy;  Laterality: N/A;  . TUBAL LIGATION       OB History   No obstetric history on file.     Family History  Problem Relation Age of Onset  . Stomach cancer Mother   . Cancer Mother   . Prostate cancer Father   . Cancer Father   . Breast cancer Maternal Aunt   . Breast cancer Paternal Aunt     Social History   Tobacco Use  . Smoking status: Never Smoker  . Smokeless tobacco: Never Used  Vaping Use  . Vaping Use: Never used  Substance Use Topics  . Alcohol use: No  . Drug use: No    Home Medications Prior to Admission medications   Medication Sig Start Date End Date Taking? Authorizing Provider  atenolol (TENORMIN) 25 MG tablet Take 25 mg by mouth daily.   Yes [provider]  BIOTIN PO Take 1 tablet by mouth daily.   Yes [provider]  cyanocobalamin 2000 MCG tablet Take 2,000 mcg by mouth daily.   Yes [provider]  fluticasone (FLONASE) 50 MCG/ACT nasal spray Place 2 sprays into both nostrils daily.    Yes [provider]  lisinopril (PRINIVIL,ZESTRIL) 20 MG tablet Take 1 tablet by mouth daily. 10/21/14  Yes [provider]  Multiple  Vitamins-Minerals (WOMENS DAILY FORMULA PO) Take 1 tablet by mouth daily.   Yes [provider]  omeprazole (PRILOSEC) 20 MG capsule Take 1 capsule (20 mg total) by mouth daily. 11/18/17  Yes Ripley Fraise, MD  phentermine 37.5 MG capsule Take 37.5 mg by mouth every morning.   Yes [provider]  acetaminophen (TYLENOL 8 HOUR) 650 MG CR tablet Take 1 tablet (650 mg total) by mouth every 8 (eight) hours as needed for pain. 03/21/20   Suzy Bouchard, PA-C    JINTELI 1-5 MG-MCG TABS tablet Take 1 tablet by mouth daily. 11/05/17   [provider]  liothyronine (CYTOMEL) 25 MCG tablet Take 25 mcg by mouth daily. 11/01/17   [provider]  ondansetron (ZOFRAN ODT) 4 MG disintegrating tablet Take 1 tablet (4 mg total) by mouth every 8 (eight) hours as needed for nausea or vomiting. 03/21/20   Suzy Bouchard, PA-C  traMADol (ULTRAM) 50 MG tablet Take 50 mg by mouth daily as needed for pain. 11/18/17   [provider]    Allergies    Promethazine-dm  Review of Systems   Review of Systems  Constitutional: Positive for chills and fever.  HENT: Negative for sore throat.   Respiratory: Positive for shortness of breath. Negative for cough.   Gastrointestinal: Positive for abdominal pain, diarrhea, nausea and vomiting.  Genitourinary: Negative for dysuria and vaginal discharge.  Neurological: Positive for dizziness and headaches.  All other systems reviewed and are negative.   Physical Exam Updated Vital Signs BP 138/81   Pulse (!) 105   Temp (!) 100.5 F (38.1 C) (Oral)   Resp 18   Ht 5\' 4"  (1.626 m)   Wt 112.5 kg   LMP 02/28/2014   SpO2 100%   BMI 42.57 kg/m   Physical Exam Vitals and nursing note reviewed.  Constitutional:      General: She is not in acute distress.    Appearance: She is not toxic-appearing.  HENT:     Head: Normocephalic.  Eyes:     Pupils: Pupils are equal, round, and reactive to light.  Neck:     Comments: No meningismus. Cardiovascular:     Rate and Rhythm: Normal rate and regular rhythm.     Pulses: Normal pulses.     Heart sounds: Normal heart sounds. No murmur heard.  No friction rub. No gallop.   Pulmonary:     Effort: Pulmonary effort is normal.     Breath sounds: Normal breath sounds.  Abdominal:     General: Abdomen is flat. Bowel sounds are normal. There is no distension.     Palpations: Abdomen is soft.     Tenderness: There is abdominal tenderness. There is no right  CVA tenderness, left CVA tenderness or guarding.     Comments: Generalized abdominal tenderness worse on the right side.  No rebound or guarding.  Musculoskeletal:     Cervical back: Neck supple.     Comments: No lower extremity edema.  Skin:    General: Skin is warm and dry.  Neurological:     General: No focal deficit present.     Mental Status: She is alert.  Psychiatric:        Mood and Affect: Mood normal.        Behavior: Behavior normal.     ED Results / Procedures / Treatments   Labs (all labs ordered are listed, but only abnormal results are displayed) Labs Reviewed  LACTIC ACID, PLASMA -  Abnormal; Notable for the following components:      Result Value   Lactic Acid, Venous 3.9 (*)    All other components within normal limits  LACTIC ACID, PLASMA - Abnormal; Notable for the following components:   Lactic Acid, Venous 3.3 (*)    All other components within normal limits  COMPREHENSIVE METABOLIC PANEL - Abnormal; Notable for the following components:   Sodium 134 (*)    Potassium 3.2 (*)    Chloride 94 (*)    Glucose, Bld 237 (*)    Calcium 8.7 (*)    Total Bilirubin 1.3 (*)    All other components within normal limits  CBC WITH DIFFERENTIAL/PLATELET - Abnormal; Notable for the following components:   Hemoglobin 11.7 (*)    HCT 35.7 (*)    Neutro Abs 7.8 (*)    Lymphs Abs 0.6 (*)    All other components within normal limits  SARS CORONAVIRUS 2 BY RT PCR (HOSPITAL ORDER, [facility])  CULTURE, BLOOD (ROUTINE X 2)  CULTURE, BLOOD (ROUTINE X 2)  URINE CULTURE  URINALYSIS, ROUTINE W REFLEX MICROSCOPIC  PREGNANCY, URINE  APTT  PROTIME-INR    EKG EKG Interpretation  Date/Time:  Friday March 21 2020 21:28:53 EDT Ventricular Rate:  118 PR Interval:    QRS Duration: 72 QT Interval:  334 QTC Calculation: 468 R Axis:   66 Text Interpretation: Sinus tachycardia Nonspecific T abnormalities, lateral leads WHen cmpared to prior, no  significant changes seen. NO STEMI Confirmed by Antony Blackbird [phone]) on 03/21/2020 10:08:21 PM   Radiology DG Chest Port 1 View  Result Date: 03/21/2020 CLINICAL DATA:  Fever chills headache EXAM: PORTABLE CHEST 1 VIEW COMPARISON:  11/21/2013 FINDINGS: The heart size and mediastinal contours are within normal limits. Both lungs are clear. The visualized skeletal structures are unremarkable. IMPRESSION: No active disease. Electronically Signed   By: Donavan Foil M.D.   On: 03/21/2020 21:54    Procedures Procedures (including critical care time)  Medications Ordered in ED Medications  sodium chloride flush (NS) 0.9 % injection 3 mL (3 mLs Intravenous Not Given 03/21/20 2152)  potassium chloride SA (KLOR-CON) CR tablet 40 mEq (has no administration in time range)  acetaminophen (TYLENOL) tablet 650 mg (650 mg Oral Given 03/21/20 2025)  sodium chloride 0.9 % bolus 1,000 mL (1,000 mLs Intravenous New Bag/Given 03/21/20 2214)    ED Course  I have reviewed the triage vital signs and the nursing notes.  Pertinent labs & imaging results that were available during my care of the patient were reviewed by me and considered in my medical decision making (see chart for details).  Clinical Course as of Mar 21 2334  Fri Mar 21, 2020  2052 Temp(!): 102.5 F (39.2 C) [CA]  2052 Pulse Rate(!): 125 [CA]  2113 Code sepsis initiated after initiate evaluation. Unsure if patient's symptoms related to COVID vs. Bacterial infection.    [CA]  2113 Lactic Acid, Venous(!!): 3.9 [CA]  2251 Lactic Acid, Venous(!!): 3.3 [CA]    Clinical Course User Index [CA] Suzy Bouchard, PA-C   MDM Rules/Calculators/A&P                         49 year old female presents to the ED due to fever, chills, body aches, headache, abdominal pain, nausea, vomiting that started earlier today.  Denies sick contacts and known Covid exposures.  She has not received her Covid vaccine yet.  Denies urinary and  vaginal symptoms.   Denies cough and sore throat.  On arrival, patient febrile at 102.5 F and tachycardic at 125, but otherwise normal vitals.  Patient in no acute distress and nontoxic-appearing.  Physical exam reassuring.  Mild generalized abdominal tenderness.  No rebound or guarding.  Low suspicion for acute abdomen at this time.  Negative CVA tenderness.  Doubt pyelonephritis.  Code sepsis initiated after initial evaluation.  We will hold on antibiotics until possible source given it could possibly be Covid vs. Another viral etiology. Will start antibiotics if source found. Discussed case with Dr. Sherry Ruffing who agrees with assessment and plan.   CBC reassuring with no leukocytosis and mild anemia with hemoglobin 11.7.  Lactic acid elevated at 3.9.  pregnacy test negative.  CMP significant for mild hyponatremia 134, hypokalemia at 3.2, hyper glycemia at 237 with no anion gap.  Doubt DKA.  UA negative for signs of infection.  Chest x-ray personally reviewed which is negative for signs of pneumonia, pneumothorax, or widened mediastinum.  EKG personally reviewed which demonstrates sinus tachycardia with no signs of acute ischemia. So far, no bacterial source identified. Presentation not consistent with meningitis. No meningismus on exam.   10:47 PM shared decision making in regards to antibiotic treatment for unknown source and hospital admission vs. symptomatic treatment for possible viral infection. Patient would prefer symptomatic treatment for possibly viral etiology which I find to be reasonable given there is no identifiable source at this time and patient is non-toxic appearing. Upon reassessment, abdomen soft, non-distended, and non-tender. No concern for intra-abdominal infection.  Will recheck lactic acid.  If lactic acid is improving patient may be discharged home.  Patient is aware that if her blood cultures are positive she will be called back to return to the ED for further treatment.   Lactic acid improved to 3.3.  Suspect elevated lactic acid due to dehydration. Patient looks clinically well on exam. Patient would like to go home at this time. Will discharge patient with zofran and Tylenol. Advised patient to return to the ER for worsening symptoms, uncontrollable fever, intractable vomiting or worsening symptoms. Strict ED precautions discussed with patient. Patient states understanding and agrees to plan. Patient discharged home in no acute distress and stable vitals   Eladia Frame was evaluated in Emergency Department on 03/21/2020 for the symptoms described in the history of present illness. She was evaluated in the context of the global COVID-19 pandemic, which necessitated consideration that the patient might be at risk for infection with the SARS-CoV-2 virus that causes COVID-19. Institutional protocols and algorithms that pertain to the evaluation of patients at risk for COVID-19 are in a state of rapid change based on information released by regulatory bodies including the CDC and federal and state organizations. These policies and algorithms were followed during the patient's care in the ED.  Final Clinical Impression(s) / ED Diagnoses Final diagnoses:  Suspected COVID-19 virus infection  Nausea vomiting and diarrhea  Fever, unspecified fever cause    Rx / DC Orders ED Discharge Orders         Ordered    ondansetron (ZOFRAN ODT) 4 MG disintegrating tablet  Every 8 hours PRN     Discontinue  Reprint     03/21/20 2329    acetaminophen (TYLENOL 8 HOUR) 650 MG CR tablet  Every 8 hours PRN     Discontinue  Reprint     03/21/20 2331           Suzy Bouchard, PA-C  03/21/20 2336    Tegeler, Gwenyth Allegra, MD 03/22/20 0040

## 2020-03-21 NOTE — ED Notes (Signed)
Pt. Reports she has felt weak and tired since it all began and she had a hamburger last PM from a restaurant

## 2020-03-21 NOTE — ED Notes (Signed)
Patient denies pain and is resting comfortably.  

## 2020-03-21 NOTE — ED Triage Notes (Signed)
Fever, chills, body aches, headache, abdominal pain, diarrhea, vomiting, sob since this am.

## 2020-03-21 NOTE — ED Notes (Signed)
Date and time results received: 03/21/20 2107  Test: lactic acid Critical Value: 3.9  Name of Provider Notified: Dr. Sherry Ruffing  Orders Received? Or Actions Taken?: awaiting new orders

## 2020-03-23 LAB — BLOOD CULTURE ID PANEL (REFLEXED)

## 2020-03-23 LAB — URINE CULTURE: Culture: 2000 — AB

## 2020-03-24 ENCOUNTER — Telehealth: Payer: Self-pay

## 2020-03-24 NOTE — Telephone Encounter (Signed)
Post ED Visit - Positive Culture Follow-up  Culture report reviewed by antimicrobial stewardship pharmacist: [facility] Team []  Elenor Quinones, Pharm.D. []  Heide Guile, Pharm.D., BCPS AQ-ID []  Parks Neptune, Pharm.D., BCPS []  Alycia Rossetti, Pharm.D., BCPS []  [place], Pharm.D., BCPS, AAHIVP []  Legrand Como, Pharm.D., BCPS, AAHIVP []  Salome Arnt, PharmD, BCPS []  Johnnette Gourd, PharmD, BCPS []  Hughes Better, PharmD, BCPS []  Leeroy Cha, PharmD []  Laqueta Linden, PharmD, BCPS []  Albertina Parr, [place] Team []  Leodis Sias, PharmD []  Lindell Spar, PharmD []  Royetta Asal, PharmD []  Graylin Shiver, Rph []  Rema Fendt) Glennon Mac, PharmD []  Arlyn Dunning, PharmD []  Netta Cedars, PharmD []  Dia Sitter, PharmD []  Leone Haven, PharmD []  Gretta Arab, PharmD []  Theodis Shove, PharmD []  Peggyann Juba, PharmD []  Reuel Boom, PharmD   Positive urine culture  and no further patient follow-up is required at this time.  Genia Del 03/24/2020, 12:21 PM

## 2020-03-25 LAB — CULTURE, BLOOD (ROUTINE X 2): Special Requests: ADEQUATE

## 2020-03-26 NOTE — Progress Notes (Signed)
ED Antimicrobial Stewardship Positive Culture Follow Up   Becky Vazquez is an 49 y.o. female who presented to [facility] on 03/21/2020 with a chief complaint of  Chief Complaint  Patient presents with  . Covid Symptoms    Recent Results (from the past 720 hour(s))  Urine culture     Status: Abnormal   Collection Time: 03/21/20  8:37 PM   Specimen: In/Out Cath Urine  Result Value Ref Range Status   Specimen Description   Final    IN/OUT CATH URINE Performed at [facility], [address] [place], [place] [address]    Special Requests   Final    NONE Performed at [facility], [address] [place], [place] [address]    Culture (A)  Final    2,000 COLONIES/mL GROUP B STREP(S.AGALACTIAE)ISOLATED TESTING AGAINST S. AGALACTIAE NOT ROUTINELY PERFORMED DUE TO PREDICTABILITY OF AMP/PEN/VAN SUSCEPTIBILITY. Performed at [facility], [place] [address], [place], [place] [address]    Report Status 03/23/2020 FINAL  Final  Blood Culture (routine x 2)     Status: None (Preliminary result)   Collection Time: 03/21/20  9:50 PM   Specimen: BLOOD LEFT ARM  Result Value Ref Range Status   Specimen Description   Final    BLOOD LEFT ARM Performed at [facility], [address] [place], [place] [address]    Special Requests   Final    BOTTLES DRAWN AEROBIC AND ANAEROBIC Blood Culture adequate volume Performed at [facility], [address] [place], [place] [address]    Culture   Final    NO GROWTH 4 DAYS Performed at [facility], [place] [address] [place], [place] [address]    Report Status PENDING  Incomplete  SARS Coronavirus 2 by RT PCR (hospital order, performed in [facility] hospital lab) Nasopharyngeal Nasopharyngeal Swab     Status: None   Collection Time: 03/21/20  9:51 PM   Specimen: Nasopharyngeal Swab  Result Value Ref Range Status   SARS Coronavirus 2 NEGATIVE NEGATIVE Final    Comment: (NOTE) SARS-CoV-2 target  nucleic acids are NOT DETECTED.  The SARS-CoV-2 RNA is generally detectable in upper and lower respiratory specimens during the acute phase of infection. The lowest concentration of SARS-CoV-2 viral copies this assay can detect is 250 copies / mL. A negative result does not preclude SARS-CoV-2 infection and should not be used as the sole basis for treatment or other patient management decisions.  A negative result may occur with improper specimen collection / handling, submission of specimen other than nasopharyngeal swab, presence of viral mutation(s) within the areas targeted by this assay, and inadequate number of viral copies (<250 copies / mL). A negative result must be combined with clinical observations, patient history, and epidemiological information.  Fact Sheet for Patients:   StrictlyIdeas.no  Fact Sheet for Healthcare Providers: BankingDealers.co.za  This test is not yet approved or  cleared by the [place] FDA and has been authorized for detection and/or diagnosis of SARS-CoV-2 by FDA under an Emergency Use Authorization (EUA).  This EUA will remain in effect (meaning this test can be used) for the duration of the COVID-19 declaration under Section 564(b)(1) of the Act, 21 U.S.C. section 360bbb-3(b)(1), unless the authorization is terminated or revoked sooner.  Performed at [facility], [address] [place], [place] [address]   Blood Culture (routine x 2)  Status: Abnormal   Collection Time: 03/21/20 10:05 PM   Specimen: BLOOD RIGHT ARM  Result Value Ref Range Status   Specimen Description   Final    BLOOD RIGHT ARM Performed at [facility], [address] [place], [place] [address]    Special Requests   Final    BOTTLES DRAWN AEROBIC AND ANAEROBIC Blood Culture adequate volume Performed at [facility], [address] [place], [place] [address]    Culture  Setup Time    Final    GRAM POSITIVE COCCI AEROBIC BOTTLE ONLY CRITICAL RESULT CALLED TO, READ BACK BY AND VERIFIED WITH: CRN K CRANSTON @0633  03/23/20 BY S GEZAHEGN    Culture (A)  Final    STAPHYLOCOCCUS SPECIES (COAGULASE NEGATIVE) THE SIGNIFICANCE OF ISOLATING THIS ORGANISM FROM A SINGLE SET OF BLOOD CULTURES WHEN MULTIPLE SETS ARE DRAWN IS UNCERTAIN. PLEASE NOTIFY THE MICROBIOLOGY DEPARTMENT WITHIN ONE WEEK IF SPECIATION AND SENSITIVITIES ARE REQUIRED. Performed at [facility], [place] [address] [place], [place] [address]    Report Status 03/25/2020 FINAL  Final  Blood Culture ID Panel (Reflexed)     Status: Abnormal   Collection Time: 03/21/20 10:05 PM  Result Value Ref Range Status   Enterococcus species NOT DETECTED NOT DETECTED Final   Listeria monocytogenes NOT DETECTED NOT DETECTED Final   Staphylococcus species DETECTED (A) NOT DETECTED Final    Comment: Methicillin (oxacillin) susceptible coagulase negative staphylococcus. Possible blood culture contaminant (unless isolated from more than one blood culture draw or clinical case suggests pathogenicity). No antibiotic treatment is indicated for blood  culture contaminants. CRITICAL RESULT CALLED TO, READ BACK BY AND VERIFIED WITH: CRN K CRANSTON @0633  03/23/20 BY S GEZAHEGN    Staphylococcus aureus (BCID) NOT DETECTED NOT DETECTED Final   Methicillin resistance NOT DETECTED NOT DETECTED Final   Streptococcus species NOT DETECTED NOT DETECTED Final   Streptococcus agalactiae NOT DETECTED NOT DETECTED Final   Streptococcus pneumoniae NOT DETECTED NOT DETECTED Final   Streptococcus pyogenes NOT DETECTED NOT DETECTED Final   Acinetobacter baumannii NOT DETECTED NOT DETECTED Final   Enterobacteriaceae species NOT DETECTED NOT DETECTED Final   Enterobacter cloacae complex NOT DETECTED NOT DETECTED Final   Escherichia coli NOT DETECTED NOT DETECTED Final   Klebsiella oxytoca NOT DETECTED NOT DETECTED Final   Klebsiella pneumoniae NOT  DETECTED NOT DETECTED Final   Proteus species NOT DETECTED NOT DETECTED Final   Serratia marcescens NOT DETECTED NOT DETECTED Final   Haemophilus influenzae NOT DETECTED NOT DETECTED Final   Neisseria meningitidis NOT DETECTED NOT DETECTED Final   Pseudomonas aeruginosa NOT DETECTED NOT DETECTED Final   Candida albicans NOT DETECTED NOT DETECTED Final   Candida glabrata NOT DETECTED NOT DETECTED Final   Candida krusei NOT DETECTED NOT DETECTED Final   Candida parapsilosis NOT DETECTED NOT DETECTED Final   Candida tropicalis NOT DETECTED NOT DETECTED Final    Comment: Performed at [facility], [place]. [address] [place], [place] [address]    [x]  Patient discharged originally without antimicrobial agent  New antibiotic prescription: No further treatment indicated, as this blood culture likely represents contaminant in only 1 of 4 bottles.  ED Provider: Alfredia Client, PA-C   Margretta Sidle Dohlen 03/26/2020, 12:15 PM Clinical Pharmacist Monday - Friday phone -  [phone] Saturday - Sunday phone - [phone]

## 2020-03-27 LAB — CULTURE, BLOOD (ROUTINE X 2)
Culture: NO GROWTH
Special Requests: ADEQUATE

## 2020-10-22 ENCOUNTER — Encounter: Payer: Self-pay | Admitting: Internal Medicine

## 2020-12-03 ENCOUNTER — Ambulatory Visit: Payer: Managed Care, Other (non HMO)

## 2020-12-03 ENCOUNTER — Telehealth: Payer: Self-pay

## 2020-12-03 NOTE — Telephone Encounter (Signed)
Attempted twice to reach pt for virtual PV.  Went to VM both attempts.  LM for pt to call back by EOB today to r/s PV and prevent cancellation of  procedure

## 2020-12-03 NOTE — Progress Notes (Signed)
NS for virtual PV even after 2 attempts

## 2020-12-04 NOTE — Telephone Encounter (Signed)
No return call to r/s PV.  No show letter mailed and procedure cancelled until pt calls to r/s PV

## 2020-12-19 ENCOUNTER — Encounter: Payer: Managed Care, Other (non HMO) | Admitting: Internal Medicine

## 2021-02-05 ENCOUNTER — Other Ambulatory Visit: Payer: Self-pay

## 2021-02-05 ENCOUNTER — Ambulatory Visit (AMBULATORY_SURGERY_CENTER): Payer: Managed Care, Other (non HMO) | Admitting: *Deleted

## 2021-02-05 VITALS — Ht 63.0 in | Wt 222.0 lb

## 2021-02-05 DIAGNOSIS — Z1211 Encounter for screening for malignant neoplasm of colon: Secondary | ICD-10-CM

## 2021-02-05 MED ORDER — PEG 3350-KCL-NA BICARB-NACL 420 G PO SOLR: 4000.0000 mL | Freq: Once | ORAL | 0 refills | Status: AC

## 2021-02-05 NOTE — Progress Notes (Signed)
Patient's pre-visit was done today over the phone with the patient due to COVID-19 pandemic. Name,DOB and address verified. Insurance verified. Patient denies any allergies to Eggs and Soy. Patient denies any problems with anesthesia/sedation. Patient denies taking  blood thinners. Pt aware to stop phentermine x 10 days before exam. Packet of Prep instructions mailed to patient including a copy of a consent form-pt is aware. Patient understands to call us back with any questions or concerns. Patient is aware of our care-partner policy and [id number] safety protocol. EMMI education assigned to the patient for the procedure, sent to [facility]. The patient is COVID-19 vaccinated, per patient.

## 2021-02-16 ENCOUNTER — Encounter: Payer: Self-pay | Admitting: Internal Medicine

## 2021-02-20 ENCOUNTER — Encounter: Payer: Self-pay | Admitting: Internal Medicine

## 2021-02-20 ENCOUNTER — Ambulatory Visit (AMBULATORY_SURGERY_CENTER): Payer: Managed Care, Other (non HMO) | Admitting: Internal Medicine

## 2021-02-20 ENCOUNTER — Other Ambulatory Visit: Payer: Self-pay

## 2021-02-20 VITALS — BP 122/76 | HR 65 | Temp 98.0°F | Resp 13 | Ht 63.0 in | Wt 222.0 lb

## 2021-02-20 DIAGNOSIS — Z1211 Encounter for screening for malignant neoplasm of colon: Secondary | ICD-10-CM | POA: Diagnosis present

## 2021-02-20 DIAGNOSIS — A630 Anogenital (venereal) warts: Secondary | ICD-10-CM | POA: Diagnosis not present

## 2021-02-20 DIAGNOSIS — D129 Benign neoplasm of anus and anal canal: Secondary | ICD-10-CM

## 2021-02-20 MED ORDER — SODIUM CHLORIDE 0.9 % IV SOLN: 500.0000 mL | Freq: Once | INTRAVENOUS | Status: DC

## 2021-02-20 NOTE — Op Note (Signed)
[facility] Patient Name: Becky Vazquez Procedure Date: 02/20/2021 2:31 PM MRN: [id number] Endoscopist: Jerene Bears , MD Age: 50 Referring MD:  Date of Birth: 07-09-1971 Gender: Female Account #: [id number] Procedure:                Colonoscopy Indications:              Screening for colorectal malignant neoplasm, This                            is the patient's first colonoscopy Medicines:                Monitored Anesthesia Care Procedure:                Pre-Anesthesia Assessment:                           - Prior to the procedure, a History and Physical                            was performed, and patient medications and                            allergies were reviewed. The patient's tolerance of                            previous anesthesia was also reviewed. The risks                            and benefits of the procedure and the sedation                            options and risks were discussed with the patient.                            All questions were answered, and informed consent                            was obtained. Prior Anticoagulants: The patient has                            taken no previous anticoagulant or antiplatelet                            agents. ASA Grade Assessment: II - A patient with                            mild systemic disease. After reviewing the risks                            and benefits, the patient was deemed in                            satisfactory condition to undergo the procedure.  After obtaining informed consent, the colonoscope                            was passed under direct vision. Throughout the                            procedure, the patient's blood pressure, pulse, and                            oxygen saturations were monitored continuously. The                            Olympus PCF-H190DL ([id number]) Colonoscope was                            introduced through the anus  and advanced to the                            cecum, identified by appendiceal orifice and                            ileocecal valve. The colonoscopy was performed                            without difficulty. The patient tolerated the                            procedure well. The quality of the bowel                            preparation was good. The ileocecal valve,                            appendiceal orifice, and rectum were photographed. Scope In: 2:41:38 PM Scope Out: 2:56:58 PM Scope Withdrawal Time: 0 hours 12 minutes 39 seconds  Total Procedure Duration: 0 hours 15 minutes 20 seconds  Findings:                 The digital rectal exam was normal.                           Three sessile polyps were found in the anal canal.                            The polyps were 3 to 4 mm in size. These were                            biopsied/sampled with a cold forceps for histology                            to exclude AIN.                           The exam was otherwise without abnormality on  direct and retroflexion views. Complications:            No immediate complications. Estimated Blood Loss:     Estimated blood loss was minimal. Impression:               - Three 3 to 4 mm polyps at the anus. Biopsied.                           - The examination was otherwise normal on direct                            and retroflexion views. Recommendation:           - Patient has a contact number available for                            emergencies. The signs and symptoms of potential                            delayed complications were discussed with the                            patient. Return to normal activities tomorrow.                            Written discharge instructions were provided to the                            patient.                           - Resume previous diet.                           - Continue present medications.                            - Await pathology results.                           - Repeat colonoscopy is recommended. The                            colonoscopy date will be determined after pathology                            results from today's exam become available for                            review. Jerene Bears, MD 02/20/2021 2:59:56 PM This report has been signed electronically.

## 2021-02-20 NOTE — Progress Notes (Signed)
VS by CW  Pt's states no medical or surgical changes since previsit or office visit.  

## 2021-02-20 NOTE — Patient Instructions (Signed)
Information on polyps given to you today.  Await pathology results.  Resume previous diet and medications.  YOU HAD AN ENDOSCOPIC PROCEDURE TODAY AT THE [facility]:   Refer to the procedure report that was given to you for any specific questions about what was found during the examination.  If the procedure report does not answer your questions, please call your gastroenterologist to clarify.  If you requested that your care partner not be given the details of your procedure findings, then the procedure report has been included in a sealed envelope for you to review at your convenience later.  YOU SHOULD EXPECT: Some feelings of bloating in the abdomen. Passage of more gas than usual.  Walking can help get rid of the air that was put into your GI tract during the procedure and reduce the bloating. If you had a lower endoscopy (such as a colonoscopy or flexible sigmoidoscopy) you may notice spotting of blood in your stool or on the toilet paper. If you underwent a bowel prep for your procedure, you may not have a normal bowel movement for a few days.  Please Note:  You might notice some irritation and congestion in your nose or some drainage.  This is from the oxygen used during your procedure.  There is no need for concern and it should clear up in a day or so.  SYMPTOMS TO REPORT IMMEDIATELY:   Following lower endoscopy (colonoscopy or flexible sigmoidoscopy):  Excessive amounts of blood in the stool  Significant tenderness or worsening of abdominal pains  Swelling of the abdomen that is new, acute  Fever of 100F or higher   For urgent or emergent issues, a gastroenterologist can be reached at any hour by calling [phone]. Do not use MyChart messaging for urgent concerns.    DIET:  We do recommend a small meal at first, but then you may proceed to your regular diet.  Drink plenty of fluids but you should avoid alcoholic beverages for 24 hours.  ACTIVITY:  You should  plan to take it easy for the rest of today and you should NOT DRIVE or use heavy machinery until tomorrow (because of the sedation medicines used during the test).    FOLLOW UP: Our staff will call the number listed on your records 48-72 hours following your procedure to check on you and address any questions or concerns that you may have regarding the information given to you following your procedure. If we do not reach you, we will leave a message.  We will attempt to reach you two times.  During this call, we will ask if you have developed any symptoms of COVID 19. If you develop any symptoms (ie: fever, flu-like symptoms, shortness of breath, cough etc.) before then, please call [phone].  If you test positive for Covid 19 in the 2 weeks post procedure, please call and report this information to us.    If any biopsies were taken you will be contacted by phone or by letter within the next 1-3 weeks.  Please call us at [phone] if you have not heard about the biopsies in 3 weeks.    SIGNATURES/CONFIDENTIALITY: You and/or your care partner have signed paperwork which will be entered into your electronic medical record.  These signatures attest to the fact that that the information above on your After Visit Summary has been reviewed and is understood.  Full responsibility of the confidentiality of this discharge information lies with you and/or your care-partner. 

## 2021-02-20 NOTE — Progress Notes (Signed)
Called to room to assist during endoscopic procedure.  Patient ID and intended procedure confirmed with present staff. Received instructions for my participation in the procedure from the performing physician.  

## 2021-02-20 NOTE — Progress Notes (Signed)
PT taken to PACU. Monitors in place. VSS. Report given to RN. 

## 2021-02-24 ENCOUNTER — Telehealth: Payer: Self-pay | Admitting: *Deleted

## 2021-02-24 NOTE — Telephone Encounter (Signed)
  Follow up Call-  Call back number 02/20/2021  Post procedure Call Back phone  # [phone]  Permission to leave phone message Yes  Some recent data might be hidden     Patient questions:  Do you have a fever, pain , or abdominal swelling? No. Pain Score  0 *  Have you tolerated food without any problems? Yes.    Have you been able to return to your normal activities? Yes.    Do you have any questions about your discharge instructions: Diet   No. Medications  No. Follow up visit  No.  Do you have questions or concerns about your Care? No.  Actions: * If pain score is 4 or above: No action needed, pain <4.  1. Have you developed a fever since your procedure? no  2.   Have you had an respiratory symptoms (SOB or cough) since your procedure? no  3.   Have you tested positive for COVID 19 since your procedure no  4.   Have you had any family members/close contacts diagnosed with the COVID 19 since your procedure?  no   If yes to any of these questions please route to Joylene John, RN and Joella Prince, RN

## 2021-02-24 NOTE — Telephone Encounter (Signed)
  Follow up Call-  Call back number 02/20/2021  Post procedure Call Back phone  # [phone]  Permission to leave phone message Yes  Some recent data might be hidden    [facility]

## 2021-09-17 ENCOUNTER — Other Ambulatory Visit: Payer: Self-pay | Admitting: Obstetrics and Gynecology

## 2021-09-17 DIAGNOSIS — Z1231 Encounter for screening mammogram for malignant neoplasm of breast: Secondary | ICD-10-CM

## 2021-10-05 ENCOUNTER — Encounter (HOSPITAL_BASED_OUTPATIENT_CLINIC_OR_DEPARTMENT_OTHER): Payer: Self-pay

## 2021-10-05 ENCOUNTER — Emergency Department (HOSPITAL_BASED_OUTPATIENT_CLINIC_OR_DEPARTMENT_OTHER): Payer: BC Managed Care – PPO | Admitting: Radiology

## 2021-10-05 ENCOUNTER — Other Ambulatory Visit: Payer: Self-pay

## 2021-10-05 DIAGNOSIS — J209 Acute bronchitis, unspecified: Secondary | ICD-10-CM | POA: Insufficient documentation

## 2021-10-05 DIAGNOSIS — Z8616 Personal history of COVID-19: Secondary | ICD-10-CM | POA: Diagnosis not present

## 2021-10-05 DIAGNOSIS — E119 Type 2 diabetes mellitus without complications: Secondary | ICD-10-CM | POA: Insufficient documentation

## 2021-10-05 DIAGNOSIS — Z7984 Long term (current) use of oral hypoglycemic drugs: Secondary | ICD-10-CM | POA: Diagnosis not present

## 2021-10-05 DIAGNOSIS — I10 Essential (primary) hypertension: Secondary | ICD-10-CM | POA: Insufficient documentation

## 2021-10-05 DIAGNOSIS — Z79899 Other long term (current) drug therapy: Secondary | ICD-10-CM | POA: Diagnosis not present

## 2021-10-05 DIAGNOSIS — R0602 Shortness of breath: Secondary | ICD-10-CM | POA: Diagnosis present

## 2021-10-05 LAB — BASIC METABOLIC PANEL
Anion gap: 14 (ref 5–15)
BUN: 24 mg/dL — ABNORMAL HIGH (ref 6–20)
CO2: 24 mmol/L (ref 22–32)
Calcium: 9.8 mg/dL (ref 8.9–10.3)
Chloride: 95 mmol/L — ABNORMAL LOW (ref 98–111)
Creatinine, Ser: 1.13 mg/dL — ABNORMAL HIGH (ref 0.44–1.00)
GFR, Estimated: 59 mL/min — ABNORMAL LOW (ref >60)
Glucose, Bld: 214 mg/dL — ABNORMAL HIGH (ref 70–99)
Potassium: 3.9 mmol/L (ref 3.5–5.1)
Sodium: 133 mmol/L — ABNORMAL LOW (ref 135–145)

## 2021-10-05 LAB — CBC
HCT: 37.9 % (ref 36.0–46.0)
Hemoglobin: 12.5 g/dL (ref 12.0–15.0)
MCH: 27.4 pg (ref 26.0–34.0)
MCHC: 33 g/dL (ref 30.0–36.0)
MCV: 83.1 fL (ref 80.0–100.0)
Platelets: 432 10*3/uL — ABNORMAL HIGH (ref 150–400)
RBC: 4.56 MIL/uL (ref 3.87–5.11)
RDW: 14.9 % (ref 11.5–15.5)
WBC: 9.2 10*3/uL (ref 4.0–10.5)
nRBC: 0 % (ref 0.0–0.2)

## 2021-10-05 LAB — TROPONIN I (HIGH SENSITIVITY): Troponin I (High Sensitivity): 2 ng/L (ref <18)

## 2021-10-05 NOTE — ED Triage Notes (Signed)
Pt arrives POV with shortness of breath beginning yesterday, noticing it has gotten worse today.  States she was tested positive for Covid last Monday and bronchitis on Tuesday.  States she is having a little chest tightness.

## 2021-10-06 ENCOUNTER — Emergency Department (HOSPITAL_BASED_OUTPATIENT_CLINIC_OR_DEPARTMENT_OTHER)
Admission: EM | Admit: 2021-10-06 | Discharge: 2021-10-06 | Disposition: A | Discharge status: Home / Self Care | Payer: BC Managed Care – PPO | Attending: Emergency Medicine | Admitting: Emergency Medicine

## 2021-10-06 DIAGNOSIS — J209 Acute bronchitis, unspecified: Secondary | ICD-10-CM

## 2021-10-06 HISTORY — DX: Type 2 diabetes mellitus without complications: E11.9

## 2021-10-06 LAB — TROPONIN I (HIGH SENSITIVITY): Troponin I (High Sensitivity): 2 ng/L (ref <18)

## 2021-10-06 LAB — D-DIMER, QUANTITATIVE: D-Dimer, Quant: 0.33 ug/mL-FEU (ref 0.00–0.50)

## 2021-10-06 MED ORDER — ALBUTEROL SULFATE (2.5 MG/3ML) 0.083% IN NEBU: 2.5000 mg | INHALATION_SOLUTION | RESPIRATORY_TRACT | 0 refills | Status: AC | PRN

## 2021-10-06 MED ORDER — PREDNISONE 50 MG PO TABS
60.0000 mg | ORAL_TABLET | Freq: Once | ORAL | Status: AC
  Administered 2021-10-06: 60 mg via ORAL
  Filled 2021-10-06: qty 1

## 2021-10-06 MED ORDER — PREDNISONE 50 MG PO TABS: 50.0000 mg | ORAL_TABLET | Freq: Every day | ORAL | 0 refills | Status: AC

## 2021-10-06 MED ORDER — IPRATROPIUM-ALBUTEROL 0.5-2.5 (3) MG/3ML IN SOLN
3.0000 mL | Freq: Once | RESPIRATORY_TRACT | Status: AC
  Administered 2021-10-06: 3 mL via RESPIRATORY_TRACT
  Filled 2021-10-06: qty 3

## 2021-10-06 MED ORDER — ALBUTEROL SULFATE HFA 108 (90 BASE) MCG/ACT IN AERS: 2.0000 | INHALATION_SPRAY | RESPIRATORY_TRACT | 0 refills | Status: AC | PRN

## 2021-10-06 NOTE — Discharge Instructions (Signed)
Return if you start running a high fever, or if cough is getting worse, or if breathing problems are getting worse.

## 2021-10-06 NOTE — ED Provider Notes (Signed)
[facility] Provider Note   CSN: [id number] Arrival date & time: 10/05/21  2150     History  Chief Complaint  Patient presents with   Shortness of Breath    Becky Vazquez is a 51 y.o. female.  The history is provided by the patient.  Shortness of Breath She has history of hypertension, diabetes and was diagnosed with COVID-19 1 week ago.  The next day, she was diagnosed with bronchitis and started on a course of prednisone.  She was doing well until she completed her course of prednisone.  Yesterday, she started having return of a nonproductive cough and she has noted some shortness of breath.  She denies fever, chills, sweats.  She denies any chest pain.  She denies any nausea, vomiting, diarrhea.  She denies arthralgias or myalgias.  She did use her daughter's nebulizer which did give her some temporary relief.  Of note, she has received 2 doses of the COVID vaccine, and has not received the influenza vaccine this year.   Home Medications Prior to Admission medications   Medication Sig Start Date End Date Taking? Authorizing Provider  atenolol (TENORMIN) 25 MG tablet Take 25 mg by mouth daily.   Yes [provider]  BIOTIN PO Take 1 tablet by mouth daily.   Yes [provider]  cyanocobalamin 2000 MCG tablet Take 2,000 mcg by mouth daily.   Yes [provider]  hydrochlorothiazide (HYDRODIURIL) 25 MG tablet Take 1 tablet by mouth daily. 01/09/21  Yes [provider]  JINTELI 1-5 MG-MCG TABS tablet Take 1 tablet by mouth daily. 11/05/17  Yes [provider]  lisinopril (PRINIVIL,ZESTRIL) 20 MG tablet Take 1 tablet by mouth daily. 10/21/14  Yes [provider]  metFORMIN (GLUCOPHAGE) 500 MG tablet Take 1 tablet by mouth 2 (two) times daily. 01/19/21  Yes [provider]  Multiple Vitamins-Minerals (WOMENS DAILY FORMULA PO) Take 1 tablet by mouth daily.   Yes [provider]  phentermine  37.5 MG capsule Take 37.5 mg by mouth every morning.   Yes [provider]  Minoxidil (ROGAINE WOMENS) 5 % FOAM See admin instructions. 07/14/15   [provider]      Allergies    Promethazine-dm    Review of Systems   Review of Systems  Respiratory:  Positive for shortness of breath.   All other systems reviewed and are negative.  Physical Exam Updated Vital Signs BP 136/85    Pulse 90    Temp 97.9 F (36.6 C)    Resp 18    Ht 5\' 4"  (1.626 m)    Wt 104.3 kg    LMP 02/28/2014    SpO2 96%    BMI 39.48 kg/m  Physical Exam Vitals and nursing note reviewed.  51 year old female, resting comfortably and in no acute distress. Vital signs are normal. Oxygen saturation is 96%, which is normal. Head is normocephalic and atraumatic. PERRLA, EOMI. Oropharynx is clear. Neck is nontender and supple without adenopathy or JVD. Back is nontender and there is no CVA tenderness. Lungs are clear without rales, wheezes, or rhonchi.  Exhalation phase is slightly prolonged.  Slight wheezes noted with coughing. Chest is nontender. Heart has regular rate and rhythm without murmur. Abdomen is soft, flat, nontender. Extremities have no cyanosis or edema, full range of motion is present. Skin is warm and dry without rash. Neurologic: Mental status is normal, cranial nerves are intact, moves all extremities equally.  ED Results / Procedures /  Treatments   Labs (all labs ordered are listed, but only abnormal results are displayed) Labs Reviewed  BASIC METABOLIC PANEL - Abnormal; Notable for the following components:      Result Value   Sodium 133 (*)    Chloride 95 (*)    Glucose, Bld 214 (*)    BUN 24 (*)    Creatinine, Ser 1.13 (*)    GFR, Estimated 59 (*)    All other components within normal limits  CBC - Abnormal; Notable for the following components:   Platelets 432 (*)    All other components within normal limits  D-DIMER, QUANTITATIVE  TROPONIN I (HIGH SENSITIVITY)   TROPONIN I (HIGH SENSITIVITY)    EKG None  Radiology DG Chest 2 View  Result Date: 10/05/2021 CLINICAL DATA:  Shortness of breath. Worsening today. Positive COVID test 1 week ago. EXAM: CHEST - 2 VIEW COMPARISON:  Portable chest 03/21/2020. FINDINGS: There is mild cardiomegaly. There is a low inspiration. There is asymmetric streaky atelectasis or infiltrate in the left lower lobe on both views, trace left pleural effusion. Remainder of the hypoinflated lungs are clear. No vascular congestion is seen. No right pleural effusion is evident, with mild chronic elevation of the right diaphragm. There are cholecystectomy clips in the right upper quadrant of the abdomen. Slight thoracic levoscoliosis. IMPRESSION: 1. Low inspiratory effort, with asymmetric streaky atelectasis or infiltrates in the left lower lobe, with trace left pleural effusion. Follow-up study recommended in full inspiration. 2. Mild cardiomegaly. Electronically Signed   By: Telford Nab M.D.   On: 10/05/2021 22:44    Procedures Procedures    Medications Ordered in ED Medications  ipratropium-albuterol (DUONEB) 0.5-2.5 (3) MG/3ML nebulizer solution 3 mL (has no administration in time range)  predniSONE (DELTASONE) tablet 60 mg (has no administration in time range)    ED Course/ Medical Decision Making/ A&P                           Medical Decision Making  Cough and dyspnea likely acute bronchitis secondary to COVID infection.  Recurrence of symptoms after completion of course of prednisone is strongly suggestive that she needs a longer course of steroids.  However, she is at increased risk for pulmonary embolism because of COVID infection, will screen with D-dimer.  Chest x-ray obtained here shows poor inhalation, but no obvious infiltrates.  I have independently viewed the images and agree with radiologist interpretation.  Labs show mild hyponatremia which is not felt to be clinically significant, mild renal insufficiency  unchanged from baseline.  WBC is normal but with a mild left shift.  Troponin is normal.  She will be given a therapeutic trial of albuterol with ipratropium, given another dose of prednisone.  Old records are reviewed, and she has no relevant past visits.  D-dimer is normal.  She feels significantly improved following albuterol with ipratropium.  She is given a dose of prednisone and is discharged with prescriptions for prednisone, albuterol inhaler, albuterol nebulizer solution.  Return precautions discussed.        Final Clinical Impression(s) / ED Diagnoses Final diagnoses:  Acute bronchitis, unspecified organism    Rx / DC Orders ED Discharge Orders          Ordered    predniSONE (DELTASONE) 50 MG tablet  Daily        10/06/21 0234    albuterol (VENTOLIN HFA) 108 (90 Base) MCG/ACT inhaler  Every 4 hours PRN  10/06/21 0234    albuterol (PROVENTIL) (2.5 MG/3ML) 0.083% nebulizer solution  Every 4 hours PRN        10/06/21 6940              Delora Fuel, MD 98/28/67 [phone]

## 2021-10-06 NOTE — ED Notes (Signed)
Pt verbalizes understanding of discharge instructions. Opportunity for questioning and answers were provided. Pt discharged from ED to home with daughter.    

## 2021-10-09 ENCOUNTER — Ambulatory Visit: Payer: Managed Care, Other (non HMO)

## 2021-11-03 ENCOUNTER — Encounter (HOSPITAL_COMMUNITY): Payer: Self-pay | Admitting: Radiology

## 2021-11-06 ENCOUNTER — Ambulatory Visit
Admission: RE | Admit: 2021-11-06 | Discharge: 2021-11-06 | Disposition: A | Discharge status: Home / Self Care | Payer: BC Managed Care – PPO | Source: Ambulatory Visit | Attending: Obstetrics and Gynecology | Admitting: Obstetrics and Gynecology

## 2021-11-06 DIAGNOSIS — Z1231 Encounter for screening mammogram for malignant neoplasm of breast: Secondary | ICD-10-CM

## 2022-10-28 ENCOUNTER — Other Ambulatory Visit: Payer: Self-pay | Admitting: Obstetrics and Gynecology

## 2022-10-28 DIAGNOSIS — Z1231 Encounter for screening mammogram for malignant neoplasm of breast: Secondary | ICD-10-CM

## 2022-12-09 ENCOUNTER — Ambulatory Visit
Admission: RE | Admit: 2022-12-09 | Discharge: 2022-12-09 | Disposition: A | Discharge status: Home / Self Care | Payer: BC Managed Care – PPO | Source: Ambulatory Visit | Attending: Obstetrics and Gynecology | Admitting: Obstetrics and Gynecology

## 2022-12-09 DIAGNOSIS — Z1231 Encounter for screening mammogram for malignant neoplasm of breast: Secondary | ICD-10-CM

## 2023-10-13 ENCOUNTER — Other Ambulatory Visit: Payer: Self-pay | Admitting: Internal Medicine

## 2023-10-13 DIAGNOSIS — Z1231 Encounter for screening mammogram for malignant neoplasm of breast: Secondary | ICD-10-CM

## 2023-12-16 ENCOUNTER — Ambulatory Visit
Admission: RE | Admit: 2023-12-16 | Discharge: 2023-12-16 | Disposition: A | Discharge status: Home / Self Care | Payer: BC Managed Care – PPO | Source: Ambulatory Visit | Attending: Internal Medicine | Admitting: Internal Medicine

## 2023-12-16 DIAGNOSIS — Z1231 Encounter for screening mammogram for malignant neoplasm of breast: Secondary | ICD-10-CM
